# Patient Record
Sex: Female | Born: 1935 | Race: White | Hispanic: No | Smoking: Never smoker
Health system: Southern US, Community
[De-identification: ages and names within clinical notes are randomized; demographics above are authoritative.]

## PROBLEM LIST (undated history)

## (undated) DIAGNOSIS — S62109A Fracture of unspecified carpal bone, unspecified wrist, initial encounter for closed fracture: Secondary | ICD-10-CM

## (undated) DIAGNOSIS — I4891 Unspecified atrial fibrillation: Secondary | ICD-10-CM

## (undated) DIAGNOSIS — I509 Heart failure, unspecified: Secondary | ICD-10-CM

## (undated) DIAGNOSIS — M4854XA Collapsed vertebra, not elsewhere classified, thoracic region, initial encounter for fracture: Secondary | ICD-10-CM

## (undated) DIAGNOSIS — Z66 Do not resuscitate: Secondary | ICD-10-CM

## (undated) DIAGNOSIS — E039 Hypothyroidism, unspecified: Secondary | ICD-10-CM

## (undated) DIAGNOSIS — Z7901 Long term (current) use of anticoagulants: Secondary | ICD-10-CM

## (undated) DIAGNOSIS — H3321 Serous retinal detachment, right eye: Secondary | ICD-10-CM

## (undated) DIAGNOSIS — I2510 Atherosclerotic heart disease of native coronary artery without angina pectoris: Secondary | ICD-10-CM

## (undated) HISTORY — DX: Unspecified atrial fibrillation: I48.91

## (undated) HISTORY — DX: Serous retinal detachment, right eye: H33.21

## (undated) HISTORY — DX: Collapsed vertebra, not elsewhere classified, thoracic region, initial encounter for fracture: M48.54XA

## (undated) HISTORY — DX: Fracture of unspecified carpal bone, unspecified wrist, initial encounter for closed fracture: S62.109A

## (undated) HISTORY — DX: Long term (current) use of anticoagulants: Z79.01

## (undated) HISTORY — PX: RETINAL DETACHMENT SURGERY: SHX105

## (undated) HISTORY — PX: BREAST EXCISIONAL BIOPSY: SUR124

## (undated) HISTORY — PX: BACK SURGERY: SHX140

## (undated) HISTORY — PX: CATARACT EXTRACTION: SUR2

## (undated) HISTORY — PX: SP KYPHOPLASTY: HXRAD454

---

## 1986-06-27 DIAGNOSIS — S62109A Fracture of unspecified carpal bone, unspecified wrist, initial encounter for closed fracture: Secondary | ICD-10-CM

## 1986-06-27 HISTORY — DX: Fracture of unspecified carpal bone, unspecified wrist, initial encounter for closed fracture: S62.109A

## 1986-06-27 HISTORY — PX: ORIF WRIST FRACTURE: SHX2133

## 1998-06-27 HISTORY — PX: COLONOSCOPY: SHX174

## 1999-11-24 ENCOUNTER — Encounter: Admission: RE | Admit: 1999-11-24 | Discharge: 1999-11-24 | Payer: Self-pay | Admitting: Internal Medicine

## 1999-11-24 ENCOUNTER — Encounter: Payer: Self-pay | Admitting: Internal Medicine

## 2000-11-27 ENCOUNTER — Encounter: Payer: Self-pay | Admitting: Internal Medicine

## 2000-11-27 ENCOUNTER — Encounter: Admission: RE | Admit: 2000-11-27 | Discharge: 2000-11-27 | Payer: Self-pay | Admitting: Internal Medicine

## 2001-11-29 ENCOUNTER — Encounter: Payer: Self-pay | Admitting: Internal Medicine

## 2001-11-29 ENCOUNTER — Encounter: Admission: RE | Admit: 2001-11-29 | Discharge: 2001-11-29 | Payer: Self-pay | Admitting: Internal Medicine

## 2002-12-12 ENCOUNTER — Encounter: Payer: Self-pay | Admitting: Internal Medicine

## 2002-12-12 ENCOUNTER — Encounter: Admission: RE | Admit: 2002-12-12 | Discharge: 2002-12-12 | Payer: Self-pay | Admitting: Internal Medicine

## 2003-09-01 ENCOUNTER — Inpatient Hospital Stay (HOSPITAL_COMMUNITY): Admission: RE | Admit: 2003-09-01 | Discharge: 2003-09-04 | Payer: Self-pay | Admitting: Orthopedic Surgery

## 2003-09-04 ENCOUNTER — Inpatient Hospital Stay: Admission: AD | Admit: 2003-09-04 | Discharge: 2003-09-11 | Payer: Self-pay | Admitting: Internal Medicine

## 2003-09-22 ENCOUNTER — Encounter (HOSPITAL_COMMUNITY): Admission: RE | Admit: 2003-09-22 | Discharge: 2003-10-22 | Payer: Self-pay | Admitting: Orthopedic Surgery

## 2004-02-26 ENCOUNTER — Ambulatory Visit (HOSPITAL_COMMUNITY): Admission: RE | Admit: 2004-02-26 | Discharge: 2004-02-26 | Payer: Self-pay | Admitting: Internal Medicine

## 2004-06-27 HISTORY — PX: TOTAL KNEE ARTHROPLASTY: SHX125

## 2005-02-17 ENCOUNTER — Emergency Department (HOSPITAL_COMMUNITY): Admission: EM | Admit: 2005-02-17 | Discharge: 2005-02-17 | Payer: Self-pay | Admitting: Emergency Medicine

## 2005-03-02 ENCOUNTER — Encounter: Admission: RE | Admit: 2005-03-02 | Discharge: 2005-03-02 | Payer: Self-pay | Admitting: Specialist

## 2005-03-03 ENCOUNTER — Encounter: Admission: RE | Admit: 2005-03-03 | Discharge: 2005-03-03 | Payer: Self-pay | Admitting: Specialist

## 2005-03-11 ENCOUNTER — Ambulatory Visit (HOSPITAL_COMMUNITY): Admission: RE | Admit: 2005-03-11 | Discharge: 2005-03-11 | Payer: Self-pay | Admitting: Internal Medicine

## 2005-03-23 ENCOUNTER — Encounter: Admission: RE | Admit: 2005-03-23 | Discharge: 2005-03-23 | Payer: Self-pay | Admitting: Specialist

## 2005-07-19 ENCOUNTER — Encounter (INDEPENDENT_AMBULATORY_CARE_PROVIDER_SITE_OTHER): Payer: Self-pay | Admitting: Internal Medicine

## 2005-12-09 ENCOUNTER — Ambulatory Visit: Payer: Self-pay | Admitting: Internal Medicine

## 2006-01-03 ENCOUNTER — Encounter (INDEPENDENT_AMBULATORY_CARE_PROVIDER_SITE_OTHER): Payer: Self-pay | Admitting: Internal Medicine

## 2006-01-06 ENCOUNTER — Ambulatory Visit: Payer: Self-pay | Admitting: Internal Medicine

## 2006-01-10 ENCOUNTER — Ambulatory Visit: Payer: Self-pay | Admitting: Internal Medicine

## 2006-01-24 ENCOUNTER — Ambulatory Visit: Payer: Self-pay | Admitting: Internal Medicine

## 2006-01-31 ENCOUNTER — Ambulatory Visit: Payer: Self-pay | Admitting: Internal Medicine

## 2006-01-31 LAB — CONVERTED CEMR LAB: aPTT: 18.1 s

## 2006-02-14 ENCOUNTER — Ambulatory Visit: Payer: Self-pay | Admitting: Family Medicine

## 2006-02-14 ENCOUNTER — Encounter (INDEPENDENT_AMBULATORY_CARE_PROVIDER_SITE_OTHER): Payer: Self-pay | Admitting: Internal Medicine

## 2006-02-14 LAB — CONVERTED CEMR LAB: INR: 1.8

## 2006-03-02 ENCOUNTER — Ambulatory Visit: Payer: Self-pay | Admitting: Family Medicine

## 2006-03-02 ENCOUNTER — Encounter (INDEPENDENT_AMBULATORY_CARE_PROVIDER_SITE_OTHER): Payer: Self-pay | Admitting: Internal Medicine

## 2006-03-09 ENCOUNTER — Ambulatory Visit: Payer: Self-pay | Admitting: Family Medicine

## 2006-03-09 ENCOUNTER — Encounter (INDEPENDENT_AMBULATORY_CARE_PROVIDER_SITE_OTHER): Payer: Self-pay | Admitting: Internal Medicine

## 2006-03-15 ENCOUNTER — Encounter (INDEPENDENT_AMBULATORY_CARE_PROVIDER_SITE_OTHER): Payer: Self-pay | Admitting: Internal Medicine

## 2006-03-16 ENCOUNTER — Ambulatory Visit (HOSPITAL_COMMUNITY): Admission: RE | Admit: 2006-03-16 | Discharge: 2006-03-16 | Payer: Self-pay | Admitting: Internal Medicine

## 2006-03-22 ENCOUNTER — Encounter (INDEPENDENT_AMBULATORY_CARE_PROVIDER_SITE_OTHER): Payer: Self-pay | Admitting: Internal Medicine

## 2006-03-31 ENCOUNTER — Ambulatory Visit: Payer: Self-pay | Admitting: Internal Medicine

## 2006-03-31 LAB — CONVERTED CEMR LAB: INR: 2.6

## 2006-04-28 ENCOUNTER — Ambulatory Visit: Payer: Self-pay | Admitting: Internal Medicine

## 2006-05-22 ENCOUNTER — Encounter: Payer: Self-pay | Admitting: Internal Medicine

## 2006-05-22 DIAGNOSIS — M810 Age-related osteoporosis without current pathological fracture: Secondary | ICD-10-CM | POA: Insufficient documentation

## 2006-05-22 DIAGNOSIS — H3320 Serous retinal detachment, unspecified eye: Secondary | ICD-10-CM | POA: Insufficient documentation

## 2006-05-22 DIAGNOSIS — S62109A Fracture of unspecified carpal bone, unspecified wrist, initial encounter for closed fracture: Secondary | ICD-10-CM | POA: Insufficient documentation

## 2006-05-22 DIAGNOSIS — H269 Unspecified cataract: Secondary | ICD-10-CM | POA: Insufficient documentation

## 2006-05-22 DIAGNOSIS — S22000A Wedge compression fracture of unspecified thoracic vertebra, initial encounter for closed fracture: Secondary | ICD-10-CM

## 2006-05-30 ENCOUNTER — Ambulatory Visit: Payer: Self-pay | Admitting: Family Medicine

## 2006-05-30 ENCOUNTER — Encounter (INDEPENDENT_AMBULATORY_CARE_PROVIDER_SITE_OTHER): Payer: Self-pay | Admitting: Internal Medicine

## 2006-06-28 ENCOUNTER — Ambulatory Visit: Payer: Self-pay | Admitting: Internal Medicine

## 2006-08-02 ENCOUNTER — Ambulatory Visit: Payer: Self-pay | Admitting: Internal Medicine

## 2006-08-02 LAB — CONVERTED CEMR LAB: INR: 1.8

## 2006-08-17 ENCOUNTER — Ambulatory Visit: Payer: Self-pay | Admitting: Family Medicine

## 2006-08-17 LAB — CONVERTED CEMR LAB: INR: 2.9

## 2006-08-18 DIAGNOSIS — I4891 Unspecified atrial fibrillation: Secondary | ICD-10-CM

## 2006-09-13 ENCOUNTER — Ambulatory Visit: Payer: Self-pay | Admitting: Internal Medicine

## 2006-09-13 LAB — CONVERTED CEMR LAB
Hemoglobin: 12.3 g/dL
INR: 2.4
Prothrombin Time: 18.8 s

## 2006-10-12 ENCOUNTER — Ambulatory Visit: Payer: Self-pay | Admitting: Internal Medicine

## 2006-10-12 LAB — CONVERTED CEMR LAB: INR: 2.4

## 2006-11-09 ENCOUNTER — Ambulatory Visit: Payer: Self-pay | Admitting: Internal Medicine

## 2006-12-07 ENCOUNTER — Ambulatory Visit: Payer: Self-pay | Admitting: Internal Medicine

## 2006-12-07 LAB — CONVERTED CEMR LAB
INR: 1.9
Prothrombin Time: 17 s

## 2006-12-22 ENCOUNTER — Ambulatory Visit: Payer: Self-pay | Admitting: Family Medicine

## 2007-01-19 ENCOUNTER — Ambulatory Visit: Payer: Self-pay | Admitting: Internal Medicine

## 2007-01-19 LAB — CONVERTED CEMR LAB
INR: 2.4
Prothrombin Time: 18.8 s

## 2007-02-16 ENCOUNTER — Ambulatory Visit: Payer: Self-pay | Admitting: Internal Medicine

## 2007-02-16 LAB — CONVERTED CEMR LAB: Prothrombin Time: 18 s

## 2007-03-16 ENCOUNTER — Telehealth (INDEPENDENT_AMBULATORY_CARE_PROVIDER_SITE_OTHER): Payer: Self-pay | Admitting: *Deleted

## 2007-03-30 ENCOUNTER — Ambulatory Visit: Payer: Self-pay | Admitting: Internal Medicine

## 2007-03-30 DIAGNOSIS — R252 Cramp and spasm: Secondary | ICD-10-CM | POA: Insufficient documentation

## 2007-03-30 LAB — CONVERTED CEMR LAB
INR: 1.7
Prothrombin Time: 15.8 s

## 2007-04-02 ENCOUNTER — Telehealth (INDEPENDENT_AMBULATORY_CARE_PROVIDER_SITE_OTHER): Payer: Self-pay | Admitting: *Deleted

## 2007-04-02 LAB — CONVERTED CEMR LAB
Basophils Absolute: 0 10*3/uL (ref 0.0–0.1)
Calcium: 8.9 mg/dL (ref 8.4–10.5)
Glucose, Bld: 84 mg/dL (ref 70–99)
HCT: 39.1 % (ref 36.0–46.0)
Lymphocytes Relative: 32 % (ref 12–46)
Lymphs Abs: 1.9 10*3/uL (ref 0.7–3.3)
MCHC: 32.2 g/dL (ref 30.0–36.0)
Monocytes Absolute: 0.7 10*3/uL (ref 0.2–0.7)
Neutro Abs: 3.2 10*3/uL (ref 1.7–7.7)
Platelets: 263 10*3/uL (ref 150–400)
RBC: 4.03 M/uL (ref 3.87–5.11)

## 2007-04-05 ENCOUNTER — Ambulatory Visit (HOSPITAL_COMMUNITY): Admission: RE | Admit: 2007-04-05 | Discharge: 2007-04-05 | Payer: Self-pay | Admitting: Internal Medicine

## 2007-04-05 ENCOUNTER — Encounter (INDEPENDENT_AMBULATORY_CARE_PROVIDER_SITE_OTHER): Payer: Self-pay | Admitting: Internal Medicine

## 2007-04-06 ENCOUNTER — Ambulatory Visit: Payer: Self-pay | Admitting: Internal Medicine

## 2007-04-06 LAB — CONVERTED CEMR LAB: INR: 1.2

## 2007-04-20 ENCOUNTER — Ambulatory Visit: Payer: Self-pay | Admitting: Internal Medicine

## 2007-04-20 LAB — CONVERTED CEMR LAB
INR: 1.8
Prothrombin Time: 16.7 s

## 2007-04-29 ENCOUNTER — Encounter (INDEPENDENT_AMBULATORY_CARE_PROVIDER_SITE_OTHER): Payer: Self-pay | Admitting: Internal Medicine

## 2007-05-04 ENCOUNTER — Ambulatory Visit: Payer: Self-pay | Admitting: Internal Medicine

## 2007-05-04 LAB — CONVERTED CEMR LAB
INR: 2.6
Prothrombin Time: 19.7 s

## 2007-05-30 ENCOUNTER — Ambulatory Visit: Payer: Self-pay | Admitting: Internal Medicine

## 2007-05-30 LAB — CONVERTED CEMR LAB: INR: 2.4

## 2007-06-29 ENCOUNTER — Ambulatory Visit: Payer: Self-pay | Admitting: Internal Medicine

## 2007-07-20 ENCOUNTER — Ambulatory Visit: Payer: Self-pay | Admitting: Internal Medicine

## 2007-07-20 ENCOUNTER — Telehealth (INDEPENDENT_AMBULATORY_CARE_PROVIDER_SITE_OTHER): Payer: Self-pay | Admitting: Internal Medicine

## 2007-07-20 LAB — CONVERTED CEMR LAB: Prothrombin Time: 48.5 s — ABNORMAL HIGH (ref 11.6–15.2)

## 2007-08-03 ENCOUNTER — Ambulatory Visit: Payer: Self-pay | Admitting: Internal Medicine

## 2007-08-31 ENCOUNTER — Ambulatory Visit: Payer: Self-pay | Admitting: Internal Medicine

## 2007-08-31 LAB — CONVERTED CEMR LAB: Prothrombin Time: 18.2 s

## 2007-09-12 ENCOUNTER — Ambulatory Visit: Payer: Self-pay | Admitting: Family Medicine

## 2007-09-12 DIAGNOSIS — S0010XA Contusion of unspecified eyelid and periocular area, initial encounter: Secondary | ICD-10-CM | POA: Insufficient documentation

## 2007-09-28 ENCOUNTER — Ambulatory Visit: Payer: Self-pay | Admitting: Internal Medicine

## 2007-09-28 LAB — CONVERTED CEMR LAB
INR: 1.9
Prothrombin Time: 16.8 s

## 2007-10-19 ENCOUNTER — Ambulatory Visit: Payer: Self-pay | Admitting: Internal Medicine

## 2007-10-19 LAB — CONVERTED CEMR LAB: INR: 1.4

## 2007-11-02 ENCOUNTER — Ambulatory Visit: Payer: Self-pay | Admitting: Internal Medicine

## 2007-11-02 LAB — CONVERTED CEMR LAB: INR: 3.4

## 2007-11-16 ENCOUNTER — Telehealth (INDEPENDENT_AMBULATORY_CARE_PROVIDER_SITE_OTHER): Payer: Self-pay | Admitting: *Deleted

## 2007-11-22 ENCOUNTER — Ambulatory Visit: Payer: Self-pay | Admitting: Internal Medicine

## 2007-11-22 LAB — CONVERTED CEMR LAB: INR: 2.3

## 2007-12-17 ENCOUNTER — Ambulatory Visit: Payer: Self-pay | Admitting: Internal Medicine

## 2007-12-17 DIAGNOSIS — J069 Acute upper respiratory infection, unspecified: Secondary | ICD-10-CM | POA: Insufficient documentation

## 2007-12-21 ENCOUNTER — Ambulatory Visit: Payer: Self-pay | Admitting: Internal Medicine

## 2007-12-21 LAB — CONVERTED CEMR LAB
Hemoglobin: 13.2 g/dL
INR: 2.1

## 2008-01-17 ENCOUNTER — Ambulatory Visit: Payer: Self-pay | Admitting: Family Medicine

## 2008-01-31 ENCOUNTER — Ambulatory Visit: Payer: Self-pay | Admitting: Internal Medicine

## 2008-01-31 LAB — CONVERTED CEMR LAB
INR: 3
Prothrombin Time: 21 s

## 2008-02-15 ENCOUNTER — Ambulatory Visit: Payer: Self-pay | Admitting: Internal Medicine

## 2008-02-15 DIAGNOSIS — N390 Urinary tract infection, site not specified: Secondary | ICD-10-CM

## 2008-02-15 LAB — CONVERTED CEMR LAB
Glucose, Urine, Semiquant: NEGATIVE
Ketones, urine, test strip: NEGATIVE
Nitrite: POSITIVE
Protein, U semiquant: NEGATIVE
Specific Gravity, Urine: 1.015
Urobilinogen, UA: 0.2
pH: 5.5

## 2008-02-22 ENCOUNTER — Ambulatory Visit: Payer: Self-pay | Admitting: Internal Medicine

## 2008-02-22 DIAGNOSIS — T887XXA Unspecified adverse effect of drug or medicament, initial encounter: Secondary | ICD-10-CM | POA: Insufficient documentation

## 2008-03-07 ENCOUNTER — Ambulatory Visit: Payer: Self-pay | Admitting: Internal Medicine

## 2008-03-20 ENCOUNTER — Ambulatory Visit: Payer: Self-pay | Admitting: Internal Medicine

## 2008-03-20 LAB — CONVERTED CEMR LAB: INR: 3.2

## 2008-03-21 ENCOUNTER — Encounter (INDEPENDENT_AMBULATORY_CARE_PROVIDER_SITE_OTHER): Payer: Self-pay | Admitting: Internal Medicine

## 2008-04-03 ENCOUNTER — Ambulatory Visit: Payer: Self-pay | Admitting: Internal Medicine

## 2008-04-08 ENCOUNTER — Encounter (INDEPENDENT_AMBULATORY_CARE_PROVIDER_SITE_OTHER): Payer: Self-pay | Admitting: Internal Medicine

## 2008-04-24 ENCOUNTER — Ambulatory Visit: Payer: Self-pay | Admitting: Internal Medicine

## 2008-04-24 LAB — CONVERTED CEMR LAB: INR: 3.6

## 2008-04-25 ENCOUNTER — Encounter (INDEPENDENT_AMBULATORY_CARE_PROVIDER_SITE_OTHER): Payer: Self-pay | Admitting: Internal Medicine

## 2008-05-15 ENCOUNTER — Ambulatory Visit: Payer: Self-pay | Admitting: Internal Medicine

## 2008-05-15 LAB — CONVERTED CEMR LAB: INR: 2.7

## 2008-06-12 ENCOUNTER — Ambulatory Visit: Payer: Self-pay | Admitting: Internal Medicine

## 2008-07-10 ENCOUNTER — Ambulatory Visit: Payer: Self-pay | Admitting: Internal Medicine

## 2008-07-10 LAB — CONVERTED CEMR LAB: INR: 2.9

## 2008-07-17 ENCOUNTER — Ambulatory Visit: Payer: Self-pay | Admitting: Internal Medicine

## 2008-07-17 DIAGNOSIS — M25569 Pain in unspecified knee: Secondary | ICD-10-CM

## 2008-07-18 ENCOUNTER — Telehealth (INDEPENDENT_AMBULATORY_CARE_PROVIDER_SITE_OTHER): Payer: Self-pay | Admitting: *Deleted

## 2008-08-07 ENCOUNTER — Ambulatory Visit: Payer: Self-pay | Admitting: Internal Medicine

## 2008-08-07 LAB — CONVERTED CEMR LAB
INR: 3.8
Nitrite: NEGATIVE
Specific Gravity, Urine: 1.01
WBC Urine, dipstick: NEGATIVE

## 2008-08-08 ENCOUNTER — Encounter (INDEPENDENT_AMBULATORY_CARE_PROVIDER_SITE_OTHER): Payer: Self-pay | Admitting: Internal Medicine

## 2008-08-08 DIAGNOSIS — R300 Dysuria: Secondary | ICD-10-CM | POA: Insufficient documentation

## 2008-08-21 ENCOUNTER — Ambulatory Visit: Payer: Self-pay | Admitting: Internal Medicine

## 2008-09-15 ENCOUNTER — Ambulatory Visit: Payer: Self-pay | Admitting: Internal Medicine

## 2008-09-15 LAB — CONVERTED CEMR LAB: INR: 2.9

## 2008-10-16 ENCOUNTER — Ambulatory Visit: Payer: Self-pay | Admitting: Internal Medicine

## 2008-10-16 LAB — CONVERTED CEMR LAB: INR: 2.6

## 2008-11-13 ENCOUNTER — Ambulatory Visit: Payer: Self-pay | Admitting: Internal Medicine

## 2008-11-17 ENCOUNTER — Encounter (INDEPENDENT_AMBULATORY_CARE_PROVIDER_SITE_OTHER): Payer: Self-pay | Admitting: Internal Medicine

## 2008-12-18 ENCOUNTER — Ambulatory Visit: Payer: Self-pay | Admitting: Internal Medicine

## 2009-01-15 ENCOUNTER — Ambulatory Visit: Payer: Self-pay | Admitting: Internal Medicine

## 2009-01-15 LAB — CONVERTED CEMR LAB: INR: 3.5

## 2009-01-22 ENCOUNTER — Encounter (INDEPENDENT_AMBULATORY_CARE_PROVIDER_SITE_OTHER): Payer: Self-pay | Admitting: Internal Medicine

## 2010-07-18 ENCOUNTER — Encounter: Payer: Self-pay | Admitting: Internal Medicine

## 2010-08-18 ENCOUNTER — Ambulatory Visit (HOSPITAL_COMMUNITY)
Admission: RE | Admit: 2010-08-18 | Discharge: 2010-08-18 | Disposition: A | Discharge status: Home / Self Care | Payer: Medicare Other | Source: Ambulatory Visit | Attending: Internal Medicine | Admitting: Internal Medicine

## 2010-08-18 ENCOUNTER — Other Ambulatory Visit (HOSPITAL_COMMUNITY): Payer: Self-pay | Admitting: Internal Medicine

## 2010-08-18 DIAGNOSIS — W19XXXA Unspecified fall, initial encounter: Secondary | ICD-10-CM

## 2010-08-18 DIAGNOSIS — M949 Disorder of cartilage, unspecified: Secondary | ICD-10-CM | POA: Insufficient documentation

## 2010-08-18 DIAGNOSIS — M79609 Pain in unspecified limb: Secondary | ICD-10-CM | POA: Insufficient documentation

## 2010-08-18 DIAGNOSIS — R918 Other nonspecific abnormal finding of lung field: Secondary | ICD-10-CM | POA: Insufficient documentation

## 2010-08-18 DIAGNOSIS — M899 Disorder of bone, unspecified: Secondary | ICD-10-CM | POA: Insufficient documentation

## 2010-08-18 DIAGNOSIS — M25519 Pain in unspecified shoulder: Secondary | ICD-10-CM | POA: Insufficient documentation

## 2010-10-14 ENCOUNTER — Ambulatory Visit (INDEPENDENT_AMBULATORY_CARE_PROVIDER_SITE_OTHER): Payer: Medicare Other | Admitting: Cardiology

## 2010-10-14 ENCOUNTER — Encounter: Payer: Self-pay | Admitting: Cardiology

## 2010-10-14 VITALS — BP 123/59 | HR 110 | Ht 64.0 in | Wt 131.0 lb

## 2010-10-14 DIAGNOSIS — Z7901 Long term (current) use of anticoagulants: Secondary | ICD-10-CM

## 2010-10-14 DIAGNOSIS — R2681 Unsteadiness on feet: Secondary | ICD-10-CM | POA: Insufficient documentation

## 2010-10-14 DIAGNOSIS — R42 Dizziness and giddiness: Secondary | ICD-10-CM

## 2010-10-14 DIAGNOSIS — R269 Unspecified abnormalities of gait and mobility: Secondary | ICD-10-CM

## 2010-10-14 DIAGNOSIS — I4891 Unspecified atrial fibrillation: Secondary | ICD-10-CM

## 2010-10-14 NOTE — Assessment & Plan Note (Signed)
Patient has done well with full anticoagulation and has a significant risk for thromboembolism in the absence of anticoagulation, but is a suboptimal candidate at present due to her advanced age and multiple falls.  I would like to diagnose the reason for her gait problems and determine how much could be done to prevent falls before declaring that she is not a candidate for safe anticoagulation.  For now, warfarin will be continued.

## 2010-10-14 NOTE — Progress Notes (Signed)
HPI:  Kim [name] is a remarkably sharp and delightful 77 year old woman referred by Dr. Catalina Pizza for evaluation of dizziness.  This nice woman has enjoyed generally excellent health.  She has had atrial fibrillation for a number of years, has been maintained on anticoagulants, but has not apparently previously been evaluated by a cardiologist nor undergone any significant cardiac testing.  She experiences no palpitations, dyspnea, chest discomfort, pedal edema, orthopnea or PND.  Heart rate has been well-controlled with a modest dose of beta blocker.  Anticoagulation with warfarin has been stable and therapeutic without complications.  Unfortunately, she has suffered a number of falls in recent months.  In each case, she recalls tripping or losing her balance, denying any loss of consciousness.  Nonetheless, she describes "dizziness" , which is poorly characterized.  She best describes this as a sense of heaviness in her head that typically occurs when she is standing or walking, but is not always present.  Her gait is unsteady, and she now walks with a cane.  She has arthritis in her lower extremities, but has no known neurologic problems.  She apparently has osteoporosis and has suffered at least one compression vertebral fracture requiring kyphoplasty performed by one of the radiologists.  Current Outpatient Prescriptions on File Prior to Visit  Medication Sig Dispense Refill  . Ascorbic Acid (VITAMIN C) 500 MG tablet Take 500 mg by mouth daily.        . chlorpheniramine-HYDROcodone (TUSSIONEX PENNKINETIC ER) 10-8 MG/5ML LQCR Take 5 mLs by mouth.        . metoprolol (TOPROL-XL) 50 MG 24 hr tablet Take 50 mg by mouth daily. Take 1/2 tab daily       . Multiple Vitamins-Minerals (MULTIVITAMIN WITH MINERALS) tablet Take 1 tablet by mouth daily.        Marland Kitchen warfarin (COUMADIN) 3 MG tablet Take 3 mg by mouth daily.        Marland Kitchen DISCONTD: nitrofurantoin, macrocrystal-monohydrate, (MACROBID) 100 MG capsule Take  100 mg by mouth 2 (two) times daily.        Marland Kitchen DISCONTD: zoster vaccine live, PF, (ZOSTAVAX) 16109 UNT/0.65ML injection Inject 0.65 mLs into the skin once.          Allergies  Allergen Reactions  . Nitrofurantoin     REACTION: rash      Past Medical History  Diagnosis Date  . Atrial fibrillation   . Osteoporosis     Knee; hip  . Wrist fracture 1988    Left  . Detached retina, right   . Chronic anticoagulation   . Compression fracture of thoracic spine, non-traumatic      Past Surgical History  Procedure Date  . Kyphosis surgery     radiographic procedure with bone cement  . Retinal detachment surgery   . Cataract extraction     bilaterial  . Total knee arthroplasty 2006    Left  . Breast excisional biopsy 1954; 1968    Benign disease  . Orif wrist fracture 1988  . Colonoscopy 2000     Family History  Problem Relation Age of Onset  . Heart disease Father     deceased age 50  . Heart attack Mother      History   Social History  . Marital Status: Married    Spouse Name: N/A    Number of Children: N/A  . Years of Education: N/A   Occupational History  . retail sales     retired   Social History Main  Topics  . Smoking status: Never Smoker   . Smokeless tobacco: Never Used  . Alcohol Use: No  . Drug Use: No  . Sexually Active:    Other Topics Concern  . Not on file   Social History Narrative  . No narrative on file     ROS: Patient requires corrective lenses and has undergone bilateral cataract extractions.  She follows no dietary restrictions and has stable weight and appetite.  She notes occasional mild ankle edema.   All other systems reviewed and are negative.  PHYSICAL EXAM: BP 123/59  Pulse 110  Ht 5\' 4"  (1.626 m)  Wt 131 lb (59.421 kg)  BMI 22.49 kg/m2  SpO2 97% ; no significant orthostatic change in blood pressure General-Well-developed; no acute distress; proportionate weight and height HEENT-[place]/AT; PERRL; EOM intact; conjunctiva and  lids nl Neck-No JVD; no carotid bruits Endocrine-No thyromegaly Lungs-No tachypnea, clear without rales, rhonchi or wheezes Cardiovascular- normal PMI; normal S1 and S2; minimal systolic murmur; irregular rhythm Abdomen-BS normal; soft and non-tender without masses or organomegaly Musculoskeletal-No deformities, cyanosis or clubbing Neurologic-Nl cranial nerves; slight weakness of the left lower extremity; non-fluid gait with shuffling of her feet and very small steps; negative Romberg Skin- Warm, no sig. Lesions Extremities-Nl distal pulses; no edema  EKG:   Atrial fibrillation with a controlled ventricular response; heart rate is 95 bpm; right ventricular conduction delay; minor nonspecific ST-T wave abnormality; no previous tracing for comparison.  ASSESSMENT AND PLAN:

## 2010-10-14 NOTE — Assessment & Plan Note (Signed)
Arrhythmias long standing and apparently does not reflect underlying structural heart disease.  An echocardiogram and TSH level will be obtained.

## 2010-10-14 NOTE — Patient Instructions (Signed)
Your physician recommends that you schedule a follow-up appointment in: 1 month *Your physician recommends that you return for lab work in: today Stool cards for home: please follow instructions  In the packet MRI of brain for dizziness Physical therapy-for gait training

## 2010-10-15 LAB — CBC WITH DIFFERENTIAL/PLATELET
Basophils Absolute: 0 10*3/uL (ref 0.0–0.1)
Eosinophils Relative: 2 % (ref 0–5)
HCT: 37.1 % (ref 36.0–46.0)
Lymphocytes Relative: 24 % (ref 12–46)
MCH: 31.9 pg (ref 26.0–34.0)
MCHC: 33.2 g/dL (ref 30.0–36.0)
MCV: 96.1 fL (ref 78.0–100.0)
Monocytes Absolute: 0.7 10*3/uL (ref 0.1–1.0)
RDW: 14.4 % (ref 11.5–15.5)
WBC: 7.3 10*3/uL (ref 4.0–10.5)

## 2010-10-15 LAB — COMPREHENSIVE METABOLIC PANEL
AST: 24 U/L (ref 0–37)
BUN: 18 mg/dL (ref 6–23)
CO2: 26 mEq/L (ref 19–32)
Calcium: 9.6 mg/dL (ref 8.4–10.5)
Chloride: 99 mEq/L (ref 96–112)
Creat: 0.78 mg/dL (ref 0.40–1.20)

## 2010-10-18 ENCOUNTER — Other Ambulatory Visit (HOSPITAL_COMMUNITY): Payer: Medicare Other

## 2010-10-20 ENCOUNTER — Ambulatory Visit (HOSPITAL_COMMUNITY)
Admission: RE | Admit: 2010-10-20 | Discharge: 2010-10-20 | Disposition: A | Discharge status: Home / Self Care | Payer: Medicare Other | Source: Ambulatory Visit | Attending: Cardiology | Admitting: Cardiology

## 2010-10-20 DIAGNOSIS — Z7901 Long term (current) use of anticoagulants: Secondary | ICD-10-CM | POA: Insufficient documentation

## 2010-10-20 DIAGNOSIS — R42 Dizziness and giddiness: Secondary | ICD-10-CM

## 2010-10-20 DIAGNOSIS — Z96659 Presence of unspecified artificial knee joint: Secondary | ICD-10-CM | POA: Insufficient documentation

## 2010-10-22 ENCOUNTER — Encounter: Payer: Self-pay | Admitting: *Deleted

## 2010-10-22 ENCOUNTER — Telehealth: Payer: Self-pay | Admitting: *Deleted

## 2010-10-22 ENCOUNTER — Telehealth: Payer: Self-pay | Admitting: Cardiology

## 2010-10-22 NOTE — Telephone Encounter (Signed)
Patient would like results of "brain scan" done on Tuesday / tg

## 2010-10-22 NOTE — Telephone Encounter (Signed)
Verbalized understanding

## 2010-10-22 NOTE — Telephone Encounter (Signed)
Results called to pt, verbalized understanding

## 2010-10-22 NOTE — Telephone Encounter (Signed)
Results to pt

## 2010-10-22 NOTE — Progress Notes (Signed)
Results called to pt

## 2010-10-22 NOTE — Progress Notes (Signed)
Result letter mailed to pt.

## 2010-10-25 ENCOUNTER — Encounter (INDEPENDENT_AMBULATORY_CARE_PROVIDER_SITE_OTHER): Payer: Medicare Other

## 2010-10-25 DIAGNOSIS — R195 Other fecal abnormalities: Secondary | ICD-10-CM

## 2010-11-12 NOTE — H&P (Signed)
NAMEMarland [name]  Kim, [name]                         ACCOUNT NO.:  [id number]   MEDICAL RECORD NO.:  [id number]                   PATIENT TYPE:  INP   LOCATION:  0477                                 FACILITY:  [facility]   PHYSICIAN:  Ollen Gross, M.D.                 DATE OF BIRTH:  [birth year]   DATE OF ADMISSION:  09/01/2003  DATE OF DISCHARGE:                                HISTORY & PHYSICAL   CHIEF COMPLAINT:  Left knee pain.   HISTORY OF PRESENT ILLNESS:  The patient is an 77 year old female seen by  Dr. Lequita Halt for ongoing progressive history of left knee pain and  dysfunction.  She does not recall any specific injury or accident.  She has  had some occasional discomfort over the past couple of years, however, over  the past six to nine months it has become much more progressive.  She is at  a stage now where she is having pain all the time and even at night.  She  has undergone Cortisone injections which have not provided any relief.  She  was seen in the office where x-rays show bone-on-bone changes of the medial  compartment, as well as patellofemoral compartment.  She is felt to have end-  stage arthritis and has been refractory to non-operative management.  Felt  she would benefit from undergoing surgical intervention.  Risks and benefits  discussed.  The patient is subsequently admitted to the hospital.   ALLERGIES:  No known drug allergies.   CURRENT MEDICATIONS:  1. Toprol XL 50 mg 1/2 tablet daily.  2. Coumadin 3.5 mg Monday, Wednesday, and Friday, 3 mg on Tuesday, Thursday,     Saturday, and Sunday.   PAST MEDICAL HISTORY:  1. Heart arrhythmia.  2. Chronic Coumadin therapy.  3. A left wrist fracture back in 1988.   PAST SURGICAL HISTORY:  1. Breast lumpectomy x2 in 1954 and 1968, both were benign.  2. Left wrist surgery in 1998.   SOCIAL HISTORY:  Married, retired, nonsmoker, no alcohol.  Her husband will  be assisting with her care after surgery.   FAMILY  HISTORY:  Significant for heart disease in both of her parents.   REVIEW OF SYSTEMS:  GENERAL:  No fevers, chills, night sweats.  NEUROLOGIC:  No seizures, syncope, or paralysis.  RESPIRATORY:  No shortness of breath,  hemoptysis, or productive cough.  CARDIOVASCULAR:  No chest pain, angina, or  orthopnea.  GASTROINTESTINAL:  No nausea, vomiting, diarrhea, constipation.  GENITOURINARY:  She does have some occasional nocturia, no dysuria,  hematuria, or discharge.  MUSCULOSKELETAL:  Pertinent to that of the knee  found in the history of present illness.   PHYSICAL EXAMINATION:  VITAL SIGNS:  Pulse 84, respirations 12, blood  pressure 118/62.  GENERAL:  An 77 year old white female, well-developed, well-nourished, in no  acute distress.  She is alert, oriented, and cooperative, very pleasant,  accompanied by  her husband preoperatively.  HEENT:  Normocephalic, atraumatic.  Pupils equal, round, reactive.  Oropharynx clear.  NECK:  Supple, no carotid bruits.  CHEST:  Clear anterior and posterior chest walls to auscultation.  No  rhonchi, rales.  HEART:  Regular rate and rhythm with an occasional skipped beat versus a  short interval or irregular rhythm.  No murmurs.  ABDOMEN:  Soft, nontender, bowel sounds present.  RECTAL:  Not done, not pertinent to present illness.  BREASTS:  Not done, not pertinent to present illness.  GENITOURINARY:  Not done, not pertinent to present illness.  EXTREMITIES:  Significant to that of the left knee and leg.  She is noted to  have a varus deformity on exam.  Range of motion of 5 to 95 degrees.  Marked  crepitus is noted.  No instability.  She is tender medial and lateral joint  lines.   IMPRESSION:  1. Osteoarthritis, left knee.  2. Heart arrhythmia.  3. Chronic Coumadin therapy.   PLAN:  The patient will be admitted to [facility] to undergo a  left total knee replacement arthroplasty.  Surgery will be performed by Dr.  Ollen Gross.  Her  medical doctor is Dr. Lendell Caprice.  Dr. Lendell Caprice will be  notified and consulted if needed for any medical assistance with the patient  throughout the hospital course.     Alexzandrew L. Julien Girt, P.A.              Ollen Gross, M.D.    ALP/MEDQ  D:  09/02/2003  T:  09/03/2003  Job:  [id number]   cc:   Janae Bridgeman. Eloise Harman., M.D.  [address]  [place]  [place] [address]  Fax: [phone]

## 2010-11-12 NOTE — Op Note (Signed)
NAME:  Kim [name], Kim [name]                         ACCOUNT NO.:  [id number]   MEDICAL RECORD NO.:  [id number]                   PATIENT TYPE:  INP   LOCATION:  X010                                 FACILITY:  [facility]   PHYSICIAN:  Ollen Gross, M.D.                 DATE OF BIRTH:  Jul 06, 1917   DATE OF PROCEDURE:  09/01/2003  DATE OF DISCHARGE:                                 OPERATIVE REPORT   PREOPERATIVE DIAGNOSIS:  Osteoarthritis of the left knee.   POSTOPERATIVE DIAGNOSIS:  Osteoarthritis of the left knee.   PROCEDURE:  Left total knee arthroplasty.   SURGEON:  Ollen Gross, M.D.   ASSISTANT:  Alexzandrew L. Julien Girt, P.A.   ANESTHESIA:  General with postoperative Marcaine pain pump.   ESTIMATED BLOOD LOSS:  Minimal.   DRAINS:  Hemovac x1.   TOURNIQUET TIME:  43 minutes at 300 mmHg.   COMPLICATIONS:  None.   CONDITION:  Stable to recovery.   BRIEF CLINICAL NOTE:  The patient is an 77 year old female with severe end  stage osteoarthritis of the left knee with pain refractory to nonoperative  management.  She was then scheduled for a left total knee arthroplasty.   DESCRIPTION OF PROCEDURE:  After the successful administration of general  anesthetic the tourniquet was placed on the left thigh and the left lower  extremity prepped and draped in the usual sterile fashion.  The extremity  was wrapped with an Esmarch, the knee flexed and the tourniquet inflated to  300 mmHg.  A standard midline incision was made with a 10 blade through  subcutaneous tissue to the level of the extensor mechanism.  A fresh blade  was used to make a medial parapatellar arthrotomy and the soft tissue of the  proximal and medial tibia subperiosteally elevated the joint line with the  knife. We extended this with a curved osteotome.  The soft tissue over the  proximal and lateral tibia was elevated with attention being paid to the  window of the patellar tendon on the tibial tubercle.  The patella  was  everted and the knee flexed to 90 degrees and the ACL and PCL were removed.  A drill was used to create a starting hole in the distal femur.  The canal  was irrigated and the valgus alignment guide was placed.  The area  traversing off the posterior condylar rotation was marked and a block  pinned to remove 10 mm of the distal femur.  The distal femoral resection  was made with an oscillating saw.  The sizing block was placed. A size 4 was  most appropriate for the femur. An AP cutting block was placed and the  anterior and posterior cuts were made for a size 4.   The tibia was subluxed forward and the menisci were removed.  The  extramedullary tibial alignment guide was placed referencing proximally at  the medial aspect of the  tibial tubercle and distally along the second  metatarsal axis and tibial breadth.  The block was then pinned to remove 10  mm off the non-deficient lateral side.  The tibial resection was made with  an oscillating saw.  It looked like a size 3 would be the most appropriate  tibial size.  The femoral preparation was completed with the intercondylar  chamfer cuts.  The trial sized 4, a posterior stabilized femur with a size 3  fixed Barring tibial tray and a 10 mm posterior stabilizer fixed Barring  inserter placed.  Full extension was achieved with excellent varus and  valgus balance throughout.  The knee was placed through a range of motion  and the rotation was marked.  The tibial preparation was then completed with  the Kiel punch and modular drill.  The patella was everted and the thickness  measured to be 23 mm, free hand resection was taken to 13 mm and the 38  template is placed, lug holes were drilled, the trial patella was placed and  it tracked normally.  The osteophytes were then removed off the posterior  femur with the trials in place.  All trials were removed and the cut bone  surfaces were prepared with pulsatile lavage.  The cement was mixed  and once  ready for implantation the size 3 fixed Barring tibial tray, size 4  posterior stabilized femur and a 38 patella were cemented in place.  The  patella was held with a clamp.  10 mm fixed Barring trial nuts were placed  and the knee was held in full extension.  All extruded cement was removed.  Once the cement was fully hardened, then a permanent 10 mm posterior  stabilizer fixed Barring size 3 insert was placed into the tibial tray.  The  wound was then copiously irrigated with antibiotic solution and the extensor  mechanism closed over a Hemovac drain with interrupted #1 PDS.  The  tourniquet was then released for a total time of 43 minutes.  Inflation anti-  gravity was 135 degrees.  The patella tracks normally.  The subcutaneous was  then closed with interrupted 2-0 Vicryl and running 4-0 subcuticular.  The  incision was cleaned and dried and Steri-Strips and a bulky sterile dressing  were applied.  The patient was then awakened and transported to recovery in  stable condition.                                               Ollen Gross, M.D.    FA/MEDQ  D:  09/01/2003  T:  09/01/2003  Job:  [id number]

## 2010-11-12 NOTE — Discharge Summary (Signed)
NAMEMarland Kitchen  MYSTIC, LABO                         ACCOUNT NO.:  [id number]   MEDICAL RECORD NO.:  [id number]                   PATIENT TYPE:  INP   LOCATION:  0477                                 [facility]ACILITY:  [facility]   PHYSICIAN:  Ollen Gross, M.D.                 DATE OF BIRTH:  [birth year]   DATE OF ADMISSION:  09/01/2003  DATE OF DISCHARGE:  09/04/2003                                 DISCHARGE SUMMARY   ADMISSION DIAGNOSES:  1. Osteoarthritis, left knee.  2. Chronic trial fibrillation with Coumadin therapy.   DISCHARGE DIAGNOSES:  1. Osteoarthritis, left knee, status post left total knee replacement     arthroplasty.  2. Postoperative hyponatremia.  3. Mild postoperative blood loss anemia, did not require transfusion.  4. Chronic atrial fibrillation with Coumadin therapy.   PROCEDURE:  Date of surgery September 01, 2003, left total knee arthroplasty.  Surgeon Ollen Gross, M.D., assistant Alexzandrew L. Perkins, P.A.-C.  Anesthesia general with postoperative Marcaine pain pump.  Minimal blood  loss.  Hemovac drain x1.  Tourniquet time 43 minutes at 300 mmHg.   CONSULTATIONS:  None.   BRIEF HISTORY:  An 77 year old female with known history of severe end-stage  arthritis of the left knee that has been refractory to nonoperative  management.  It was felt she would benefit from undergoing surgical  intervention.  Risks and benefits discussed.  The patient was subsequently  admitted to the hospital.   LABORATORY DATA:  CBC on admission:  Hemoglobin 13.5, hematocrit 39.2, white  cell count 7.3, red cell count 4.2, differential within normal limits.  Postoperative H&H 10.5 and 30.4.  Last noted H&H of 9.1 and 26.4.  PT/PTT  preop 23.0 and 39, respectively, and INR 2.8.  She was on Coumadin  preoperatively.  It was rechecked the morning of surgery and PT/INR was 14.5  and 1.2.  Placed back on her Coumadin.  Last protime INR 18.7 and 1.9 by  time of transfer.  Chem panel on admission all  within normal limits.  Sodium  did drop down from 138 to 132, last noted 130.  Calcium dropped from 9.9 to  8.1.  Preop UA:  Small blood, rare epithelial cell, otherwise negative.  Blood group type O positive.  Preop chest x-ray dated June 28, 2003:  Scattered pulmonary scarring with no acute findings.  Preop EKG August 07, 2003:  Atrial fibrillation, early transition, nonspecific diffuse ST and T-  wave abnormalities, confirmed by Dr. Lendell Caprice.   HOSPITAL COURSE:  The patient was admitted to [facility] and taken  to the OR, underwent the above-stated procedure without complication.  The  patient tolerated the procedure well, taken to the recovery room and then to  the orthopedic floor for continued postop care.  The patient was placed on  PCA analgesia for pain control after surgery, given 24 hours of postop  antibiotics in the form of Ancef,  placed on Coumadin.  She was also placed  on Lovenox until the in which an INR was 2.0 or greater.  She did receive  concomitant Lovenox therapy with her Coumadin, placed back on her Toprol  home medication.  She was placed to weightbearing as tolerated.  PT and OT  were consulted to assist with gait training, ambulation, and ADLs.  Rehab  consult was ordered but through the course of the hospital the patient  wanted to look into the nursing rehab center at [facility], and therefore  discharge planning was consulted.  The Hemovac drain that had been placed at  the time of surgery was pulled on postop day 1.  Dressing change was  initiated on postop day 2.  The pain pump was removed on postop day 2.  From  a therapy standpoint she was actually progressing very well.  By day 2 she  was already getting up and walking 150 feet with minimal assist.  Discharge  planning contacted [facility].  It was found that the patient had a bed  available on September 04, 2003, which is postop day 3.  She did have a little  bit of difficulty voiding and some  urinary retention on postop day 2, so the  Foley had to be reinserted, had been removed earlier that morning.  On day 3  the Foley was removed with a trial voiding trial, which is also the morning  in which a bed was confirmed by [facility].  At this time the patient is  doing well, undergoing a voiding trial, and we will set up transfer to [facility] rehab center.   DISCHARGE PLAN:  Patient to transfer over to [facility] re[facility]  on September 04, 2003.   DISCHARGE DIAGNOSES:  Please see above.   DISCHARGE MEDICATIONS:  1. Colace 100 mg p.o. b.i.d.  2. Coumadin as per DVT prophylaxis, target INR 2.0-3.0, for a total of     Coumadin four three weeks, then she may resume her previous preoperative     Coumadin dosage for chronic atrial fibrillation.  3. Toprol XL 50 mg daily.  4. Senokot S p.o. p.r.n.  5. Laxative of choice.  6. Enema of choice.  7. Percocet one or two every four to six hours p.r.n. for pain.  8. Tylenol one or two every four to six hours as needed for mild pain.  9. Robaxin 500 mg p.o. q.6-8h. p.r.n. spasm.  10.      Benadryl 25 mg p.o. q.h.s. p.r.n. sleep.   DIET:  As tolerated.   ACTIVITY:  She is weightbearing as tolerated.  She needs to continue with  total knee protocol.  PT and OT for gait training, ambulation, and ADLs.  Need to be up ambulating twice a day.  Encourage range of motion and  strengthening exercises.  Please note:  Please do not allow the patient to  put a pillow behind her knee to keep her knee flexed.  No pillow behind the  knee.  The pillow may be used to elevate the leg by being under the foot and  ankle only.  Ice p.r.n.   FOLLOW-UP:  The patient will follow up in the office two weeks from surgery.  Please contact the office at [phone], to set up an appointment time and  transfer the patient approximately two weeks following the date of surgery.   DISPOSITION:  [facility].  CONDITION ON DISCHARGE:  Improved.     Alexzandrew L. Julien Girt, P.A.              Ollen Gross, M.D.    ALP/MEDQ  D:  09/04/2003  T:  09/04/2003  Job:  [id number]   cc:   Janae Bridgeman. Eloise Harman., M.D.  [address]  [place]  [place] [address]  Fax: [phone]   [facility] (with patient)

## 2010-11-17 ENCOUNTER — Encounter: Payer: Self-pay | Admitting: Cardiology

## 2010-11-17 ENCOUNTER — Ambulatory Visit (INDEPENDENT_AMBULATORY_CARE_PROVIDER_SITE_OTHER): Payer: Medicare Other | Admitting: Cardiology

## 2010-11-17 DIAGNOSIS — R269 Unspecified abnormalities of gait and mobility: Secondary | ICD-10-CM

## 2010-11-17 DIAGNOSIS — R2681 Unsteadiness on feet: Secondary | ICD-10-CM

## 2010-11-17 DIAGNOSIS — I4891 Unspecified atrial fibrillation: Secondary | ICD-10-CM

## 2010-11-17 NOTE — Patient Instructions (Signed)
**Note De-identified Simrit Gohlke Obfuscation** Your physician recommends that you continue on your current medications as directed. Please refer to the Current Medication list given to you today.  Your physician recommends that you schedule a follow-up appointment in: as needed  

## 2010-11-17 NOTE — Assessment & Plan Note (Addendum)
Patient remains stable with respect to her arrhythmia with no symptoms, excellent control of heart rate and appropriate chronic anticoagulation.  Dr. Margo Aye is managing all aspects of this problem with excellent results.

## 2010-11-17 NOTE — Assessment & Plan Note (Addendum)
Her problems with gait have resolved even though she was never evaluated by physical therapy.  The course of her illness is most suggestive of vertigo originating in the inner ear and now resolved.  No further testing or treatment is warranted.  Kim [name] does not appear to require routine cardiology followup, but I would be more than happy to reassess this nice woman at any time Dr. Margo Aye deems appropriate.

## 2010-11-17 NOTE — Progress Notes (Signed)
HPI : Ms. Scogin returns to the office as scheduled for continued assessment and treatment of dizziness and instability of gait.  Subsequent to her previous visit, all of her symptoms resolved.  She now feels perfectly well and back to normal.  An MRI study of her brain was negative except for age-related changes.  Stool Hemoccults were negative.  Recent laboratory included a TSH level and CBC have been normal.    Current Outpatient Prescriptions on File Prior to Visit  Medication Sig Dispense Refill  . Ascorbic Acid (VITAMIN C) 500 MG tablet Take 500 mg by mouth daily.        . Calcium Carbonate (CALTRATE 600 PO) Take 1 capsule by mouth 2 (two) times daily.        . celecoxib (CELEBREX) 100 MG capsule Take 100 mg by mouth daily.        . fish oil-omega-3 fatty acids 1000 MG capsule Take 1 g by mouth 2 (two) times daily.        . fluticasone (VERAMYST) 27.5 MCG/SPRAY nasal spray 2 sprays by Nasal route daily.        . metoprolol (TOPROL-XL) 50 MG 24 hr tablet Take 50 mg by mouth daily. Take 1/2 tab daily       . Multiple Vitamins-Minerals (MULTIVITAMIN WITH MINERALS) tablet Take 1 tablet by mouth daily.        . Potassium Bicarbonate 99 MG CAPS Take 1 capsule by mouth daily.        Marland Kitchen warfarin (COUMADIN) 3 MG tablet Take 3 mg by mouth daily.        Marland Kitchen DISCONTD: chlorpheniramine-HYDROcodone (TUSSIONEX PENNKINETIC ER) 10-8 MG/5ML LQCR Take 5 mLs by mouth.           Allergies  Allergen Reactions  . Nitrofurantoin     REACTION: rash      Past medical history, social history, and family history reviewed and updated.   PHYSICAL EXAM: BP 139/69  Pulse 77  Ht 5\' 4"  (1.626 m)  Wt 134 lb (60.782 kg)  BMI 23.00 kg/m2  SpO2 98%  General-Well developed; no acute distress Body habitus-proportionate weight and height Neck-No JVD, no carotid bruits Lungs: clear lung fields; normal I:E ratio Cardiovascular-normal PMI; normal S1 and normally split S2 Abdomen-normal bowel sounds; soft and non-tender  without masses or organomegaly Skin-Warm, no significant lesions Extremities-Nl distal pulses; 1/2+ left ankle edema, trace on the right  ASSESSMENT AND PLAN:

## 2011-05-12 ENCOUNTER — Ambulatory Visit (HOSPITAL_COMMUNITY)
Admission: RE | Admit: 2011-05-12 | Discharge: 2011-05-12 | Disposition: A | Discharge status: Home / Self Care | Payer: Medicare Other | Source: Ambulatory Visit | Attending: Internal Medicine | Admitting: Internal Medicine

## 2011-05-12 ENCOUNTER — Other Ambulatory Visit (HOSPITAL_COMMUNITY): Payer: Self-pay | Admitting: Internal Medicine

## 2011-05-12 DIAGNOSIS — S99929A Unspecified injury of unspecified foot, initial encounter: Secondary | ICD-10-CM | POA: Insufficient documentation

## 2011-05-12 DIAGNOSIS — M25569 Pain in unspecified knee: Secondary | ICD-10-CM | POA: Insufficient documentation

## 2011-05-12 DIAGNOSIS — W19XXXA Unspecified fall, initial encounter: Secondary | ICD-10-CM

## 2011-05-12 DIAGNOSIS — S8990XA Unspecified injury of unspecified lower leg, initial encounter: Secondary | ICD-10-CM | POA: Insufficient documentation

## 2011-06-02 ENCOUNTER — Other Ambulatory Visit (HOSPITAL_COMMUNITY): Payer: Self-pay | Admitting: Internal Medicine

## 2011-06-02 ENCOUNTER — Ambulatory Visit (HOSPITAL_COMMUNITY)
Admission: RE | Admit: 2011-06-02 | Discharge: 2011-06-02 | Disposition: A | Discharge status: Home / Self Care | Payer: Medicare Other | Source: Ambulatory Visit | Attending: Internal Medicine | Admitting: Internal Medicine

## 2011-06-02 DIAGNOSIS — S0990XA Unspecified injury of head, initial encounter: Secondary | ICD-10-CM | POA: Insufficient documentation

## 2011-06-02 DIAGNOSIS — R51 Headache: Secondary | ICD-10-CM | POA: Insufficient documentation

## 2011-06-02 DIAGNOSIS — Z7901 Long term (current) use of anticoagulants: Secondary | ICD-10-CM | POA: Insufficient documentation

## 2011-06-02 DIAGNOSIS — W1809XA Striking against other object with subsequent fall, initial encounter: Secondary | ICD-10-CM | POA: Insufficient documentation

## 2011-06-02 DIAGNOSIS — W19XXXA Unspecified fall, initial encounter: Secondary | ICD-10-CM

## 2011-06-02 DIAGNOSIS — R42 Dizziness and giddiness: Secondary | ICD-10-CM | POA: Insufficient documentation

## 2011-11-24 ENCOUNTER — Encounter: Payer: Self-pay | Admitting: Orthopedic Surgery

## 2011-11-24 ENCOUNTER — Ambulatory Visit (INDEPENDENT_AMBULATORY_CARE_PROVIDER_SITE_OTHER): Payer: Medicare Other | Admitting: Orthopedic Surgery

## 2011-11-24 ENCOUNTER — Ambulatory Visit (INDEPENDENT_AMBULATORY_CARE_PROVIDER_SITE_OTHER): Payer: Medicare Other

## 2011-11-24 VITALS — BP 110/60 | Ht 64.0 in | Wt 134.0 lb

## 2011-11-24 DIAGNOSIS — M25569 Pain in unspecified knee: Secondary | ICD-10-CM

## 2011-11-24 MED ORDER — TRAMADOL-ACETAMINOPHEN 37.5-325 MG PO TABS: 1.0000 | ORAL_TABLET | ORAL | Status: AC | PRN

## 2011-11-24 NOTE — Patient Instructions (Signed)
You have received a steroid shot. 15% of patients experience increased pain at the injection site with in the next 24 hours. This is best treated with ice and tylenol extra strength 2 tabs every 8 hours. If you are still having pain please call the office.   Wear brace and pick up prescription for pain medication

## 2011-11-24 NOTE — Progress Notes (Signed)
Subjective:     Patient ID: Kim [name], female   DOB: 05-Feb-1918, 78 y.o.   MRN: [id number]  Knee Pain  Incident onset: GRADUAL ONSET OF PAIN FOR SEVERAL YEARS  Incident location: NONE. There was no injury mechanism. The pain is present in the right knee. Quality: THROBBING. The pain is at a severity of 8/10. The pain is severe. The pain has been fluctuating since onset. Associated symptoms include a loss of motion. Pertinent negatives include no muscle weakness, numbness or tingling. Associated symptoms comments: NO MECHANICAL SYMPTOMS.   Past Medical History  Diagnosis Date  . Atrial fibrillation   . Osteoporosis     Knee; hip  . Wrist fracture 1988    Left  . Detached retina, right   . Chronic anticoagulation   . Compression fracture of thoracic spine, non-traumatic     Past Surgical History  Procedure Date  . Sp kyphoplasty     Percutaneous  . Retinal detachment surgery   . Cataract extraction     Bilateral  . Total knee arthroplasty 2006    Left  . Breast excisional biopsy 1954; 1968    Benign disease  . Orif wrist fracture 1988  . Colonoscopy 2000  . Back surgery       Review of Systems  Constitutional: Negative.   HENT: Negative.   Eyes: Negative.   Respiratory: Negative.   Cardiovascular: Negative.   Gastrointestinal: Negative.   Genitourinary: Positive for frequency.  Musculoskeletal: Positive for joint swelling.  Neurological: Negative.  Negative for tingling and numbness.       UNSTEADY GAIT  Hematological: Negative.   Psychiatric/Behavioral: Negative.        Objective:   Physical Exam  Constitutional: She is oriented to person, place, and time. She appears well-developed and well-nourished.  HENT:  Head: Normocephalic and atraumatic.  Neck: Neck supple. No tracheal deviation present.  Cardiovascular: Intact distal pulses.   Musculoskeletal: Normal range of motion.       Right shoulder: Normal.       Left shoulder: Normal.       Right knee:  She exhibits swelling, effusion and deformity. She exhibits normal meniscus and no MCL laxity. tenderness found. Medial joint line tenderness noted. No lateral joint line, no MCL, no LCL and no patellar tendon tenderness noted.       Left knee: Normal.       LEFT total knee arthroplasty with no tenderness or swelling. Range of motion normal, stability, and strength, normal. Skin incision healed normal.  Neurological: She is alert and oriented to person, place, and time. She displays normal reflexes. No cranial nerve deficit. She exhibits normal muscle tone. Coordination normal.  Skin: Skin is warm and dry.  Psychiatric: She has a normal mood and affect. Her behavior is normal. Judgment and thought content normal.  AMBULATE WITH CANE      Assessment:     Osteoarthritis RIGHT knee. Patient and the patient's age, we have decided to try a brace, and injection, and Ultracet for pain with a followup scheduled for reevaluation Knee  Injection Procedure Note  Pre-operative Diagnosis: left knee oa  Post-operative Diagnosis: same  Indications: pain  Anesthesia: ethyl chloride   Procedure Details   Verbal consent was obtained for the procedure. Time out was completed.The joint was prepped with alcohol, followed by  Ethyl chloride spray and A 20 gauge needle was inserted into the knee via lateral approach; 4ml 1% lidocaine and 1 ml of depomedrol  was  then injected into the joint . The needle was removed and the area cleansed and dressed.  Complications:  None; patient tolerated the procedure well.     Plan:     As stated

## 2012-01-25 ENCOUNTER — Ambulatory Visit: Payer: Medicare Other | Admitting: Orthopedic Surgery

## 2012-02-01 ENCOUNTER — Ambulatory Visit (INDEPENDENT_AMBULATORY_CARE_PROVIDER_SITE_OTHER): Payer: Medicare Other | Admitting: Orthopedic Surgery

## 2012-02-01 ENCOUNTER — Encounter: Payer: Self-pay | Admitting: Orthopedic Surgery

## 2012-02-01 VITALS — BP 92/60 | Ht 64.0 in | Wt 134.0 lb

## 2012-02-01 DIAGNOSIS — M25561 Pain in right knee: Secondary | ICD-10-CM

## 2012-02-01 DIAGNOSIS — M25569 Pain in unspecified knee: Secondary | ICD-10-CM

## 2012-02-01 NOTE — Patient Instructions (Signed)
activities as tolerated 

## 2012-02-01 NOTE — Progress Notes (Signed)
Subjective:     Patient ID: Kim [name], female   DOB: 1917/10/05, 78 y.o.   MRN: [id number] Chief Complaint  Patient presents with  . Follow-up    re eval right knee    HPI Status post injection and brief immobilization of the right knee for right knee pain  Known osteoarthritis on x-ray  Reports improvement of symptoms.  Review of Systems Denies locking catching or giving way    Objective:   Physical Exam Physical Exam(6) GENERAL: normal development   CDV: pulses are normal   Skin: normal  Psychiatric: awake, alert and oriented  Neuro: normal sensation  MSK ambulation is normal without assistive device 1 inspection no tenderness or swelling 2 range of motion is normal 3 muscle tone normal 4 joint stability normal       Assessment:     Stable osteoarthritis with symptoms improved    Plan:     As needed

## 2012-09-26 ENCOUNTER — Ambulatory Visit (HOSPITAL_COMMUNITY)
Admission: RE | Admit: 2012-09-26 | Discharge: 2012-09-26 | Disposition: A | Discharge status: Home / Self Care | Payer: Medicare Other | Source: Ambulatory Visit | Attending: Internal Medicine | Admitting: Internal Medicine

## 2012-09-26 ENCOUNTER — Other Ambulatory Visit (HOSPITAL_COMMUNITY): Payer: Self-pay | Admitting: Internal Medicine

## 2012-09-26 DIAGNOSIS — M5380 Other specified dorsopathies, site unspecified: Secondary | ICD-10-CM | POA: Insufficient documentation

## 2012-09-26 DIAGNOSIS — M549 Dorsalgia, unspecified: Secondary | ICD-10-CM

## 2012-09-26 DIAGNOSIS — M51379 Other intervertebral disc degeneration, lumbosacral region without mention of lumbar back pain or lower extremity pain: Secondary | ICD-10-CM | POA: Insufficient documentation

## 2012-09-26 DIAGNOSIS — M5137 Other intervertebral disc degeneration, lumbosacral region: Secondary | ICD-10-CM | POA: Insufficient documentation

## 2012-09-26 DIAGNOSIS — M47817 Spondylosis without myelopathy or radiculopathy, lumbosacral region: Secondary | ICD-10-CM | POA: Insufficient documentation

## 2012-09-26 DIAGNOSIS — M545 Low back pain, unspecified: Secondary | ICD-10-CM | POA: Insufficient documentation

## 2012-09-29 ENCOUNTER — Emergency Department (HOSPITAL_COMMUNITY)
Admission: EM | Admit: 2012-09-29 | Discharge: 2012-09-29 | Disposition: A | Discharge status: Home / Self Care | Payer: Medicare Other | Attending: Emergency Medicine | Admitting: Emergency Medicine

## 2012-09-29 ENCOUNTER — Emergency Department (HOSPITAL_COMMUNITY): Payer: Medicare Other

## 2012-09-29 ENCOUNTER — Encounter (HOSPITAL_COMMUNITY): Payer: Self-pay

## 2012-09-29 DIAGNOSIS — I4891 Unspecified atrial fibrillation: Secondary | ICD-10-CM | POA: Insufficient documentation

## 2012-09-29 DIAGNOSIS — Z79899 Other long term (current) drug therapy: Secondary | ICD-10-CM | POA: Insufficient documentation

## 2012-09-29 DIAGNOSIS — Z8781 Personal history of (healed) traumatic fracture: Secondary | ICD-10-CM | POA: Insufficient documentation

## 2012-09-29 DIAGNOSIS — M546 Pain in thoracic spine: Secondary | ICD-10-CM | POA: Insufficient documentation

## 2012-09-29 DIAGNOSIS — Z7901 Long term (current) use of anticoagulants: Secondary | ICD-10-CM | POA: Insufficient documentation

## 2012-09-29 DIAGNOSIS — M810 Age-related osteoporosis without current pathological fracture: Secondary | ICD-10-CM | POA: Insufficient documentation

## 2012-09-29 LAB — COMPREHENSIVE METABOLIC PANEL
AST: 21 U/L (ref 0–37)
Albumin: 3.3 g/dL — ABNORMAL LOW (ref 3.5–5.2)
Chloride: 92 mEq/L — ABNORMAL LOW (ref 96–112)
Creatinine, Ser: 0.7 mg/dL (ref 0.50–1.10)
Total Bilirubin: 0.5 mg/dL (ref 0.3–1.2)
Total Protein: 7.6 g/dL (ref 6.0–8.3)

## 2012-09-29 LAB — PROTIME-INR
INR: 3.89 — ABNORMAL HIGH (ref 0.00–1.49)
Prothrombin Time: 35.8 seconds — ABNORMAL HIGH (ref 11.6–15.2)

## 2012-09-29 LAB — CBC WITH DIFFERENTIAL/PLATELET
Basophils Absolute: 0 10*3/uL (ref 0.0–0.1)
Basophils Relative: 0 % (ref 0–1)
Eosinophils Absolute: 0.1 10*3/uL (ref 0.0–0.7)
HCT: 34.4 % — ABNORMAL LOW (ref 36.0–46.0)
MCHC: 34 g/dL (ref 30.0–36.0)
Monocytes Absolute: 1.1 10*3/uL — ABNORMAL HIGH (ref 0.1–1.0)
Neutro Abs: 4.8 10*3/uL (ref 1.7–7.7)
Neutrophils Relative %: 67 % (ref 43–77)
RDW: 14.2 % (ref 11.5–15.5)

## 2012-09-29 LAB — URINALYSIS, ROUTINE W REFLEX MICROSCOPIC
Leukocytes, UA: NEGATIVE
Nitrite: NEGATIVE
Protein, ur: NEGATIVE mg/dL
Specific Gravity, Urine: 1.015 (ref 1.005–1.030)
Urobilinogen, UA: 0.2 mg/dL (ref 0.0–1.0)

## 2012-09-29 LAB — URINE MICROSCOPIC-ADD ON

## 2012-09-29 MED ORDER — MORPHINE SULFATE 4 MG/ML IJ SOLN: 4.0000 mg | Freq: Once | INTRAMUSCULAR | Status: AC

## 2012-09-29 MED ORDER — IOHEXOL 300 MG/ML  SOLN
100.0000 mL | Freq: Once | INTRAMUSCULAR | Status: AC | PRN
  Administered 2012-09-29: 100 mL via INTRAVENOUS

## 2012-09-29 MED ORDER — OXYCODONE-ACETAMINOPHEN 5-325 MG PO TABS: 1.0000 | ORAL_TABLET | ORAL | Status: DC | PRN

## 2012-09-29 MED ORDER — ONDANSETRON HCL 4 MG/2ML IJ SOLN
INTRAMUSCULAR | Status: AC
  Administered 2012-09-29: 4 mg via INTRAVENOUS
  Filled 2012-09-29: qty 2

## 2012-09-29 MED ORDER — ONDANSETRON HCL 4 MG/2ML IJ SOLN: 4.0000 mg | Freq: Once | INTRAMUSCULAR | Status: AC

## 2012-09-29 MED ORDER — MORPHINE SULFATE 4 MG/ML IJ SOLN
INTRAMUSCULAR | Status: AC
  Administered 2012-09-29: 4 mg via INTRAVENOUS
  Filled 2012-09-29: qty 1

## 2012-09-29 MED ORDER — IOHEXOL 300 MG/ML  SOLN
50.0000 mL | Freq: Once | INTRAMUSCULAR | Status: AC | PRN
  Administered 2012-09-29: 50 mL via ORAL

## 2012-09-29 MED ORDER — SODIUM CHLORIDE 0.9 % IV SOLN
INTRAVENOUS | Status: DC
  Administered 2012-09-29: 09:00:00 via INTRAVENOUS

## 2012-09-29 NOTE — ED Provider Notes (Addendum)
History    This chart was scribed for Carleene Cooper III, MD by Charolett Bumpers, ED Scribe. The patient was seen in room [facility]. Patient's care was started at 08:24.   CSN: [id number]  Arrival date & time 09/29/12  0820   First MD Initiated Contact with Patient 09/29/12 [phone]      Chief Complaint  Patient presents with  . Abdominal Pain    The history is provided by the patient. No language interpreter was used.   Kim [name] is a 79 y.o. female who presents to the Emergency Department complaining of 8 days of constant, moderate right sided abdominal pain with associated right sided back pain with muscle spasms. She has taken Hydrocodone with some relief of the pain. She states that her bowels have been regular, but has had some trouble since taking the Hydrocodone. She denies any recent injuries or trauma. She denies any modifying of her pain with eating. She denies any fevers, nausea, vomiting, diarrhea or constipation. She was seen by her PCP, Dr. Dwana Melena, on 4/2 and had x-rays of lumbar spine and right chest. She states she was called yesterday and told to come to ED for evaluation for possible issues with her gallbladder. She states that she is otherwise normally healthy and is on Coumadin therapy for atrial fibrillation. She also has a h/o a non-traumatic compression fracture of the thoracic spine.   Past Medical History  Diagnosis Date  . Atrial fibrillation   . Osteoporosis     Knee; hip  . Wrist fracture 1988    Left  . Detached retina, right   . Chronic anticoagulation   . Compression fracture of thoracic spine, non-traumatic     Past Surgical History  Procedure Laterality Date  . Sp kyphoplasty      Percutaneous  . Retinal detachment surgery    . Cataract extraction      Bilateral  . Total knee arthroplasty  2006    Left  . Breast excisional biopsy  1954; 1968    Benign disease  . Orif wrist fracture  1988  . Colonoscopy  2000  . Back surgery       Family History  Problem Relation Age of Onset  . Heart disease Father     deceased age 46  . Heart attack Mother   . Arthritis      History  Substance Use Topics  . Smoking status: Never Smoker   . Smokeless tobacco: Never Used  . Alcohol Use: No    OB History   Grav Para Term Preterm Abortions TAB SAB Ect Mult Living                  Review of Systems  Constitutional: Negative for fever.  Gastrointestinal: Positive for abdominal pain. Negative for nausea, vomiting, diarrhea and constipation.  Musculoskeletal: Positive for back pain.  All other systems reviewed and are negative.    Allergies  Nitrofurantoin  Home Medications   Current Outpatient Rx  Name  Route  Sig  Dispense  Refill  . Ascorbic Acid (VITAMIN C) 500 MG tablet   Oral   Take 500 mg by mouth daily.           . Calcium Carbonate (CALTRATE 600 PO)   Oral   Take 1 capsule by mouth 2 (two) times daily.           . carvedilol (COREG) 3.125 MG tablet   Oral   Take 3.125 mg by  mouth 2 (two) times daily with a meal.           . celecoxib (CELEBREX) 100 MG capsule   Oral   Take 100 mg by mouth daily.           Marland Kitchen desloratadine (CLARINEX) 5 MG tablet   Oral   Take 5 mg by mouth daily.           . fish oil-omega-3 fatty acids 1000 MG capsule   Oral   Take 1 g by mouth 2 (two) times daily.           . fluticasone (VERAMYST) 27.5 MCG/SPRAY nasal spray   Nasal   2 sprays by Nasal route daily.           . metoprolol (TOPROL-XL) 50 MG 24 hr tablet   Oral   Take 50 mg by mouth daily. Take 1/2 tab daily          . Multiple Vitamins-Minerals (MULTIVITAMIN WITH MINERALS) tablet   Oral   Take 1 tablet by mouth daily.           . Potassium Bicarbonate 99 MG CAPS   Oral   Take 1 capsule by mouth daily.           Marland Kitchen warfarin (COUMADIN) 3 MG tablet   Oral   Take 3 mg by mouth daily.             Triage Vitals: BP 121/72  Pulse 90  Temp(Src) 98.3 F (36.8 C) (Oral)   Resp 18  Ht 5\' 3"  (1.6 m)  Wt 129 lb (58.514 kg)  BMI 22.86 kg/m2  SpO2 100%  Physical Exam  Nursing note and vitals reviewed. Constitutional: She is oriented to person, place, and time. She appears well-developed and well-nourished. No distress.  HENT:  Head: Normocephalic and atraumatic.  Right Ear: Tympanic membrane, external ear and ear canal normal.  Left Ear: Tympanic membrane, external ear and ear canal normal.  Nose: Nose normal.  Mouth/Throat: Oropharynx is clear and moist. No oropharyngeal exudate.  Eyes: Conjunctivae and EOM are normal. Pupils are equal, round, and reactive to light.  Neck: Normal range of motion. Neck supple. No tracheal deviation present.  Cardiovascular: Normal rate, regular rhythm and normal heart sounds.   Pulmonary/Chest: Effort normal and breath sounds normal. No respiratory distress.  Abdominal: Soft. Bowel sounds are normal. She exhibits no distension and no mass. There is no tenderness. There is no rebound and no guarding.  Abdomen is non-tender.   Musculoskeletal: Normal range of motion. She exhibits tenderness. She exhibits no edema.  Tenderness in the right lumbar region.   Neurological: She is alert and oriented to person, place, and time.  Skin: Skin is warm and dry.  Psychiatric: She has a normal mood and affect. Her behavior is normal.    ED Course  Procedures (including critical care time)  DIAGNOSTIC STUDIES: Oxygen Saturation is 100% on room air, normal by my interpretation.    COORDINATION OF CARE:  8:53 AM-Discussed planned course of treatment with the patient including a CT of her abdomen/pelvis, blood work and UA, who is agreeable at this time. She states that she took her Hydrocodone this morning around 7 am and states that she does not need pain medication at this time.   9:13-Per nurse, pt is now requesting pain medication. Pt states that she has increased pain with movement and has been SOB all week as well with getting out  of the bed.  Will order pain and nausea medication.    Date: 09/29/2012  Rate: 88  Rhythm: atrial fibrillation  QRS Axis: normal  Intervals: normal  ST/T Wave abnormalities: nonspecific ST/T changes  Conduction Disutrbances:none  Narrative Interpretation: Abnormal EKG  Old EKG Reviewed: none available  Results for orders placed during the hospital encounter of 09/29/12  CBC WITH DIFFERENTIAL      Result Value Range   WBC 7.1  4.0 - 10.5 K/uL   RBC 3.66 (*) 3.87 - 5.11 MIL/uL   Hemoglobin 11.7 (*) 12.0 - 15.0 g/dL   HCT 95.6 (*) 21.3 - 08.6 %   MCV 94.0  78.0 - 100.0 fL   MCH 32.0  26.0 - 34.0 pg   MCHC 34.0  30.0 - 36.0 g/dL   RDW 57.8  46.9 - 62.9 %   Platelets 284  150 - 400 K/uL   Neutrophils Relative 67  43 - 77 %   Neutro Abs 4.8  1.7 - 7.7 K/uL   Lymphocytes Relative 16  12 - 46 %   Lymphs Abs 1.2  0.7 - 4.0 K/uL   Monocytes Relative 15 (*) 3 - 12 %   Monocytes Absolute 1.1 (*) 0.1 - 1.0 K/uL   Eosinophils Relative 2  0 - 5 %   Eosinophils Absolute 0.1  0.0 - 0.7 K/uL   Basophils Relative 0  0 - 1 %   Basophils Absolute 0.0  0.0 - 0.1 K/uL  COMPREHENSIVE METABOLIC PANEL      Result Value Range   Sodium 129 (*) 135 - 145 mEq/L   Potassium 3.8  3.5 - 5.1 mEq/L   Chloride 92 (*) 96 - 112 mEq/L   CO2 27  19 - 32 mEq/L   Glucose, Bld 114 (*) 70 - 99 mg/dL   BUN 19  6 - 23 mg/dL   Creatinine, Ser 5.28  0.50 - 1.10 mg/dL   Calcium 9.4  8.4 - 41.3 mg/dL   Total Protein 7.6  6.0 - 8.3 g/dL   Albumin 3.3 (*) 3.5 - 5.2 g/dL   AST 21  0 - 37 U/L   ALT 12  0 - 35 U/L   Alkaline Phosphatase 75  39 - 117 U/L   Total Bilirubin 0.5  0.3 - 1.2 mg/dL   GFR calc non Af Amer 72 (*) >90 mL/min   GFR calc Af Amer 84 (*) >90 mL/min  APTT      Result Value Range   aPTT 51 (*) 24 - 37 seconds  PROTIME-INR      Result Value Range   Prothrombin Time 35.8 (*) 11.6 - 15.2 seconds   INR 3.89 (*) 0.00 - 1.49  URINALYSIS, ROUTINE W REFLEX MICROSCOPIC      Result Value Range   Color,  Urine YELLOW  YELLOW   APPearance CLEAR  CLEAR   Specific Gravity, Urine 1.015  1.005 - 1.030   pH 6.0  5.0 - 8.0   Glucose, UA NEGATIVE  NEGATIVE mg/dL   Hgb urine dipstick MODERATE (*) NEGATIVE   Bilirubin Urine NEGATIVE  NEGATIVE   Ketones, ur NEGATIVE  NEGATIVE mg/dL   Protein, ur NEGATIVE  NEGATIVE mg/dL   Urobilinogen, UA 0.2  0.0 - 1.0 mg/dL   Nitrite NEGATIVE  NEGATIVE   Leukocytes, UA NEGATIVE  NEGATIVE  URINE MICROSCOPIC-ADD ON      Result Value Range   Squamous Epithelial / LPF FEW (*) RARE   WBC, UA 0-2  <3 WBC/hpf  RBC / HPF 3-6  <3 RBC/hpf    Ct Abdomen Pelvis W Contrast  09/29/2012  *RADIOLOGY REPORT*  Clinical Data: Abdominal pain.  History of chronic anticoagulation for atrial fibrillation and prior spinal compression fractures.  CT ABDOMEN AND PELVIS WITH CONTRAST  Technique:  Multidetector CT imaging of the abdomen and pelvis was performed following the standard protocol during bolus administration of intravenous contrast.  Contrast: 50mL OMNIPAQUE IOHEXOL 300 MG/ML SOLN, OMNIPAQUE IOHEXOL 300 MG/ML  SOLN  Comparison: Lumbar spine radiographs on 09/26/2012.  Findings: The liver, gallbladder, pancreas, spleen and adrenal glands are unremarkable.  Both kidneys show mild prominence of the collecting systems without delayed excretion of contrast or visible obstructive process.  The ureters and bladder are unremarkable.  No renal calculi are identified.  The stomach is moderately distended with ingested oral contrast. Some oral contrast is present in the small bowel.  There is no evidence of bowel obstruction or inflammation.  No free fluid or abscess is identified.  No evidence of mass or enlarged lymph nodes.  Tortuosity and atherosclerosis of the abdominal aorta present without aneurysm.  There are bilateral prominent pelvic veins surrounding the uterus and consistent with pelvic varicosities.  Associated large left gonadal vein identified.  Bony structures show  methylmethacrylate in the T12 vertebral body related to prior vertebral augmentation.  Chronic mild endplate fractures of L2 and L3 are identified. Significant spondylosis present at L4-5.  IMPRESSION:  1. Mild prominence of bilateral renal collecting systems without obstructive process identified.  This is likely a normal variant given normal renal function. 2.  Nonspecific distention of the stomach without obvious inflammation, mass or gastric outlet abnormality by CT. 3.  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   Original Report Authenticated By: Irish Lack, M.D.     12:29 PM-Recheck: Informed pt of imaging and lab results. She states that her pain has improved in ED, but still has some pain. Will d/c home with prescription for Percocet and have f/u with PCP in the office. Advised no heavy lifting.    1. Discogenic thoracic pain     I personally performed the services described in this documentation, which was scribed in my presence. The recorded information has been reviewed and is accurate.  Osvaldo Human, M.D.   Carleene Cooper III, MD 09/29/12 1241     Carleene Cooper III, MD 09/29/12 [phone]

## 2012-09-29 NOTE — ED Notes (Signed)
Pt ambulatory with stand by assist and with use of cane to restroom with very slow and steady gait, unable to provide urine sample.

## 2012-09-29 NOTE — ED Notes (Signed)
The patient states that she has noticed that she has been getting short of breath more than usual since she started having this new pain in her back, MD made aware of this new complaint.

## 2012-09-29 NOTE — ED Notes (Signed)
Pt reports right side ab pain/right side back pain for 1 week. Was seen x 3 by her pmd for the same, and had xray yesterday and told by pmd to come to er yesterday to come for eval. Pt denies any recent injury or other symptoms besides pain.

## 2012-09-29 NOTE — ED Notes (Signed)
Patient states that she started having right lower back pain about 1 week ago.  States that the pain slowly progressed upward toward her right lower rib cage border posteriorly.  States that she is feeling more weak than usual since this pain started about 1 week ago.

## 2013-08-13 ENCOUNTER — Emergency Department (HOSPITAL_COMMUNITY): Payer: Medicare Other

## 2013-08-13 ENCOUNTER — Encounter (HOSPITAL_COMMUNITY): Payer: Self-pay | Admitting: Emergency Medicine

## 2013-08-13 ENCOUNTER — Emergency Department (HOSPITAL_COMMUNITY)
Admission: EM | Admit: 2013-08-13 | Discharge: 2013-08-13 | Disposition: A | Discharge status: Home / Self Care | Payer: Medicare Other | Attending: Emergency Medicine | Admitting: Emergency Medicine

## 2013-08-13 DIAGNOSIS — S1093XA Contusion of unspecified part of neck, initial encounter: Principal | ICD-10-CM

## 2013-08-13 DIAGNOSIS — Z7901 Long term (current) use of anticoagulants: Secondary | ICD-10-CM | POA: Insufficient documentation

## 2013-08-13 DIAGNOSIS — Y9389 Activity, other specified: Secondary | ICD-10-CM | POA: Insufficient documentation

## 2013-08-13 DIAGNOSIS — W1809XA Striking against other object with subsequent fall, initial encounter: Secondary | ICD-10-CM | POA: Insufficient documentation

## 2013-08-13 DIAGNOSIS — Y929 Unspecified place or not applicable: Secondary | ICD-10-CM | POA: Insufficient documentation

## 2013-08-13 DIAGNOSIS — Z8781 Personal history of (healed) traumatic fracture: Secondary | ICD-10-CM | POA: Insufficient documentation

## 2013-08-13 DIAGNOSIS — T148XXA Other injury of unspecified body region, initial encounter: Secondary | ICD-10-CM

## 2013-08-13 DIAGNOSIS — I4891 Unspecified atrial fibrillation: Secondary | ICD-10-CM | POA: Insufficient documentation

## 2013-08-13 DIAGNOSIS — Z8739 Personal history of other diseases of the musculoskeletal system and connective tissue: Secondary | ICD-10-CM | POA: Insufficient documentation

## 2013-08-13 DIAGNOSIS — Z79899 Other long term (current) drug therapy: Secondary | ICD-10-CM | POA: Insufficient documentation

## 2013-08-13 DIAGNOSIS — S0003XA Contusion of scalp, initial encounter: Secondary | ICD-10-CM | POA: Insufficient documentation

## 2013-08-13 DIAGNOSIS — IMO0002 Reserved for concepts with insufficient information to code with codable children: Secondary | ICD-10-CM | POA: Insufficient documentation

## 2013-08-13 DIAGNOSIS — W19XXXA Unspecified fall, initial encounter: Secondary | ICD-10-CM

## 2013-08-13 DIAGNOSIS — S0083XA Contusion of other part of head, initial encounter: Principal | ICD-10-CM

## 2013-08-13 DIAGNOSIS — Z8669 Personal history of other diseases of the nervous system and sense organs: Secondary | ICD-10-CM | POA: Insufficient documentation

## 2013-08-13 LAB — PROTIME-INR
INR: 3.28 — AB (ref 0.00–1.49)
PROTHROMBIN TIME: 32.2 s — AB (ref 11.6–15.2)

## 2013-08-13 NOTE — ED Notes (Signed)
Pt states that she lost her balance while getting dressed this am causing her to fall and hit her head against the table, pt has bruising and hematoma noted to back of head area, denies any LOC, cms intact all extremities, pt alert able to answer all questions,.

## 2013-08-13 NOTE — Discharge Instructions (Signed)
Fall Prevention and Home Safety Falls cause injuries and can affect all age groups. It is possible to use preventive measures to significantly decrease the likelihood of falls. There are many simple measures which can make your home safer and prevent falls. OUTDOORS  Repair cracks and edges of walkways and driveways.  Remove high doorway thresholds.  Trim shrubbery on the main path into your home.  Have good outside lighting.  Clear walkways of tools, rocks, debris, and clutter.  Check that handrails are not broken and are securely fastened. Both sides of steps should have handrails.  Have leaves, snow, and ice cleared regularly.  Use sand or salt on walkways during winter months.  In the garage, clean up grease or oil spills. BATHROOM  Install night lights.  Install grab bars by the toilet and in the tub and shower.  Use non-skid mats or decals in the tub or shower.  Place a plastic non-slip stool in the shower to sit on, if needed.  Keep floors dry and clean up all water on the floor immediately.  Remove soap buildup in the tub or shower on a regular basis.  Secure bath mats with non-slip, double-sided rug tape.  Remove throw rugs and tripping hazards from the floors. BEDROOMS  Install night lights.  Make sure a bedside light is easy to reach.  Do not use oversized bedding.  Keep a telephone by your bedside.  Have a firm chair with side arms to use for getting dressed.  Remove throw rugs and tripping hazards from the floor. KITCHEN  Keep handles on pots and pans turned toward the center of the stove. Use back burners when possible.  Clean up spills quickly and allow time for drying.  Avoid walking on wet floors.  Avoid hot utensils and knives.  Position shelves so they are not too high or low.  Place commonly used objects within easy reach.  If necessary, use a sturdy step stool with a grab bar when reaching.  Keep electrical cables out of the  way.  Do not use floor polish or wax that makes floors slippery. If you must use wax, use non-skid floor wax.  Remove throw rugs and tripping hazards from the floor. STAIRWAYS  Never leave objects on stairs.  Place handrails on both sides of stairways and use them. Fix any loose handrails. Make sure handrails on both sides of the stairways are as long as the stairs.  Check carpeting to make sure it is firmly attached along stairs. Make repairs to worn or loose carpet promptly.  Avoid placing throw rugs at the top or bottom of stairways, or properly secure the rug with carpet tape to prevent slippage. Get rid of throw rugs, if possible.  Have an electrician put in a light switch at the top and bottom of the stairs. OTHER FALL PREVENTION TIPS  Wear low-heel or rubber-soled shoes that are supportive and fit well. Wear closed toe shoes.  When using a stepladder, make sure it is fully opened and both spreaders are firmly locked. Do not climb a closed stepladder.  Add color or contrast paint or tape to grab bars and handrails in your home. Place contrasting color strips on first and last steps.  Learn and use mobility aids as needed. Install an electrical emergency response system.  Turn on lights to avoid dark areas. Replace light bulbs that burn out immediately. Get light switches that glow.  Arrange furniture to create clear pathways. Keep furniture in the same place.  Firmly attach carpet with non-skid or double-sided tape.  Eliminate uneven floor surfaces.  Select a carpet pattern that does not visually hide the edge of steps.  Be aware of all pets. OTHER HOME SAFETY TIPS  Set the water temperature for 120 F (48.8 C).  Keep emergency numbers on or near the telephone.  Keep smoke detectors on every level of the home and near sleeping areas. Document Released: 06/03/2002 Document Revised: 12/13/2011 Document Reviewed: 09/02/2011 [name] Patient Information 2014  [place], [place]. Hematoma A hematoma is a collection of blood under the skin, in an organ, in a body space, in a joint space, or in other tissue. The blood can clot to form a lump that you can see and feel. The lump is often firm and may sometimes become sore and tender. Most hematomas get better in a few days to weeks. However, some hematomas may be serious and require medical care. Hematomas can range in size from very small to very large. CAUSES  A hematoma can be caused by a blunt or penetrating injury. It can also be caused by spontaneous leakage from a blood vessel under the skin. Spontaneous leakage from a blood vessel is more likely to occur in older people, especially those taking blood thinners. Sometimes, a hematoma can develop after certain medical procedures. SIGNS AND SYMPTOMS   A firm lump on the body.  Possible pain and tenderness in the area.  Bruising.Blue, dark blue, purple-red, or yellowish skin may appear at the site of the hematoma if the hematoma is close to the surface of the skin. For hematomas in deeper tissues or body spaces, the signs and symptoms may be subtle. For example, an intra-abdominal hematoma may cause abdominal pain, weakness, fainting, and shortness of breath. An intracranial hematoma may cause a headache or symptoms such as weakness, trouble speaking, or a change in consciousness. DIAGNOSIS  A hematoma can usually be diagnosed based on your medical history and a physical exam. Imaging tests may be needed if your health care provider suspects a hematoma in deeper tissues or body spaces, such as the abdomen, head, or chest. These tests may include ultrasonography or a CT scan.  TREATMENT  Hematomas usually go away on their own over time. Rarely does the blood need to be drained out of the body. Large hematomas or those that may affect vital organs will sometimes need surgical drainage or monitoring. HOME CARE INSTRUCTIONS   Apply ice to the injured area:    Put ice in a plastic bag.   Place a towel between your skin and the bag.   Leave the ice on for 20 minutes, 2 3 times a day for the first 1 to 2 days.   After the first 2 days, switch to using warm compresses on the hematoma.   Elevate the injured area to help decrease pain and swelling. Wrapping the area with an elastic bandage may also be helpful. Compression helps to reduce swelling and promotes shrinking of the hematoma. Make sure the bandage is not wrapped too tight.   If your hematoma is on a lower extremity and is painful, crutches may be helpful for a couple days.   Only take over-the-counter or prescription medicines as directed by your health care provider. SEEK IMMEDIATE MEDICAL CARE IF:   You have increasing pain, or your pain is not controlled with medicine.   You have a fever.   You have worsening swelling or discoloration.   Your skin over the hematoma breaks or  starts bleeding.   Your hematoma is in your chest or abdomen and you have weakness, shortness of breath, or a change in consciousness.  Your hematoma is on your scalp (caused by a fall or injury) and you have a worsening headache or a change in alertness or consciousness. MAKE SURE YOU:   Understand these instructions.  Will watch your condition.  Will get help right away if you are not doing well or get worse. Document Released: 01/26/2004 Document Revised: 02/13/2013 Document Reviewed: 11/21/2012 [facility] Patient Information 2014 [place], [place].

## 2013-08-13 NOTE — ED Provider Notes (Signed)
CSN: [id number]     Arrival date & time 08/13/13  1240 History   First MD Initiated Contact with Patient 08/13/13 1253     Chief Complaint  Patient presents with  . Fall     (Consider location/radiation/quality/duration/timing/severity/associated sxs/prior Treatment) HPI  This is a 80 year old female with a history of atrial fibrillation on Coumadin who presents following a fall. Patient reports that she lost her balance this morning getting dressed and fell backwards striking her head. She denies loss of consciousness. She noted a small bone over the back of her head and "wanted to get checked out." She denies any weakness, vision changes. She does report mild headache. She has been ambulatory with her walker at baseline. She denies any neck pain, chest pain, shortness of breath, abdominal pain, urinary symptoms.  Past Medical History  Diagnosis Date  . Atrial fibrillation   . Osteoporosis     Knee; hip  . Wrist fracture 1988    Left  . Detached retina, right   . Chronic anticoagulation   . Compression fracture of thoracic spine, non-traumatic    Past Surgical History  Procedure Laterality Date  . Sp kyphoplasty      Percutaneous  . Retinal detachment surgery    . Cataract extraction      Bilateral  . Total knee arthroplasty  2006    Left  . Breast excisional biopsy  1954; 1968    Benign disease  . Orif wrist fracture  1988  . Colonoscopy  2000  . Back surgery     Family History  Problem Relation Age of Onset  . Heart disease Father     deceased age 70  . Heart attack Mother   . Arthritis     History  Substance Use Topics  . Smoking status: Never Smoker   . Smokeless tobacco: Never Used  . Alcohol Use: No   OB History   Grav Para Term Preterm Abortions TAB SAB Ect Mult Living                 Review of Systems  Constitutional: Negative for fever.  Respiratory: Negative for cough, chest tightness and shortness of breath.   Cardiovascular: Negative for chest  pain and palpitations.  Gastrointestinal: Negative for nausea, vomiting and abdominal pain.  Genitourinary: Negative for dysuria.  Musculoskeletal: Negative for back pain, neck pain and neck stiffness.  Skin: Negative for wound.  Neurological: Positive for headaches. Negative for dizziness, syncope, weakness and numbness.  Psychiatric/Behavioral: Negative for confusion.  All other systems reviewed and are negative.      Allergies  Nitrofurantoin  Home Medications   Current Outpatient Rx  Name  Route  Sig  Dispense  Refill  . acetaminophen (TYLENOL) 500 MG tablet   Oral   Take 500 mg by mouth at bedtime.         . Ascorbic Acid (VITAMIN C) 500 MG tablet   Oral   Take 500 mg by mouth daily.           . Calcium Carbonate (CALTRATE 600 PO)   Oral   Take 1 capsule by mouth 2 (two) times daily.           . carvedilol (COREG) 3.125 MG tablet   Oral   Take 3.125 mg by mouth 2 (two) times daily with a meal.           . fish oil-omega-3 fatty acids 1000 MG capsule   Oral   Take 1  g by mouth daily.          . fluticasone (VERAMYST) 27.5 MCG/SPRAY nasal spray   Nasal   2 sprays by Nasal route daily.           . Multiple Vitamins-Minerals (MULTIVITAMIN WITH MINERALS) tablet   Oral   Take 1 tablet by mouth daily.           . Potassium Bicarbonate 99 MG CAPS   Oral   Take 1 capsule by mouth daily.           Marland Kitchen warfarin (COUMADIN) 3 MG tablet   Oral   Take 3-4.5 mg by mouth daily. 4.5 mg (1.5 tablet) everyday except Sunday, and on Sundays, 3 mg ( 1 tablet)          BP 116/78  Pulse 89  Temp(Src) 97.8 F (36.6 C) (Oral)  Resp 18  Ht 5\' 4"  (1.626 m)  Wt 129 lb (58.514 kg)  BMI 22.13 kg/m2  SpO2 100% Physical Exam  Nursing note and vitals reviewed. Constitutional: She is oriented to person, place, and time. No distress.  Elderly  HENT:  Head: Normocephalic.  Quarter-sized hematoma to the occiput, no laceration or abrasion noted  Eyes: Pupils are  equal, round, and reactive to light.  Neck: Normal range of motion. Neck supple.  No C-spine tenderness  Cardiovascular: Normal rate and normal heart sounds.   Irregular rhythm  Pulmonary/Chest: Effort normal and breath sounds normal. No respiratory distress. She has no wheezes. She exhibits no tenderness.  Abdominal: Soft. There is no tenderness.  Musculoskeletal: Normal range of motion. She exhibits no edema.  Range of motion of bilateral hips, gait at baseline - walks with walker  Neurological: She is alert and oriented to person, place, and time.  Skin: Skin is warm and dry.  Psychiatric: She has a normal mood and affect.    ED Course  Procedures (including critical care time) Labs Review Labs Reviewed  PROTIME-INR - Abnormal; Notable for the following:    Prothrombin Time 32.2 (*)    INR 3.28 (*)    All other components within normal limits   Imaging Review Ct Head Wo Contrast  08/13/2013   CLINICAL DATA:  Pain post trauma  EXAM: CT HEAD WITHOUT CONTRAST  CT CERVICAL SPINE WITHOUT CONTRAST  TECHNIQUE: Multidetector CT imaging of the head and cervical spine was performed following the standard protocol without intravenous contrast. Multiplanar CT image reconstructions of the cervical spine were also generated.  COMPARISON:  CT head June 02, 2011  FINDINGS: CT HEAD FINDINGS  There is mild diffuse atrophy. There is no mass, hemorrhage, extra-axial fluid collection, or midline shift. There is patchy small vessel disease throughout the centra semiovale bilaterally. Elsewhere gray-white compartments appear normal. There is no demonstrable acute infarct. Bony calvarium appears intact. The mastoid air cells are clear. Cataracts are noted in both eyes. There are parietal scalp hematomas bilaterally.  CT CERVICAL SPINE FINDINGS  There is no fracture. Minimal retrolisthesis of C5 on C6 is felt to be due to underlying spondylosis. There is no other spondylolisthesis. Prevertebral soft tissues  and predental space regions are normal.  There is marked disc space narrowing at C5-6 and C6-7. There is milder disc space narrowing at all other levels. There is facet hypertrophy at all levels bilaterally. There is no frank disc extrusion or stenosis. There is scarring in the lung apices bilaterally. There are scattered foci of carotid artery calcification bilaterally.  IMPRESSION: CT head: Atrophy with  periventricular small vessel disease. No intracranial hemorrhage or mass. No extra-axial fluid. There are parietal scalp hematomas bilaterally.  CT cervical spine: Extensive spondylosis and osteoarthritic change. Minimal spondylolisthesis at C5-6 is felt to be due to underlying spondylosis. No fracture. There is calcification at foci in each carotid artery.   Electronically Signed   By: Bretta BangWilliam  Woodruff M.D.   On: 08/13/2013 13:39    EKG Interpretation   None       MDM   Final diagnoses:  Fall  Hematoma    Patient presents following a mechanical fall. She has a hematoma to the occiput of the scalp. No other obvious injury. She is ambulatory. Vital signs are within normal limits and her neurologic exam is reassuring. CT scan of the head is negative for intracranial injury. Patient was observed in the emergency department for approximately 3 hours without any further neurologic concerns. INR is therapeutic at 3.28.  EKG patient is in rate controlled atrial fibrillation. Patient given strict return precautions. Patient followup with primary care physician.  After history, exam, and medical workup I feel the patient has been appropriately medically screened and is safe for discharge home. Pertinent diagnoses were discussed with the patient. Patient was given return precautions.     Shon Batonourtney F Horton, MD 08/13/13 [phone]

## 2013-08-13 NOTE — ED Notes (Signed)
Pt reports got "off balance" today while getting dressed and fell backwards hitting head on a table.  Denies any LOC but reports has hematoma to back of head.  Reports is on coumadin.

## 2013-10-22 ENCOUNTER — Encounter (HOSPITAL_COMMUNITY): Payer: Self-pay | Admitting: Emergency Medicine

## 2013-10-22 ENCOUNTER — Emergency Department (HOSPITAL_COMMUNITY): Payer: Medicare Other

## 2013-10-22 ENCOUNTER — Inpatient Hospital Stay (HOSPITAL_COMMUNITY)
Admission: EM | Admit: 2013-10-22 | Discharge: 2013-10-29 | DRG: 481 | Disposition: A | Discharge status: Skilled Nursing Facility | Payer: Medicare Other | Attending: Internal Medicine | Admitting: Internal Medicine

## 2013-10-22 DIAGNOSIS — S7222XA Displaced subtrochanteric fracture of left femur, initial encounter for closed fracture: Secondary | ICD-10-CM

## 2013-10-22 DIAGNOSIS — D649 Anemia, unspecified: Secondary | ICD-10-CM

## 2013-10-22 DIAGNOSIS — Z7901 Long term (current) use of anticoagulants: Secondary | ICD-10-CM

## 2013-10-22 DIAGNOSIS — S22000A Wedge compression fracture of unspecified thoracic vertebra, initial encounter for closed fracture: Secondary | ICD-10-CM

## 2013-10-22 DIAGNOSIS — M25551 Pain in right hip: Secondary | ICD-10-CM

## 2013-10-22 DIAGNOSIS — R791 Abnormal coagulation profile: Secondary | ICD-10-CM | POA: Diagnosis present

## 2013-10-22 DIAGNOSIS — S7292XA Unspecified fracture of left femur, initial encounter for closed fracture: Secondary | ICD-10-CM

## 2013-10-22 DIAGNOSIS — H3320 Serous retinal detachment, unspecified eye: Secondary | ICD-10-CM

## 2013-10-22 DIAGNOSIS — S7223XA Displaced subtrochanteric fracture of unspecified femur, initial encounter for closed fracture: Principal | ICD-10-CM | POA: Diagnosis present

## 2013-10-22 DIAGNOSIS — Z96659 Presence of unspecified artificial knee joint: Secondary | ICD-10-CM

## 2013-10-22 DIAGNOSIS — S7290XA Unspecified fracture of unspecified femur, initial encounter for closed fracture: Secondary | ICD-10-CM

## 2013-10-22 DIAGNOSIS — D62 Acute posthemorrhagic anemia: Secondary | ICD-10-CM

## 2013-10-22 DIAGNOSIS — Z5181 Encounter for therapeutic drug level monitoring: Secondary | ICD-10-CM

## 2013-10-22 DIAGNOSIS — S72002A Fracture of unspecified part of neck of left femur, initial encounter for closed fracture: Secondary | ICD-10-CM

## 2013-10-22 DIAGNOSIS — Z8249 Family history of ischemic heart disease and other diseases of the circulatory system: Secondary | ICD-10-CM

## 2013-10-22 DIAGNOSIS — Z87311 Personal history of (healed) other pathological fracture: Secondary | ICD-10-CM

## 2013-10-22 DIAGNOSIS — M810 Age-related osteoporosis without current pathological fracture: Secondary | ICD-10-CM

## 2013-10-22 DIAGNOSIS — W19XXXA Unspecified fall, initial encounter: Secondary | ICD-10-CM | POA: Diagnosis present

## 2013-10-22 DIAGNOSIS — M25561 Pain in right knee: Secondary | ICD-10-CM

## 2013-10-22 DIAGNOSIS — R2681 Unsteadiness on feet: Secondary | ICD-10-CM

## 2013-10-22 DIAGNOSIS — I4891 Unspecified atrial fibrillation: Secondary | ICD-10-CM

## 2013-10-22 DIAGNOSIS — D696 Thrombocytopenia, unspecified: Secondary | ICD-10-CM

## 2013-10-22 HISTORY — DX: Atherosclerotic heart disease of native coronary artery without angina pectoris: I25.10

## 2013-10-22 LAB — PREPARE RBC (CROSSMATCH)

## 2013-10-22 LAB — URINALYSIS, ROUTINE W REFLEX MICROSCOPIC
BILIRUBIN URINE: NEGATIVE
Glucose, UA: NEGATIVE mg/dL
Hgb urine dipstick: NEGATIVE
Ketones, ur: NEGATIVE mg/dL
Leukocytes, UA: NEGATIVE
Nitrite: NEGATIVE
PH: 6.5 (ref 5.0–8.0)
Protein, ur: NEGATIVE mg/dL
Specific Gravity, Urine: 1.015 (ref 1.005–1.030)
Urobilinogen, UA: 0.2 mg/dL (ref 0.0–1.0)

## 2013-10-22 LAB — COMPREHENSIVE METABOLIC PANEL
ALBUMIN: 3.6 g/dL (ref 3.5–5.2)
ALK PHOS: 63 U/L (ref 39–117)
ALT: 13 U/L (ref 0–35)
AST: 26 U/L (ref 0–37)
BILIRUBIN TOTAL: 0.8 mg/dL (ref 0.3–1.2)
BUN: 19 mg/dL (ref 6–23)
CO2: 28 mEq/L (ref 19–32)
Calcium: 9.7 mg/dL (ref 8.4–10.5)
Chloride: 95 mEq/L — ABNORMAL LOW (ref 96–112)
Creatinine, Ser: 0.78 mg/dL (ref 0.50–1.10)
GFR calc Af Amer: 80 mL/min — ABNORMAL LOW (ref >90)
GFR calc non Af Amer: 69 mL/min — ABNORMAL LOW (ref >90)
Glucose, Bld: 135 mg/dL — ABNORMAL HIGH (ref 70–99)
POTASSIUM: 4.6 meq/L (ref 3.7–5.3)
Sodium: 133 mEq/L — ABNORMAL LOW (ref 137–147)
Total Protein: 6.8 g/dL (ref 6.0–8.3)

## 2013-10-22 LAB — CBC WITH DIFFERENTIAL/PLATELET
BASOS ABS: 0 10*3/uL (ref 0.0–0.1)
BASOS PCT: 0 % (ref 0–1)
Eosinophils Absolute: 0.1 10*3/uL (ref 0.0–0.7)
Eosinophils Relative: 1 % (ref 0–5)
HCT: 32.8 % — ABNORMAL LOW (ref 36.0–46.0)
Hemoglobin: 11.1 g/dL — ABNORMAL LOW (ref 12.0–15.0)
Lymphocytes Relative: 14 % (ref 12–46)
Lymphs Abs: 1.2 10*3/uL (ref 0.7–4.0)
MCH: 32.8 pg (ref 26.0–34.0)
MCHC: 33.8 g/dL (ref 30.0–36.0)
MCV: 97 fL (ref 78.0–100.0)
Monocytes Absolute: 0.9 10*3/uL (ref 0.1–1.0)
Monocytes Relative: 11 % (ref 3–12)
NEUTROS ABS: 6.2 10*3/uL (ref 1.7–7.7)
NEUTROS PCT: 74 % (ref 43–77)
Platelets: 249 10*3/uL (ref 150–400)
RBC: 3.38 MIL/uL — ABNORMAL LOW (ref 3.87–5.11)
RDW: 13.8 % (ref 11.5–15.5)
WBC: 8.4 10*3/uL (ref 4.0–10.5)

## 2013-10-22 LAB — PROTIME-INR
INR: 1.6 — AB (ref 0.00–1.49)
Prothrombin Time: 18.6 seconds — ABNORMAL HIGH (ref 11.6–15.2)

## 2013-10-22 LAB — ABO/RH: ABO/RH(D): O POS

## 2013-10-22 LAB — APTT: APTT: 33 s (ref 24–37)

## 2013-10-22 MED ORDER — ACETAMINOPHEN 500 MG PO TABS
500.0000 mg | ORAL_TABLET | Freq: Every day | ORAL | Status: DC
  Administered 2013-10-22 – 2013-10-28 (×6): 500 mg via ORAL
  Filled 2013-10-22 (×6): qty 1

## 2013-10-22 MED ORDER — VITAMIN K1 10 MG/ML IJ SOLN
INTRAMUSCULAR | Status: AC
  Filled 2013-10-22: qty 1

## 2013-10-22 MED ORDER — SODIUM CHLORIDE 0.9 % IV SOLN
INTRAVENOUS | Status: DC
  Administered 2013-10-22 – 2013-10-23 (×2): via INTRAVENOUS

## 2013-10-22 MED ORDER — FLUTICASONE FUROATE 27.5 MCG/SPRAY NA SUSP: 2.0000 | Freq: Every day | NASAL | Status: DC

## 2013-10-22 MED ORDER — ONDANSETRON HCL 4 MG PO TABS: 4.0000 mg | ORAL_TABLET | Freq: Four times a day (QID) | ORAL | Status: DC | PRN

## 2013-10-22 MED ORDER — ONDANSETRON HCL 4 MG/2ML IJ SOLN
4.0000 mg | Freq: Four times a day (QID) | INTRAMUSCULAR | Status: DC | PRN
  Administered 2013-10-23: 4 mg via INTRAVENOUS
  Filled 2013-10-22: qty 2

## 2013-10-22 MED ORDER — CARVEDILOL 3.125 MG PO TABS
3.1250 mg | ORAL_TABLET | Freq: Two times a day (BID) | ORAL | Status: DC
  Administered 2013-10-23 – 2013-10-29 (×13): 3.125 mg via ORAL
  Filled 2013-10-22 (×16): qty 1

## 2013-10-22 MED ORDER — HYDROMORPHONE HCL PF 1 MG/ML IJ SOLN: 1.0000 mg | INTRAMUSCULAR | Status: DC | PRN

## 2013-10-22 MED ORDER — SODIUM CHLORIDE 0.9 % IV SOLN: INTRAVENOUS | Status: DC

## 2013-10-22 MED ORDER — FLUTICASONE PROPIONATE 50 MCG/ACT NA SUSP
2.0000 | Freq: Every day | NASAL | Status: DC
  Administered 2013-10-23 – 2013-10-28 (×5): 2 via NASAL
  Filled 2013-10-22: qty 16

## 2013-10-22 MED ORDER — HYDROMORPHONE HCL PF 1 MG/ML IJ SOLN
0.5000 mg | INTRAMUSCULAR | Status: AC | PRN
  Administered 2013-10-23: 0.5 mg via INTRAVENOUS
  Filled 2013-10-22: qty 1

## 2013-10-22 MED ORDER — VITAMIN K1 10 MG/ML IJ SOLN
10.0000 mg | Freq: Once | INTRAVENOUS | Status: AC
  Administered 2013-10-22: 10 mg via INTRAVENOUS
  Filled 2013-10-22: qty 1

## 2013-10-22 MED ORDER — ONDANSETRON HCL 4 MG/2ML IJ SOLN: 4.0000 mg | Freq: Three times a day (TID) | INTRAMUSCULAR | Status: AC | PRN

## 2013-10-22 MED ORDER — CHLORHEXIDINE GLUCONATE 4 % EX LIQD
60.0000 mL | Freq: Once | CUTANEOUS | Status: AC
  Administered 2013-10-22: 4 via TOPICAL
  Filled 2013-10-22: qty 15

## 2013-10-22 MED ORDER — FENTANYL CITRATE 0.05 MG/ML IJ SOLN
50.0000 ug | Freq: Once | INTRAMUSCULAR | Status: AC
  Administered 2013-10-22: 50 ug via INTRAVENOUS
  Filled 2013-10-22: qty 2

## 2013-10-22 MED ORDER — CEFAZOLIN SODIUM-DEXTROSE 2-3 GM-% IV SOLR
2.0000 g | INTRAVENOUS | Status: AC
  Filled 2013-10-22: qty 50

## 2013-10-22 MED ORDER — MORPHINE SULFATE 4 MG/ML IJ SOLN
4.0000 mg | Freq: Once | INTRAMUSCULAR | Status: AC
  Administered 2013-10-22: 4 mg via INTRAVENOUS
  Filled 2013-10-22: qty 1

## 2013-10-22 NOTE — ED Notes (Signed)
Pt states her left knee gave out and she fell. Complain of pain in her left knee and thigh. deformity noted. Morphine sulfate 1 mg given by EMS prior to arrival

## 2013-10-22 NOTE — ED Provider Notes (Signed)
CSN: [id number]     Arrival date & time 10/22/13  1233 History  This chart was scribed for Geoffery Lyonsouglas Akaiya Touchette, MD by Leone PayorSonum Patel, ED Scribe. This patient was seen in room APA06/AP[facility]06 and the patient's care was started 1:36 PM.    Chief Complaint  Patient presents with  . Fall      The history is provided by the patient. No language interpreter was used.    HPI Comments: Kim [name] is a 80 y.o. female with past medical history of left knee surgery who presents to the Emergency Department complaining of a fall that occurred PTA. She states her left knee buckled, causing her to fall down. She struck the left side of her head on a cabinet but she denies LOC. She complains of sudden onset, constant, unchanged left knee and lower thigh pain. She takes coumadin regularly. She denies neck pain, abdominal pain, hip pain.    Past Medical History  Diagnosis Date  . Atrial fibrillation   . Osteoporosis     Knee; hip  . Wrist fracture 1988    Left  . Detached retina, right   . Chronic anticoagulation   . Compression fracture of thoracic spine, non-traumatic    Past Surgical History  Procedure Laterality Date  . Sp kyphoplasty      Percutaneous  . Retinal detachment surgery    . Cataract extraction      Bilateral  . Total knee arthroplasty  2006    Left  . Breast excisional biopsy  1954; 1968    Benign disease  . Orif wrist fracture  1988  . Colonoscopy  2000  . Back surgery     Family History  Problem Relation Age of Onset  . Heart disease Father     deceased age 78  . Heart attack Mother   . Arthritis     History  Substance Use Topics  . Smoking status: Never Smoker   . Smokeless tobacco: Never Used  . Alcohol Use: No   OB History   Grav Para Term Preterm Abortions TAB SAB Ect Mult Living                 Review of Systems  A complete 10 system review of systems was obtained and all systems are negative except as noted in the HPI and PMH.    Allergies   Nitrofurantoin  Home Medications   Prior to Admission medications   Medication Sig Start Date End Date Taking? Authorizing Provider  acetaminophen (TYLENOL) 500 MG tablet Take 500 mg by mouth at bedtime.    Historical Provider, MD  Ascorbic Acid (VITAMIN C) 500 MG tablet Take 500 mg by mouth daily.      Historical Provider, MD  Calcium Carbonate (CALTRATE 600 PO) Take 1 capsule by mouth 2 (two) times daily.     Historical Provider, MD  carvedilol (COREG) 3.125 MG tablet Take 3.125 mg by mouth 2 (two) times daily with a meal.      Historical Provider, MD  fish oil-omega-3 fatty acids 1000 MG capsule Take 1 g by mouth daily.     Historical Provider, MD  fluticasone (VERAMYST) 27.5 MCG/SPRAY nasal spray 2 sprays by Nasal route daily.      Historical Provider, MD  Multiple Vitamins-Minerals (MULTIVITAMIN WITH MINERALS) tablet Take 1 tablet by mouth daily.      Historical Provider, MD  Potassium Bicarbonate 99 MG CAPS Take 1 capsule by mouth daily.      Historical  Provider, MD  warfarin (COUMADIN) 3 MG tablet Take 3-4.5 mg by mouth daily. 4.5 mg (1.5 tablet) everyday except Sunday, and on Sundays, 3 mg ( 1 tablet)    Historical Provider, MD   BP 144/82  Pulse 98  Temp(Src) 99.4 F (37.4 C)  Resp 18  Ht 5\' 4"  (1.626 m)  Wt 129 lb (58.514 kg)  BMI 22.13 kg/m2  SpO2 100% Physical Exam  Nursing note and vitals reviewed. Constitutional: She is oriented to person, place, and time. She appears well-developed and well-nourished. No distress.  HENT:  Head: Normocephalic.  Eyes: EOM are normal.  Neck: Neck supple. No tracheal deviation present.  Cardiovascular: Normal rate.   Pulmonary/Chest: Effort normal. No respiratory distress.  Musculoskeletal: Normal range of motion.  Left leg is noted to have deformity above the left knee. Distal pulses, motor, and sensory are intact.   Neurological: She is alert and oriented to person, place, and time.  Skin: Skin is warm and dry.  Psychiatric: She  has a normal mood and affect. Her behavior is normal.    ED Course  Procedures (including critical care time)  DIAGNOSTIC STUDIES: Oxygen Saturation is 100% on RA, normal by my interpretation.    COORDINATION OF CARE: 1:38 PM Discussed treatment plan with pt at bedside and pt agreed to plan.   Labs Review Labs Reviewed - No data to display  Imaging Review No results found.   Date: 10/22/2013  Rate: 91  Rhythm: atrial fibrillation  QRS Axis: normal  Intervals: normal  ST/T Wave abnormalities: normal  Conduction Disutrbances:none  Narrative Interpretation:   Old EKG Reviewed: unchanged    MDM   Final diagnoses:  None    Patient is a 80 year old female with history of atrial fibrillation who is on Coumadin. She presents today by EMS after a fall. She states that her knee gave out on her causing her to fall on her left hip. She is complaining of severe pain in this area. X-rays reveal an obliquely oriented displaced fracture of the proximal left femur. I've discussed this with Dr. Romeo AppleHarrison from orthopedics who recommends admission to medicine for correction of her coagulopathy and medical clearance. I've spoken with Dr. Karilyn CotaGosrani who agrees to admit.  I personally performed the services described in this documentation, which was scribed in my presence. The recorded information has been reviewed and is accurate.      Geoffery Lyonsouglas [place], MD 10/22/13 1556

## 2013-10-22 NOTE — H&P (Signed)
[name] History and Physical  Kim [name] [id number] DOB: May 22, 1918 DOA: 10/22/2013  Referring physician: ER. PCP: [name], [name], MD   Chief Complaint: Left hip pain.  HPI: Kim EldersMary M Akhtar is a 80 y.o. female  This is a very healthy 80 year old lady who had a fall today and is complaining of pain in the left hip. She says that her left leg gave way. She does have a history of osteoporosis. She denies any syncopal episode. She has atrial fibrillation chronically and is on warfarin. She has no other symptoms.   Review of Systems:  Constitutional:  No weight loss, night sweats, Fevers, chills, fatigue.  HEENT:  No headaches, Difficulty swallowing,Tooth/dental problems,Sore throat,  No sneezing, itching, ear ache, nasal congestion, post nasal drip,  Cardio-vascular:  No chest pain, Orthopnea, PND, swelling in lower extremities, anasarca, dizziness, palpitations  GI:  No heartburn, indigestion, abdominal pain, nausea, vomiting, diarrhea, change in bowel habits, loss of appetite  Resp:  No shortness of breath with exertion or at rest. No excess mucus, no productive cough, No non-productive cough, No coughing up of blood.No change in color of mucus.No wheezing.No chest wall deformity  Skin:  no rash or lesions.  GU:  no dysuria, change in color of urine, no urgency or frequency. No flank pain.  Marland Kitchen.  Psych:  No change in mood or affect. No depression or anxiety. No memory loss.   Past Medical History  Diagnosis Date  . Atrial fibrillation   . Osteoporosis     Knee; hip  . Wrist fracture 1988    Left  . Detached retina, right   . Chronic anticoagulation   . Compression fracture of thoracic spine, non-traumatic    Past Surgical History  Procedure Laterality Date  . Sp kyphoplasty      Percutaneous  . Retinal detachment surgery    . Cataract extraction      Bilateral  . Total knee arthroplasty  2006    Left  . Breast excisional biopsy  1954; 1968    Benign  disease  . Orif wrist fracture  1988  . Colonoscopy  2000  . Back surgery     Social History:  reports that she has never smoked. She has never used smokeless tobacco. She reports that she does not drink alcohol or use illicit drugs.  Allergies  Allergen Reactions  . Nitrofurantoin Rash    Family History  Problem Relation Age of Onset  . Heart disease Father     deceased age 78  . Heart attack Mother   . Arthritis       Prior to Admission medications   Medication Sig Start Date End Date Taking? Authorizing Provider  acetaminophen (TYLENOL) 500 MG tablet Take 500 mg by mouth at bedtime. *takes one hour before bedtime for rest   Yes Historical Provider, MD  Ascorbic Acid (VITAMIN C) 500 MG tablet Take 500 mg by mouth daily.     Yes Historical Provider, MD  Calcium Carbonate (CALTRATE 600 PO) Take 1 capsule by mouth every morning.    Yes Historical Provider, MD  carvedilol (COREG) 3.125 MG tablet Take 3.125 mg by mouth 2 (two) times daily with a meal.     Yes Historical Provider, MD  fish oil-omega-3 fatty acids 1000 MG capsule Take 1 g by mouth every morning.    Yes Historical Provider, MD  fluticasone (VERAMYST) 27.5 MCG/SPRAY nasal spray Place 2 sprays into the nose at bedtime.   Yes Historical Provider, MD  Multiple Vitamins-Minerals (MULTIVITAMIN WITH MINERALS) tablet Take 1 tablet by mouth every morning.    Yes Historical Provider, MD  Potassium Bicarbonate 99 MG CAPS Take 1 capsule by mouth every morning.    Yes Historical Provider, MD  warfarin (COUMADIN) 3 MG tablet Take 3-4.5 mg by mouth daily. Takes one and one-half tablet every day (4.5mg  total), except takes one tablet on Mondays and Fridays   Yes Historical Provider, MD   Physical Exam: Filed Vitals:   10/22/13 1805  BP: 104/64  Pulse: 107  Temp:   Resp: 20    BP 104/64  Pulse 107  Temp(Src) 99.4 F (37.4 C)  Resp 20  Ht 5\' 4"  (1.626 m)  Wt 58.514 kg (129 lb)  BMI 22.13 kg/m2  SpO2 99%  General:  Appears  calm and comfortable. She looks younger than her stated age. Eyes: PERRL, normal lids, irises & conjunctiva ENT: grossly normal hearing, lips & tongue Neck: no LAD, masses or thyromegaly Cardiovascular: Heart sounds are in atrial fibrillation with ventricular rate which is controlled. No signs of heart failure. Telemetry: SR, no arrhythmias  Respiratory: CTA bilaterally, no w/r/r. Normal respiratory effort. Abdomen: soft, ntnd Skin: no rash or induration seen on limited exam Musculoskeletal: Left leg is shortened and externally rotated, consistent with left hip fracture. Psychiatric: grossly normal mood and affect, speech fluent and appropriate Neurologic: grossly non-focal.          Labs on Admission:  Basic Metabolic Panel:  Recent Labs Lab 10/22/13 1422  NA 133*  K 4.6  CL 95*  CO2 28  GLUCOSE 135*  BUN 19  CREATININE 0.78  CALCIUM 9.7   Liver Function Tests:  Recent Labs Lab 10/22/13 1422  AST 26  ALT 13  ALKPHOS 63  BILITOT 0.8  PROT 6.8  ALBUMIN 3.6   No results found for this basename: LIPASE, AMYLASE,  in the last 168 hours No results found for this basename: AMMONIA,  in the last 168 hours CBC:  Recent Labs Lab 10/22/13 1422  WBC 8.4  NEUTROABS 6.2  HGB 11.1*  HCT 32.8*  MCV 97.0  PLT 249   Cardiac Enzymes: No results found for this basename: CKTOTAL, CKMB, CKMBINDEX, TROPONINI,  in the last 168 hours  BNP (last 3 results) No results found for this basename: PROBNP,  in the last 8760 hours CBG: No results found for this basename: GLUCAP,  in the last 168 hours  Radiological Exams on Admission: Dg Chest 1 View  10/22/2013   CLINICAL DATA:  Pre operative respiratory exam. Proximal left femur fracture.  EXAM: CHEST - 1 VIEW  COMPARISON:  Chest x-ray dated 08/26/2003 and thoracic spine radiographs dated 09/26/2012  FINDINGS: Heart size and pulmonary vascularity are normal. Chronic accentuation of the interstitial markings with COPD. Compression  fracture at T8 has markedly progressed since the radiographs of 09/26/2012. There is an old compression fracture of T12 treated with vertebroplasty.  There are no infiltrates or effusions.  IMPRESSION: Marked increase in the severity of the compression fracture of T8 since 09/26/2012. COPD.   Electronically Signed   By: Geanie CooleyJim  Maxwell M.D.   On: 10/22/2013 15:38   Dg Femur Left  10/22/2013   CLINICAL DATA:  Fall and left leg pain.  EXAM: LEFT FEMUR - 2 VIEW  COMPARISON:  None.  FINDINGS: Two views of the femur demonstrate an obliquely oriented, displaced fracture involving the proximal left femur. The fracture involves the subtrochanteric region but does extend through the lesser trochanter.  The lesser trochanter is displaced. Fracture does not clearly involve the greater trochanter. The left hip is located with mild degenerative disease. Patient has a left knee replacement which is located. No evidence for a periprosthetic fracture.  IMPRESSION: Obliquely oriented and displaced fracture of the proximal left femur. The fracture involves the lesser trochanter and subtrochanteric femur.   Electronically Signed   By: Richarda Overlie M.D.   On: 10/22/2013 15:35   Ct Head Wo Contrast  10/22/2013   CLINICAL DATA:  Fall, headache  EXAM: CT HEAD WITHOUT CONTRAST  TECHNIQUE: Contiguous axial images were obtained from the base of the skull through the vertex without intravenous contrast.  COMPARISON:  Most recent CT scan of the head and cervical spine 08/13/2013  FINDINGS: Negative for acute intracranial hemorrhage, acute infarction, mass, mass effect, hydrocephalus or midline shift. Gray-white differentiation is preserved throughout. Stable age related atrophy. Advanced chronic microvascular ischemic white matter change. Globes and orbits are intact bilaterally. No focal scalp hematoma or calvarial fracture. Partial calcification of the right globe. Normal aeration of the bilateral mastoid air cells and visualized paranasal  sinuses. Atherosclerotic calcification in the bilateral cavernous supra clinoid carotid arteries.  IMPRESSION: 1. No acute intracranial abnormality. 2. Similar appearance of the brain with global atrophy, chronic microvascular ischemic white matter disease and intracranial atherosclerosis.   Electronically Signed   By: Malachy Moan M.D.   On: 10/22/2013 14:45   Dg Knee Complete 4 Views Left  10/22/2013   CLINICAL DATA:  Severe left knee pain secondary to a fall this morning. Previous total knee prosthesis insertion.  EXAM: LEFT KNEE - COMPLETE 4+ VIEW  COMPARISON:  05/12/2011  FINDINGS: There is no fracture or dislocation or appreciable joint effusion. No loosening of the components of the knee prosthesis.  IMPRESSION: No acute abnormalities.   Electronically Signed   By: Geanie Cooley M.D.   On: 10/22/2013 14:09    EKG: Independently reviewed. Atrial fibrillation without any ST-T wave changes. Ventricular rate is controlled.  Assessment/Plan   1. Left hip fracture. 2. Chronic atrial fibrillation, ventricular rate control. 3. Osteoporosis. 4. Chronic anticoagulation secondary to atrial fibrillation.  Plan: 1. Admit to telemetry floor. 2. Reverse the effects of Coumadin/warfarin. 3. Patient is medically stable to undergo surgery for the left hip fracture by orthopedics. 4. Orthopedics consultation appreciated.  Other recommendations will depend on patient's hospital progress.  Code Status: Full code.   Family Communication: I discussed the plan with patient at the bedside.   Disposition Plan: Will likely need skilled nursing facility for regurgitation postoperatively.   Time spent: 30 minutes.  Wilson Singer [name] Pager [phone].

## 2013-10-22 NOTE — Consult Note (Signed)
Reason for Consult: left subtrochanteric femur fracture  Referring Physician: Dr. Felipa Eth Kim [name] is an 80 y.o. female.  HPI: 80 year-old female previous left total knee done in [place] fell when her leg gave way as a comminuted left subtrochanteric femur fracture presents with pain deformity swelling and inability to walk. She has atrial fibrillation on Coumadin current INR is above normal levels.  Review of systems she says she's been feeling well until that time just came from the store today actually.  Past Medical History  Diagnosis Date  . Atrial fibrillation   . Osteoporosis     Knee; hip  . Wrist fracture 1988    Left  . Detached retina, right   . Chronic anticoagulation   . Compression fracture of thoracic spine, non-traumatic     Past Surgical History  Procedure Laterality Date  . Sp kyphoplasty      Percutaneous  . Retinal detachment surgery    . Cataract extraction      Bilateral  . Total knee arthroplasty  2006    Left  . Breast excisional biopsy  1954; 1968    Benign disease  . Orif wrist fracture  1988  . Colonoscopy  2000  . Back surgery      Family History  Problem Relation Age of Onset  . Heart disease Father     deceased age 32  . Heart attack Mother   . Arthritis      Social History:  reports that she has never smoked. She has never used smokeless tobacco. She reports that she does not drink alcohol or use illicit drugs.  Allergies:  Allergies  Allergen Reactions  . Nitrofurantoin Rash    Medications: I have reviewed the patient's current medications.  Results for orders placed during the hospital encounter of 10/22/13 (from the past 48 hour(s))  CBC WITH DIFFERENTIAL     Status: Abnormal   Collection Time    10/22/13  2:22 PM      Result Value Ref Range   WBC 8.4  4.0 - 10.5 K/uL   RBC 3.38 (*) 3.87 - 5.11 MIL/uL   Hemoglobin 11.1 (*) 12.0 - 15.0 g/dL   HCT 32.8 (*) 36.0 - 46.0 %   MCV 97.0  78.0 - 100.0 fL   MCH 32.8   26.0 - 34.0 pg   MCHC 33.8  30.0 - 36.0 g/dL   RDW 13.8  11.5 - 15.5 %   Platelets 249  150 - 400 K/uL   Neutrophils Relative % 74  43 - 77 %   Neutro Abs 6.2  1.7 - 7.7 K/uL   Lymphocytes Relative 14  12 - 46 %   Lymphs Abs 1.2  0.7 - 4.0 K/uL   Monocytes Relative 11  3 - 12 %   Monocytes Absolute 0.9  0.1 - 1.0 K/uL   Eosinophils Relative 1  0 - 5 %   Eosinophils Absolute 0.1  0.0 - 0.7 K/uL   Basophils Relative 0  0 - 1 %   Basophils Absolute 0.0  0.0 - 0.1 K/uL  COMPREHENSIVE METABOLIC PANEL     Status: Abnormal   Collection Time    10/22/13  2:22 PM      Result Value Ref Range   Sodium 133 (*) 137 - 147 mEq/L   Potassium 4.6  3.7 - 5.3 mEq/L   Chloride 95 (*) 96 - 112 mEq/L   CO2 28  19 - 32 mEq/L   Glucose,  Bld 135 (*) 70 - 99 mg/dL   BUN 19  6 - 23 mg/dL   Creatinine, Ser 0.78  0.50 - 1.10 mg/dL   Calcium 9.7  8.4 - 10.5 mg/dL   Total Protein 6.8  6.0 - 8.3 g/dL   Albumin 3.6  3.5 - 5.2 g/dL   AST 26  0 - 37 U/L   ALT 13  0 - 35 U/L   Alkaline Phosphatase 63  39 - 117 U/L   Total Bilirubin 0.8  0.3 - 1.2 mg/dL   GFR calc non Af Amer 69 (*) >90 mL/min   GFR calc Af Amer 80 (*) >90 mL/min   Comment: (NOTE)     The eGFR has been calculated using the CKD EPI equation.     This calculation has not been validated in all clinical situations.     eGFR's persistently <90 mL/min signify possible Chronic Kidney     Disease.  PROTIME-INR     Status: Abnormal   Collection Time    10/22/13  2:22 PM      Result Value Ref Range   Prothrombin Time 18.6 (*) 11.6 - 15.2 seconds   INR 1.60 (*) 0.00 - 1.49    Dg Chest 1 View  10/22/2013   CLINICAL DATA:  Pre operative respiratory exam. Proximal left femur fracture.  EXAM: CHEST - 1 VIEW  COMPARISON:  Chest x-ray dated 08/26/2003 and thoracic spine radiographs dated 09/26/2012  FINDINGS: Heart size and pulmonary vascularity are normal. Chronic accentuation of the interstitial markings with COPD. Compression fracture at T8 has  markedly progressed since the radiographs of 09/26/2012. There is an old compression fracture of T12 treated with vertebroplasty.  There are no infiltrates or effusions.  IMPRESSION: Marked increase in the severity of the compression fracture of T8 since 09/26/2012. COPD.   Electronically Signed   By: Rozetta Nunnery M.D.   On: 10/22/2013 15:38   Dg Femur Left  10/22/2013   CLINICAL DATA:  Fall and left leg pain.  EXAM: LEFT FEMUR - 2 VIEW  COMPARISON:  None.  FINDINGS: Two views of the femur demonstrate an obliquely oriented, displaced fracture involving the proximal left femur. The fracture involves the subtrochanteric region but does extend through the lesser trochanter. The lesser trochanter is displaced. Fracture does not clearly involve the greater trochanter. The left hip is located with mild degenerative disease. Patient has a left knee replacement which is located. No evidence for a periprosthetic fracture.  IMPRESSION: Obliquely oriented and displaced fracture of the proximal left femur. The fracture involves the lesser trochanter and subtrochanteric femur.   Electronically Signed   By: Markus Daft M.D.   On: 10/22/2013 15:35   Ct Head Wo Contrast  10/22/2013   CLINICAL DATA:  Fall, headache  EXAM: CT HEAD WITHOUT CONTRAST  TECHNIQUE: Contiguous axial images were obtained from the base of the skull through the vertex without intravenous contrast.  COMPARISON:  Most recent CT scan of the head and cervical spine 08/13/2013  FINDINGS: Negative for acute intracranial hemorrhage, acute infarction, mass, mass effect, hydrocephalus or midline shift. Gray-white differentiation is preserved throughout. Stable age related atrophy. Advanced chronic microvascular ischemic white matter change. Globes and orbits are intact bilaterally. No focal scalp hematoma or calvarial fracture. Partial calcification of the right globe. Normal aeration of the bilateral mastoid air cells and visualized paranasal sinuses.  Atherosclerotic calcification in the bilateral cavernous supra clinoid carotid arteries.  IMPRESSION: 1. No acute intracranial abnormality. 2. Similar appearance  of the brain with global atrophy, chronic microvascular ischemic white matter disease and intracranial atherosclerosis.   Electronically Signed   By: Jacqulynn Cadet M.D.   On: 10/22/2013 14:45   Dg Knee Complete 4 Views Left  10/22/2013   CLINICAL DATA:  Severe left knee pain secondary to a fall this morning. Previous total knee prosthesis insertion.  EXAM: LEFT KNEE - COMPLETE 4+ VIEW  COMPARISON:  05/12/2011  FINDINGS: There is no fracture or dislocation or appreciable joint effusion. No loosening of the components of the knee prosthesis.  IMPRESSION: No acute abnormalities.   Electronically Signed   By: Rozetta Nunnery M.D.   On: 10/22/2013 14:09    Review of Systems  All other systems reviewed and are negative.  Blood pressure 104/64, pulse 107, temperature 99.4 F (37.4 C), resp. rate 20, height $RemoveBe'5\' 4"'cVemQStIa$  (1.626 m), weight 129 lb (58.514 kg), SpO2 99.00%. Physical Exam The patient is well-developed and nourished no distress or diaphoresis. Normocephalic atraumatic. Pupils equal reactive to light. Neck range of motion supple and normal. No tracheal deviation no stridor. Heart rate is normal chest effort is normal breath sounds are clear and equal bowel sounds are normal and soft without distention noted abdominal exam. Skin is warm dry and intact without any significant rashes or ulcerations. She is awake alert and oriented x3 she looks younger than her stated age. She is oriented as stated. She has no reflex deficits in the upper extremities she has good grip strength bilaterally no deformities in her upper extremities she has some mild arthritic nodes in the hand otherwise her range of motion is functional shoulder elbow and wrist bilaterally  Her left leg is externally rotated there is significant thigh swelling and tenderness. Muscle tone  remains normal throughout the lower extremity. Her feet are a little to touch but her capillary refill is normal she has good dorsalis pedis pulses  Her right leg shows no contracture subluxation atrophy or tremor  Her left lower extremity shows a significant subtrochanteric femur fracture   Assessment/Plan: She is healthy 89 he is old has atrial fibrillation some anticoagulation as well but is definitely a surgical candidate. Recommend femoral nailing with cerclage wire. This is slightly complicated due to the total knee replacements but should be manageable with cerclage wire and nailing. I am recommending reversal of anticoagulation and open treatment internal fixation with cerclage wiring and nailing of the left femur    Carole Civil 10/22/2013, 6:19 PM

## 2013-10-23 DIAGNOSIS — Z5181 Encounter for therapeutic drug level monitoring: Secondary | ICD-10-CM

## 2013-10-23 DIAGNOSIS — Z7901 Long term (current) use of anticoagulants: Secondary | ICD-10-CM

## 2013-10-23 DIAGNOSIS — I4891 Unspecified atrial fibrillation: Secondary | ICD-10-CM

## 2013-10-23 DIAGNOSIS — D649 Anemia, unspecified: Secondary | ICD-10-CM

## 2013-10-23 DIAGNOSIS — S7290XA Unspecified fracture of unspecified femur, initial encounter for closed fracture: Secondary | ICD-10-CM

## 2013-10-23 LAB — COMPREHENSIVE METABOLIC PANEL
ALK PHOS: 47 U/L (ref 39–117)
ALT: 12 U/L (ref 0–35)
AST: 23 U/L (ref 0–37)
Albumin: 3.1 g/dL — ABNORMAL LOW (ref 3.5–5.2)
BUN: 30 mg/dL — ABNORMAL HIGH (ref 6–23)
CO2: 26 meq/L (ref 19–32)
Calcium: 8.9 mg/dL (ref 8.4–10.5)
Chloride: 99 mEq/L (ref 96–112)
Creatinine, Ser: 1.15 mg/dL — ABNORMAL HIGH (ref 0.50–1.10)
GFR calc non Af Amer: 39 mL/min — ABNORMAL LOW (ref >90)
GFR, EST AFRICAN AMERICAN: 46 mL/min — AB (ref >90)
GLUCOSE: 122 mg/dL — AB (ref 70–99)
POTASSIUM: 5.3 meq/L (ref 3.7–5.3)
SODIUM: 136 meq/L — AB (ref 137–147)
TOTAL PROTEIN: 6 g/dL (ref 6.0–8.3)
Total Bilirubin: 1.3 mg/dL — ABNORMAL HIGH (ref 0.3–1.2)

## 2013-10-23 LAB — CBC
HCT: 27.5 % — ABNORMAL LOW (ref 36.0–46.0)
Hemoglobin: 9.1 g/dL — ABNORMAL LOW (ref 12.0–15.0)
MCH: 32.5 pg (ref 26.0–34.0)
MCHC: 33.1 g/dL (ref 30.0–36.0)
MCV: 98.2 fL (ref 78.0–100.0)
Platelets: 208 10*3/uL (ref 150–400)
RBC: 2.8 MIL/uL — ABNORMAL LOW (ref 3.87–5.11)
RDW: 14.2 % (ref 11.5–15.5)
WBC: 9.6 10*3/uL (ref 4.0–10.5)

## 2013-10-23 LAB — PROTIME-INR
INR: 1.64 — AB (ref 0.00–1.49)
Prothrombin Time: 19 seconds — ABNORMAL HIGH (ref 11.6–15.2)

## 2013-10-23 LAB — SURGICAL PCR SCREEN
MRSA, PCR: NEGATIVE
STAPHYLOCOCCUS AUREUS: POSITIVE — AB

## 2013-10-23 MED ORDER — SODIUM CHLORIDE 0.9 % IV SOLN
INTRAVENOUS | Status: DC
  Administered 2013-10-23: 11:00:00 via INTRAVENOUS

## 2013-10-23 MED ORDER — HYDROCODONE-ACETAMINOPHEN 5-325 MG PO TABS
1.0000 | ORAL_TABLET | Freq: Four times a day (QID) | ORAL | Status: DC | PRN
  Administered 2013-10-23 – 2013-10-24 (×3): 2 via ORAL
  Administered 2013-10-24: 1 via ORAL
  Administered 2013-10-25 – 2013-10-26 (×3): 2 via ORAL
  Administered 2013-10-27: 1 via ORAL
  Administered 2013-10-27 – 2013-10-28 (×3): 2 via ORAL
  Administered 2013-10-28: 1 via ORAL
  Administered 2013-10-29: 2 via ORAL
  Filled 2013-10-23: qty 2
  Filled 2013-10-23 (×2): qty 1
  Filled 2013-10-23 (×3): qty 2
  Filled 2013-10-23: qty 1
  Filled 2013-10-23 (×4): qty 2
  Filled 2013-10-23: qty 1
  Filled 2013-10-23 (×2): qty 2
  Filled 2013-10-23: qty 1

## 2013-10-23 MED ORDER — VITAMIN K1 10 MG/ML IJ SOLN
10.0000 mg | Freq: Once | INTRAVENOUS | Status: DC
  Filled 2013-10-23: qty 1

## 2013-10-23 MED ORDER — MUPIROCIN 2 % EX OINT
TOPICAL_OINTMENT | Freq: Two times a day (BID) | CUTANEOUS | Status: DC
  Administered 2013-10-23 – 2013-10-24 (×4): via NASAL
  Filled 2013-10-23 (×2): qty 22

## 2013-10-23 MED ORDER — SODIUM CHLORIDE 0.9 % IV BOLUS (SEPSIS): 500.0000 mL | Freq: Once | INTRAVENOUS | Status: DC

## 2013-10-23 MED ORDER — ACETAMINOPHEN 325 MG PO TABS
650.0000 mg | ORAL_TABLET | Freq: Four times a day (QID) | ORAL | Status: DC | PRN
  Administered 2013-10-23: 650 mg via ORAL
  Filled 2013-10-23 (×2): qty 2

## 2013-10-23 MED ORDER — PHYTONADIONE 1 MG/0.5 ML ORAL SOLUTION
1.0000 mg | Freq: Once | ORAL | Status: AC
  Administered 2013-10-23: 1 mg via ORAL
  Filled 2013-10-23: qty 0.5

## 2013-10-23 MED ORDER — HYDROCODONE-ACETAMINOPHEN 5-325 MG PO TABS
1.0000 | ORAL_TABLET | Freq: Four times a day (QID) | ORAL | Status: DC | PRN
  Administered 2013-10-23: 1 via ORAL
  Filled 2013-10-23: qty 1

## 2013-10-23 NOTE — Progress Notes (Signed)
Late entry: 1016 Dr. Romeo AppleHarrison gave order to transfuse 2 units of PRBCs.  Surgery will not be completed today due to INR.

## 2013-10-23 NOTE — Progress Notes (Signed)
Patient's BP low with VS check (see flowsheet).  Rate 97-105 AFib on Tele.  Patient resting in bed.  States leg feels much better.  Dr. Irene LimboGoodrich notified via text page.

## 2013-10-23 NOTE — Progress Notes (Signed)
PROGRESS NOTE  Kim EldersMary M [name] [id number] DOB: Oct 30, 1917 DOA: 10/22/2013 PCP: Kim [name], Kim [name]  Summary: 80 year old woman with a mechanical fall resulting in left proximal femur fracture. Admitted for further evaluation. History of atrial fibrillation on warfarin.  Assessment/Plan: 1. Left proximal femur fracture status post mechanical fall. Operative management plan when INR at goal. Pain control. No history of MI, heart failure. No history of chest pain or shortness of breath. Fairly active for her age. Recommend proceeding with surgery without further evaluation. 2. Atrial fibrillation, maintained on warfarin. Borderline rate control. Given IV vitamin K 4/28 in the evening. INR without significant change this morning. No obvious bleeding. 3. Normocytic anemia. Suspect acute on chronic with dilutional component, possible bleeding at fracture site. Asymptomatic.   Surgery canceled today by orthopedics secondary to INR 1.64. Operative management plan when INR at goal.  Repeat vitamin K, orally today. No history of stroke, VTE. Given drop in hemoglobin will continue SCDs but avoid heparin products today.  Monitor hemoglobin. Orthopedics plans transfusion.  Heart rate mildly elevated, likely related to missed a dose of Coreg last evening. Monitor clinically.  Discussed with son at bedside.  Code Status: full code DVT prophylaxis: SCDs, resume warfarin when able. Lovenox when Hgb stable and cleared by orthopedics Family Communication:  Disposition Plan: likely SNF for short-term rehab  Kim Sacksaniel Halina Asano, [name]  [name]  Pager [phone] If 7PM-7AM, please contact night-coverage at [email or web address], password [email or web address] 10/23/2013, 10:28 AM  LOS: 1 day   Consultants:  Orthopedics   Procedures:    Antibiotics:    HPI/Subjective: Pain controlled. No chest pain or shortness of breath. Hungry.  Objective: Filed Vitals:   10/22/13 1805 10/22/13 2021 10/23/13 0423 10/23/13 0842    BP: 104/64 97/54 111/48 115/55  Pulse: 107 108 99 112  Temp:  98.5 F (36.9 C) 98.2 F (36.8 C)   TempSrc:  Oral Oral   Resp: 20 20 20    Height:  5\' 4"  (1.626 m)    Weight:   58.242 kg (128 lb 6.4 oz)   SpO2: 99% 100% 98%     Intake/Output Summary (Last 24 hours) at 10/23/13 1028 Last data filed at 10/23/13 0618  Gross per 24 hour  Intake      0 ml  Output    350 ml  Net   -350 ml     Filed Weights   10/22/13 1235 10/23/13 0423  Weight: 58.514 kg (129 lb) 58.242 kg (128 lb 6.4 oz)    Exam:   Afebrile, vital signs stable, mild tachycardia. No hypoxia.  Gen. Appears calm and comfortable. Nontoxic.  Cardiovascular. Irregular. No murmur, rub or gallop. Tachycardic. No lower extremity edema. Telemetry atrial fibrillation.  Respiratory clear to auscultation bilaterally. No wheezes, rales or rhonchi. Normal respiratory effort.  Psychiatric. Grossly normal mood and affect. Speech fluent and appropriate.  Data Reviewed:  BUN elevated, creatinine elevated. Total bilirubin marginally elevated.   Hemoglobin 11.1 >> 9.1  INR 1.60 >> 1.64   CT head no acute abnormalities.  EKG on admission atrial fibrillation, rate controlled, nonspecific T wave changes. No acute changes seen.   Scheduled Meds: . acetaminophen  500 mg Oral QHS  . carvedilol  3.125 mg Oral BID WC  .  ceFAZolin (ANCEF) IV  2 g Intravenous On Call to OR  . fluticasone  2 spray Each Nare QHS  . mupirocin ointment   Nasal BID   Continuous Infusions: . sodium chloride 75 mL/hr at 10/22/13 [id number]  Principal Problem:   Femur fracture, left Active Problems:   FIBRILLATION, ATRIAL   OSTEOPOROSIS   Chronic anticoagulation   Fracture of hip, left, closed   Normocytic anemia   Time spent 20 minutes

## 2013-10-23 NOTE — Care Management Note (Addendum)
    Page 1 of 1   10/29/2013     3:34:01 PM CARE MANAGEMENT NOTE 10/29/2013  Patient:  Kim EldersCHILDREY,Kim [name]   Account Number:  [id number]  Date Initiated:  10/23/2013  Documentation initiated by:  Rosemary HolmsOBSON,Corian Handley  Subjective/Objective Assessment:   Pt admitted from home where she lives alone. Fx femur. Holding for INR to level for OR.     Action/Plan:   Anticipated DC Date:     Anticipated DC Plan:  SKILLED NURSING FACILITY  In-house referral  Clinical Social Worker      DC Planning Services  CM consult      Choice offered to / List presented to:             Status of service:  Completed, signed off Medicare Important Message given?  YES (If response is "NO", the following Medicare IM given date fields will be blank) Date Medicare IM given:  10/29/2013 Date Additional Medicare IM given:    Discharge Disposition:  SKILLED NURSING FACILITY  Per UR Regulation:    If discussed at Long Length of Stay Meetings, dates discussed:   10/29/2013    Comments:  10/29/13 Rosemary HolmsAmy Favian Kittleson RN BSN [facility]  10/23/13 Atwood Adcock RN BSN [facility] but CM anticipates skilled rehab.

## 2013-10-23 NOTE — Progress Notes (Signed)
Dr. Irene LimboGoodrich on unit.  Notified of patient's cancellation of surgery today.  Informed that 2 units of PRBCs had been ordered and that IV Vit K had been ordered.  Dr. Irene LimboGoodrich stated to discontinue the IV Vit K and that he will order oral Vit K for today.

## 2013-10-23 NOTE — Progress Notes (Signed)
Late entry 1725:  Patient's BP 104/49.  Heart rate >100 Afib. Dr.  Irene LimboGoodrich stated to administer the 1700 dose of Coreg.

## 2013-10-24 DIAGNOSIS — D649 Anemia, unspecified: Secondary | ICD-10-CM

## 2013-10-24 LAB — PROTIME-INR
INR: 1.36 (ref 0.00–1.49)
PROTHROMBIN TIME: 16.4 s — AB (ref 11.6–15.2)

## 2013-10-24 LAB — BASIC METABOLIC PANEL
BUN: 39 mg/dL — ABNORMAL HIGH (ref 6–23)
CO2: 27 mEq/L (ref 19–32)
Calcium: 8.2 mg/dL — ABNORMAL LOW (ref 8.4–10.5)
Chloride: 102 mEq/L (ref 96–112)
Creatinine, Ser: 1.1 mg/dL (ref 0.50–1.10)
GFR calc non Af Amer: 42 mL/min — ABNORMAL LOW (ref >90)
GFR, EST AFRICAN AMERICAN: 48 mL/min — AB (ref >90)
Glucose, Bld: 122 mg/dL — ABNORMAL HIGH (ref 70–99)
Potassium: 4.5 mEq/L (ref 3.7–5.3)
Sodium: 136 mEq/L — ABNORMAL LOW (ref 137–147)

## 2013-10-24 LAB — CBC
HCT: 27.7 % — ABNORMAL LOW (ref 36.0–46.0)
Hemoglobin: 9.4 g/dL — ABNORMAL LOW (ref 12.0–15.0)
MCH: 31.4 pg (ref 26.0–34.0)
MCHC: 33.9 g/dL (ref 30.0–36.0)
MCV: 92.6 fL (ref 78.0–100.0)
Platelets: 128 10*3/uL — ABNORMAL LOW (ref 150–400)
RBC: 2.99 MIL/uL — ABNORMAL LOW (ref 3.87–5.11)
RDW: 15.7 % — AB (ref 11.5–15.5)
WBC: 10 10*3/uL (ref 4.0–10.5)

## 2013-10-24 LAB — PREPARE RBC (CROSSMATCH)

## 2013-10-24 NOTE — Progress Notes (Signed)
Subjective: Right subtrochanteric femur fracture Anticoagulation Anemia     Recent Labs  10/22/13 1422 10/23/13 0546 10/24/13 0615  HGB 11.1* 9.1* 9.4*    Recent Labs  10/23/13 0546 10/24/13 0615  WBC 9.6 10.0  RBC 2.80* 2.99*  HCT 27.5* 27.7*  PLT 208 128*    Recent Labs  10/23/13 0546 10/24/13 0615  NA 136* 136*  K 5.3 4.5  CL 99 102  CO2 26 27  BUN 30* 39*  CREATININE 1.15* 1.10  GLUCOSE 122* 122*  CALCIUM 8.9 8.2*    Recent Labs  10/22/13 1422 10/23/13 0546  INR 1.60* 1.64*     Assessment/Plan: Transfuse 2 more untits of blood Check INR SURGERY NOW FOR Friday (HG TOO LOW )    Kim [name] 10/24/2013, 7:33 AM

## 2013-10-24 NOTE — Progress Notes (Signed)
PROGRESS NOTE  Kim EldersMary M [name] [id number] DOB: [birth year] DOA: 10/22/2013 PCP: [name], [name], MD  Summary: 80 year old woman with a mechanical fall resulting in left proximal femur fracture. Admitted for further evaluation. History of atrial fibrillation on warfarin.  Assessment/Plan: 1. Left proximal femur fracture status post mechanical fall. Operative management planned 5.1. No history of MI, heart failure. No history of chest pain or shortness of breath. Fairly active for her age. Recommend proceeding with surgery without further evaluation. 2. Atrial fibrillation, maintained on warfarin. Borderline rate control. Likely to improve with transfusion. Continue Coreg. INR has normalized. 3. Normocytic anemia. No change noted with transfusion 2 units packed red blood cells. Additional 2 units ordered by orthopedics. No evidence of bleeding. Left eye edematous, suspect bleeding around fracture site. Patient relatively asymptomatic. Management per orthopedics. Check hemoglobin later today. 4. Thrombocytopenia. Likely related to blood loss. Follow clinically. Not on heparin products.   Transfusion 2 units packed red blood cells per orthopedics.  Strict I/O  Check hemoglobin later today.  Code Status: full code DVT prophylaxis: SCDs, resume warfarin when able. Lovenox when Hgb stable and cleared by orthopedics Family Communication:  Disposition Plan: likely SNF for short-term rehab  [name], MD  [name]  Pager [phone] If 7PM-7AM, please contact night-coverage at [email or web address], password [email or web address] 10/24/2013, 12:09 PM  LOS: 2 days   Consultants:  Orthopedics   Procedures:  4/29 Transfusion 2 units packed red blood cells  Antibiotics:    HPI/Subjective: Overall feels okay except for left hip pain. No other complaints. Breathing fine. No nausea or vomiting.  Objective: Filed Vitals:   10/23/13 2325 10/24/13 0025 10/24/13 0639 10/24/13 0906  BP: 90/41 93/41 94/58   98/48  Pulse: 102 112 103 108  Temp: 98.3 F (36.8 C) 97.8 F (36.6 C) 98.2 F (36.8 C)   TempSrc: Oral Oral Oral   Resp: 16 16 18    Height:      Weight:      SpO2: 96% 99% 97%     Intake/Output Summary (Last 24 hours) at 10/24/13 1209 Last data filed at 10/24/13 0640  Gross per 24 hour  Intake 1210.5 ml  Output    450 ml  Net  760.5 ml     Filed Weights   10/22/13 1235 10/23/13 0423  Weight: 58.514 kg (129 lb) 58.242 kg (128 lb 6.4 oz)    Exam:   Afebrile, vital signs stable, borderline low blood pressure. No hypoxia. Normal respiratory rate.  Gen. Appears calm, relatively comfortable. Nontoxic.  Respiratory clear to auscultation bilaterally. No wheezes, rales or rhonchi. Normal respiratory effort.  Cardiovascular. Regular rate and rhythm. No murmur, rub or gallop. No lower extremity edema.  Abdomen soft.  Left proximal thigh with significant edema. Left foot warm and dry.  Data Reviewed:  BUN 30  >> 39  Creatinine 1.15 >> 1.10  Hemoglobin 9.1 >> 9.4 status post 2 units packed red blood cells  Platelet count 208 >> 128  INR 1.64  >> 1.36  Scheduled Meds: . acetaminophen  500 mg Oral QHS  . carvedilol  3.125 mg Oral BID WC  . fluticasone  2 spray Each Nare QHS  . mupirocin ointment   Nasal BID   Continuous Infusions:    Principal Problem:   Femur fracture, left Active Problems:   FIBRILLATION, ATRIAL   OSTEOPOROSIS   Chronic anticoagulation   Fracture of hip, left, closed   Normocytic anemia   Time spent 20 minutes

## 2013-10-24 NOTE — Progress Notes (Signed)
Patient's BP 98/48 manual.  Heart rate 99-114 AFib on Tele.  Patient resting in bed.  States her left leg/hip is hurting off and on.  2 units of PRBCs ordered to administer today.  Dr. Irene LimboGoodrich notified.  Stated to administer the morning dose of Coreg and continue to monitor.

## 2013-10-25 ENCOUNTER — Encounter (HOSPITAL_COMMUNITY): Payer: Medicare Other | Admitting: Anesthesiology

## 2013-10-25 ENCOUNTER — Inpatient Hospital Stay (HOSPITAL_COMMUNITY): Payer: Medicare Other

## 2013-10-25 ENCOUNTER — Inpatient Hospital Stay (HOSPITAL_COMMUNITY): Payer: Medicare Other | Admitting: Anesthesiology

## 2013-10-25 ENCOUNTER — Encounter (HOSPITAL_COMMUNITY)
Admission: EM | Disposition: A | Discharge status: Skilled Nursing Facility | Payer: Self-pay | Source: Home / Self Care | Attending: Family Medicine

## 2013-10-25 ENCOUNTER — Encounter (HOSPITAL_COMMUNITY): Payer: Self-pay | Admitting: Anesthesiology

## 2013-10-25 DIAGNOSIS — S7222XA Displaced subtrochanteric fracture of left femur, initial encounter for closed fracture: Secondary | ICD-10-CM | POA: Diagnosis present

## 2013-10-25 HISTORY — PX: FEMUR IM NAIL: SHX1597

## 2013-10-25 LAB — CBC WITH DIFFERENTIAL/PLATELET
BASOS ABS: 0 10*3/uL (ref 0.0–0.1)
Basophils Relative: 0 % (ref 0–1)
EOS ABS: 0 10*3/uL (ref 0.0–0.7)
Eosinophils Relative: 0 % (ref 0–5)
HCT: 34.8 % — ABNORMAL LOW (ref 36.0–46.0)
Hemoglobin: 12 g/dL (ref 12.0–15.0)
Lymphocytes Relative: 9 % — ABNORMAL LOW (ref 12–46)
Lymphs Abs: 0.9 10*3/uL (ref 0.7–4.0)
MCH: 31.9 pg (ref 26.0–34.0)
MCHC: 34.5 g/dL (ref 30.0–36.0)
MCV: 92.6 fL (ref 78.0–100.0)
MONO ABS: 1.6 10*3/uL — AB (ref 0.1–1.0)
Monocytes Relative: 15 % — ABNORMAL HIGH (ref 3–12)
Neutro Abs: 8.4 10*3/uL — ABNORMAL HIGH (ref 1.7–7.7)
Neutrophils Relative %: 76 % (ref 43–77)
PLATELETS: 128 10*3/uL — AB (ref 150–400)
RBC: 3.76 MIL/uL — ABNORMAL LOW (ref 3.87–5.11)
RDW: 15 % (ref 11.5–15.5)
WBC: 11 10*3/uL — ABNORMAL HIGH (ref 4.0–10.5)

## 2013-10-25 LAB — PROTIME-INR
INR: 1.31 (ref 0.00–1.49)
Prothrombin Time: 16 seconds — ABNORMAL HIGH (ref 11.6–15.2)

## 2013-10-25 LAB — BASIC METABOLIC PANEL
BUN: 32 mg/dL — ABNORMAL HIGH (ref 6–23)
CALCIUM: 8.5 mg/dL (ref 8.4–10.5)
CO2: 24 mEq/L (ref 19–32)
Chloride: 101 mEq/L (ref 96–112)
Creatinine, Ser: 0.7 mg/dL (ref 0.50–1.10)
GFR calc non Af Amer: 72 mL/min — ABNORMAL LOW (ref >90)
GFR, EST AFRICAN AMERICAN: 83 mL/min — AB (ref >90)
Glucose, Bld: 118 mg/dL — ABNORMAL HIGH (ref 70–99)
Potassium: 4.1 mEq/L (ref 3.7–5.3)
SODIUM: 135 meq/L — AB (ref 137–147)

## 2013-10-25 LAB — CBC
HCT: 33.3 % — ABNORMAL LOW (ref 36.0–46.0)
Hemoglobin: 11.6 g/dL — ABNORMAL LOW (ref 12.0–15.0)
MCH: 32 pg (ref 26.0–34.0)
MCHC: 34.8 g/dL (ref 30.0–36.0)
MCV: 92 fL (ref 78.0–100.0)
PLATELETS: 122 10*3/uL — AB (ref 150–400)
RBC: 3.62 MIL/uL — AB (ref 3.87–5.11)
RDW: 15 % (ref 11.5–15.5)
WBC: 10.3 10*3/uL (ref 4.0–10.5)

## 2013-10-25 SURGERY — INSERTION, INTRAMEDULLARY ROD, FEMUR
Anesthesia: Monitor Anesthesia Care | Site: Hip | Laterality: Left

## 2013-10-25 MED ORDER — SENNA 8.6 MG PO TABS
1.0000 | ORAL_TABLET | Freq: Two times a day (BID) | ORAL | Status: DC
  Administered 2013-10-25 – 2013-10-29 (×8): 8.6 mg via ORAL
  Filled 2013-10-25 (×8): qty 1

## 2013-10-25 MED ORDER — FENTANYL CITRATE 0.05 MG/ML IJ SOLN
INTRAMUSCULAR | Status: AC
  Filled 2013-10-25: qty 2

## 2013-10-25 MED ORDER — HYDROCODONE-ACETAMINOPHEN 5-325 MG PO TABS: 1.0000 | ORAL_TABLET | Freq: Four times a day (QID) | ORAL | Status: DC | PRN

## 2013-10-25 MED ORDER — PROPOFOL INFUSION 10 MG/ML OPTIME
INTRAVENOUS | Status: DC | PRN
  Administered 2013-10-25: 50 ug/kg/min via INTRAVENOUS
  Administered 2013-10-25: 25 ug/kg/min via INTRAVENOUS

## 2013-10-25 MED ORDER — ACETAMINOPHEN 325 MG PO TABS: 650.0000 mg | ORAL_TABLET | Freq: Four times a day (QID) | ORAL | Status: DC | PRN

## 2013-10-25 MED ORDER — LACTATED RINGERS IV SOLN
INTRAVENOUS | Status: DC | PRN
  Administered 2013-10-25 (×2): via INTRAVENOUS

## 2013-10-25 MED ORDER — SODIUM CHLORIDE BACTERIOSTATIC 0.9 % IJ SOLN
INTRAMUSCULAR | Status: AC
  Filled 2013-10-25: qty 10

## 2013-10-25 MED ORDER — FENTANYL CITRATE 0.05 MG/ML IJ SOLN
INTRAMUSCULAR | Status: DC | PRN
  Administered 2013-10-25: 20 ug via INTRATHECAL

## 2013-10-25 MED ORDER — CEFAZOLIN SODIUM-DEXTROSE 2-3 GM-% IV SOLR
2.0000 g | Freq: Four times a day (QID) | INTRAVENOUS | Status: AC
  Administered 2013-10-25 (×2): 2 g via INTRAVENOUS
  Filled 2013-10-25 (×2): qty 50

## 2013-10-25 MED ORDER — CARVEDILOL 3.125 MG PO TABS: 3.1250 mg | ORAL_TABLET | Freq: Once | ORAL | Status: DC

## 2013-10-25 MED ORDER — FLEET ENEMA 7-19 GM/118ML RE ENEM: 1.0000 | ENEMA | Freq: Once | RECTAL | Status: AC | PRN

## 2013-10-25 MED ORDER — ONDANSETRON HCL 4 MG/2ML IJ SOLN: 4.0000 mg | Freq: Four times a day (QID) | INTRAMUSCULAR | Status: DC | PRN

## 2013-10-25 MED ORDER — PHENYLEPHRINE HCL 10 MG/ML IJ SOLN
INTRAMUSCULAR | Status: DC | PRN
  Administered 2013-10-25: 100 ug via INTRAVENOUS
  Administered 2013-10-25: 50 ug via INTRAVENOUS
  Administered 2013-10-25 (×3): 100 ug via INTRAVENOUS
  Administered 2013-10-25: 50 ug via INTRAVENOUS
  Administered 2013-10-25: 100 ug via INTRAVENOUS

## 2013-10-25 MED ORDER — BISACODYL 10 MG RE SUPP
10.0000 mg | Freq: Every day | RECTAL | Status: DC | PRN
  Administered 2013-10-29: 10 mg via RECTAL
  Filled 2013-10-25: qty 1

## 2013-10-25 MED ORDER — POLYETHYLENE GLYCOL 3350 17 G PO PACK: 17.0000 g | PACK | Freq: Every day | ORAL | Status: DC | PRN

## 2013-10-25 MED ORDER — METOCLOPRAMIDE HCL 10 MG PO TABS: 5.0000 mg | ORAL_TABLET | Freq: Three times a day (TID) | ORAL | Status: DC | PRN

## 2013-10-25 MED ORDER — ONDANSETRON HCL 4 MG/2ML IJ SOLN: 4.0000 mg | Freq: Once | INTRAMUSCULAR | Status: DC | PRN

## 2013-10-25 MED ORDER — LIDOCAINE HCL (CARDIAC) 10 MG/ML IV SOLN
INTRAVENOUS | Status: DC | PRN
  Administered 2013-10-25: 50 mg via INTRAVENOUS

## 2013-10-25 MED ORDER — ALUM & MAG HYDROXIDE-SIMETH 200-200-20 MG/5ML PO SUSP: 30.0000 mL | ORAL | Status: DC | PRN

## 2013-10-25 MED ORDER — BUPIVACAINE IN DEXTROSE 0.75-8.25 % IT SOLN
INTRATHECAL | Status: AC
  Filled 2013-10-25: qty 2

## 2013-10-25 MED ORDER — MIDAZOLAM HCL 2 MG/2ML IJ SOLN
INTRAMUSCULAR | Status: AC
  Filled 2013-10-25: qty 2

## 2013-10-25 MED ORDER — FENTANYL CITRATE 0.05 MG/ML IJ SOLN: 25.0000 ug | INTRAMUSCULAR | Status: DC | PRN

## 2013-10-25 MED ORDER — LACTATED RINGERS IV SOLN
INTRAVENOUS | Status: DC
  Administered 2013-10-25: 1000 mL via INTRAVENOUS

## 2013-10-25 MED ORDER — SODIUM CHLORIDE 0.9 % IR SOLN
Status: DC | PRN
  Administered 2013-10-25: 1000 mL
  Administered 2013-10-25: 1

## 2013-10-25 MED ORDER — CEFAZOLIN SODIUM-DEXTROSE 2-3 GM-% IV SOLR
INTRAVENOUS | Status: AC
  Filled 2013-10-25: qty 50

## 2013-10-25 MED ORDER — MIDAZOLAM HCL 2 MG/2ML IJ SOLN
1.0000 mg | INTRAMUSCULAR | Status: DC | PRN
  Administered 2013-10-25 (×2): 0.5 mg via INTRAVENOUS

## 2013-10-25 MED ORDER — METHOCARBAMOL 500 MG PO TABS: 500.0000 mg | ORAL_TABLET | Freq: Four times a day (QID) | ORAL | Status: DC | PRN

## 2013-10-25 MED ORDER — BUPIVACAINE IN DEXTROSE 0.75-8.25 % IT SOLN
INTRATHECAL | Status: DC | PRN
  Administered 2013-10-25: 13 mg via INTRATHECAL

## 2013-10-25 MED ORDER — ARTIFICIAL TEARS OP OINT
TOPICAL_OINTMENT | OPHTHALMIC | Status: AC
  Filled 2013-10-25: qty 3.5

## 2013-10-25 MED ORDER — FENTANYL CITRATE 0.05 MG/ML IJ SOLN
25.0000 ug | INTRAMUSCULAR | Status: DC
  Administered 2013-10-25: 25 ug via INTRAVENOUS

## 2013-10-25 MED ORDER — POTASSIUM CHLORIDE IN NACL 20-0.9 MEQ/L-% IV SOLN
INTRAVENOUS | Status: DC
  Administered 2013-10-25 – 2013-10-26 (×4): via INTRAVENOUS

## 2013-10-25 MED ORDER — DOCUSATE SODIUM 100 MG PO CAPS
100.0000 mg | ORAL_CAPSULE | Freq: Two times a day (BID) | ORAL | Status: DC
  Administered 2013-10-25 – 2013-10-29 (×8): 100 mg via ORAL
  Filled 2013-10-25 (×8): qty 1

## 2013-10-25 MED ORDER — EPHEDRINE SULFATE 50 MG/ML IJ SOLN
INTRAMUSCULAR | Status: AC
  Filled 2013-10-25: qty 1

## 2013-10-25 MED ORDER — MORPHINE SULFATE 2 MG/ML IJ SOLN
0.5000 mg | INTRAMUSCULAR | Status: DC | PRN
  Administered 2013-10-25 – 2013-10-26 (×2): 0.5 mg via INTRAVENOUS
  Filled 2013-10-25 (×3): qty 1

## 2013-10-25 MED ORDER — ACETAMINOPHEN 650 MG RE SUPP: 650.0000 mg | Freq: Four times a day (QID) | RECTAL | Status: DC | PRN

## 2013-10-25 MED ORDER — MORPHINE SULFATE 2 MG/ML IJ SOLN
0.5000 mg | INTRAMUSCULAR | Status: DC | PRN
  Administered 2013-10-25: 0.5 mg via INTRAVENOUS
  Filled 2013-10-25: qty 1

## 2013-10-25 MED ORDER — METOCLOPRAMIDE HCL 5 MG/ML IJ SOLN: 5.0000 mg | Freq: Three times a day (TID) | INTRAMUSCULAR | Status: DC | PRN

## 2013-10-25 MED ORDER — ONDANSETRON HCL 4 MG PO TABS: 4.0000 mg | ORAL_TABLET | Freq: Four times a day (QID) | ORAL | Status: DC | PRN

## 2013-10-25 MED ORDER — CEFAZOLIN SODIUM-DEXTROSE 2-3 GM-% IV SOLR
INTRAVENOUS | Status: DC | PRN
  Administered 2013-10-25: 2 g via INTRAVENOUS

## 2013-10-25 MED ORDER — PROPOFOL 10 MG/ML IV BOLUS
INTRAVENOUS | Status: AC
  Filled 2013-10-25: qty 20

## 2013-10-25 MED ORDER — PHENOL 1.4 % MT LIQD: 1.0000 | OROMUCOSAL | Status: DC | PRN

## 2013-10-25 MED ORDER — MENTHOL 3 MG MT LOZG: 1.0000 | LOZENGE | OROMUCOSAL | Status: DC | PRN

## 2013-10-25 MED ORDER — METHOCARBAMOL 1000 MG/10ML IJ SOLN
500.0000 mg | Freq: Four times a day (QID) | INTRAVENOUS | Status: DC | PRN
  Filled 2013-10-25: qty 5

## 2013-10-25 SURGICAL SUPPLY — 65 items
BAG HAMPER (MISCELLANEOUS) ×2 IMPLANT
BANDAGE GAUZE ELAST BULKY 4 IN (GAUZE/BANDAGES/DRESSINGS) ×2 IMPLANT
BIT DRILL 3.8X6 NS (BIT) ×2 IMPLANT
BIT DRILL 5.3 NS (BIT) ×2 IMPLANT
BIT DRILL 6.5X4.8 (BIT) ×4 IMPLANT
BLADE 10 SAFETY STRL DISP (BLADE) ×4 IMPLANT
CHLORAPREP W/TINT 26ML (MISCELLANEOUS) ×4 IMPLANT
CLOTH BEACON ORANGE TIMEOUT ST (SAFETY) ×2 IMPLANT
COVER LIGHT HANDLE STERIS (MISCELLANEOUS) ×4 IMPLANT
DRAPE C-ARM FOLDED MOBILE STRL (DRAPES) ×2 IMPLANT
DRAPE STERI IOBAN 125X83 (DRAPES) IMPLANT
DRAPE STERI URO 9X17 APER PCH (DRAPES) ×2 IMPLANT
DRAPE UTILITY W/TAPE 26X15 (DRAPES) ×4 IMPLANT
DRESSING ALLEVYN BORDER HEEL (GAUZE/BANDAGES/DRESSINGS) ×2 IMPLANT
DRSG MEPILEX BORDER 4X12 (GAUZE/BANDAGES/DRESSINGS) ×2 IMPLANT
DRSG MEPILEX BORDER 4X8 (GAUZE/BANDAGES/DRESSINGS) ×4 IMPLANT
GAUZE SPONGE 4X4 16PLY XRAY LF (GAUZE/BANDAGES/DRESSINGS) IMPLANT
GAUZE XEROFORM 5X9 LF (GAUZE/BANDAGES/DRESSINGS) IMPLANT
GLOVE BIO SURGEON STRL SZ7 (GLOVE) ×2 IMPLANT
GLOVE BIO SURGEON STRL SZ8.5 (GLOVE) ×2 IMPLANT
GLOVE BIOGEL PI IND STRL 7.0 (GLOVE) ×1 IMPLANT
GLOVE BIOGEL PI INDICATOR 7.0 (GLOVE) ×1
GLOVE ECLIPSE 6.5 STRL STRAW (GLOVE) ×8 IMPLANT
GLOVE EXAM NITRILE MD LF STRL (GLOVE) ×2 IMPLANT
GLOVE SKINSENSE NS SZ8.0 LF (GLOVE) ×4
GLOVE SKINSENSE STRL SZ8.0 LF (GLOVE) ×4 IMPLANT
GLOVE SS N UNI LF 8.5 STRL (GLOVE) ×2 IMPLANT
GOWN STRL REUS W/TWL LRG LVL3 (GOWN DISPOSABLE) ×6 IMPLANT
GOWN STRL REUS W/TWL XL LVL3 (GOWN DISPOSABLE) ×4 IMPLANT
GUIDE PIN 3.2 ×2 IMPLANT
GUIDEWIRE BALL NOSE 100CM (WIRE) ×4 IMPLANT
IMMOBILIZER SHOULDER MED (ORTHOPEDIC SUPPLIES) ×2 IMPLANT
INST SET MAJOR BONE (KITS) ×2 IMPLANT
KIT ROOM TURNOVER APOR (KITS) ×2 IMPLANT
MANIFOLD NEPTUNE II (INSTRUMENTS) ×2 IMPLANT
MARKER SKIN DUAL TIP RULER LAB (MISCELLANEOUS) ×2 IMPLANT
NEEDLE SPNL 18GX3.5 QUINCKE PK (NEEDLE) ×2 IMPLANT
NS IRRIG 1000ML POUR BTL (IV SOLUTION) ×2 IMPLANT
PACK BASIC III (CUSTOM PROCEDURE TRAY) ×1
PACK SRG BSC III STRL LF ECLPS (CUSTOM PROCEDURE TRAY) ×1 IMPLANT
PAD ABD 5X9 TENDERSORB (GAUZE/BANDAGES/DRESSINGS) ×4 IMPLANT
PAD ARMBOARD 7.5X6 YLW CONV (MISCELLANEOUS) ×2 IMPLANT
PENCIL HANDSWITCHING (ELECTRODE) ×2 IMPLANT
SCREW ACE CAP BN (Screw) ×1 IMPLANT
SCREW ACE CORTICAL 6.5X70MML (Screw) ×2 IMPLANT
SCREW ACECAP 44MM (Screw) ×2 IMPLANT
SCREW BN OBLQ FT 54X4.5XST 2 (Screw) ×1 IMPLANT
SCREW CANCELLOUS 6.5X100 (Screw) ×2 IMPLANT
SCREW CANCELLOUS 6.5X90 (Screw) ×2 IMPLANT
SET BASIN LINEN APH (SET/KITS/TRAYS/PACK) ×2 IMPLANT
SLEEVE CABLE 2MM VT (Orthopedic Implant) ×4 IMPLANT
SPONGE GAUZE 4X4 12PLY (GAUZE/BANDAGES/DRESSINGS) IMPLANT
SPONGE LAP 18X18 X RAY DECT (DISPOSABLE) ×10 IMPLANT
STAPLER VISISTAT 35W (STAPLE) ×2 IMPLANT
SUT MNCRL 0 VIOLET CTX 36 (SUTURE) ×3 IMPLANT
SUT MON AB 2-0 CT1 36 (SUTURE) ×2 IMPLANT
SUT MON AB 2-0 SH 27 (SUTURE)
SUT MON AB 2-0 SH27 (SUTURE) IMPLANT
SUT MONOCRYL 0 CTX 36 (SUTURE) ×3
SUT VIC AB 1 CT1 27 (SUTURE)
SUT VIC AB 1 CT1 27XBRD ANTBC (SUTURE) IMPLANT
SYR BULB IRRIGATION 50ML (SYRINGE) ×4 IMPLANT
Versanail (Hips) ×2 IMPLANT
YANKAUER SUCT 12FT TUBE ARGYLE (SUCTIONS) ×2 IMPLANT
YANKAUER SUCT BULB TIP 10FT TU (MISCELLANEOUS) ×2 IMPLANT

## 2013-10-25 NOTE — Op Note (Signed)
Patient patient education was completed in the preop area the left hip was marked as a surgical site the chart was updated and reviewed patient was taken to the operative spinal anesthesia. After spinal anesthesia was placed on fracture table with the left leg in traction and the right leg and padding.  The leg was manipulated in traction rotation abduction and adduction until an acceptable reduction was obtained although decision was made at that point to obtain anatomical reduction to  flexion of the proximal fragment.  The leg was then prepped and draped. Timeout was completed.  A linear incision was made over the proximal femur. Subcutaneous tissue was divided down to fascia. Fascia was split in line with skin incision. The vastus lateralis muscle was split in line with its fibers. Subperiosteal dissection expose the fracture and further manipulation of the leg was performed along with application of 2 bone clamps and 2 cerclage wires. Anatomical reduction was obtained.  A proximal incision was made over the greater trochanter. Subcutaneous tissue was divided down to fascia fascia was split in line with skin incision. A guidepin was placed in the trochanter and confirmed in position on AP and lateral x-ray. This was overreamed with a proximal reamer followed by introduction of a guidewire down to the level of the knee prosthesis that was in place. This was confirmed by x-ray. We overreamed the guidewire from 9 up to 13. The nail was measured to be 34 cm. We passed the nail. We then placed 2 screws proximally and one antegrade and 1 into the femoral head.  We then turned our attention to the distal locking screws we placed 2 screws through one single incision. X-rays confirmed anatomic reduction hardware in good position screw lengths appropriate  All wounds were copiously irrigated and closed in layered fashion with 0 Monocryl suture.  Rotational alignment was confirmed.  The patient was taken to  the recovery in stable condition

## 2013-10-25 NOTE — Progress Notes (Signed)
Chg bath done in prep for surgery this am. Vss.

## 2013-10-25 NOTE — Anesthesia Postprocedure Evaluation (Signed)
  Anesthesia Post-op Note  Patient: Kim [name]  Procedure(s) Performed: Procedure(s) with comments: INTRAMEDULLARY (IM) NAIL FEMORAL WITH DALL MILES CABLE TO LEFT FEMUR (Left) - BIOMET   Patient Location: PACU  Anesthesia Type:Spinal  Level of Consciousness: awake, alert , oriented and patient cooperative  Airway and Oxygen Therapy: Patient Spontanous Breathing and Patient connected to nasal cannula oxygen  Post-op Pain: 4 /10, mild  Post-op Assessment: Post-op Vital signs reviewed, Patient's Cardiovascular Status Stable, Respiratory Function Stable, Patent Airway, No signs of Nausea or vomiting and Pain level controlled  Post-op Vital Signs: Reviewed and stable  Last Vitals:  Filed Vitals:   10/25/13 0845  BP: 103/47  Pulse:   Temp:   Resp: 66    Complications: No apparent anesthesia complications

## 2013-10-25 NOTE — Transfer of Care (Signed)
Immediate Anesthesia Transfer of Care Note  Patient: Kim [name]  Procedure(s) Performed: Procedure(s) with comments: INTRAMEDULLARY (IM) NAIL FEMORAL WITH DALL MILES CABLE TO LEFT FEMUR (Left) - BIOMET   Patient Location: PACU  Anesthesia Type:Spinal  Level of Consciousness: awake and patient cooperative  Airway & Oxygen Therapy: Patient Spontanous Breathing and Patient connected to face mask oxygen  Post-op Assessment: Report given to PACU RN and Post -op Vital signs reviewed and stable  Post vital signs: Reviewed and stable  Complications: No apparent anesthesia complications

## 2013-10-25 NOTE — Progress Notes (Signed)
Patient returned to floor. Drowsy but arousable. Denies pain. Dressing to left hip clean dry and intact

## 2013-10-25 NOTE — Anesthesia Preprocedure Evaluation (Signed)
Anesthesia Evaluation  Patient identified by MRN, date of birth, ID band Patient awake    Reviewed: Allergy & Precautions, H&P , NPO status , Patient's Chart, lab work & pertinent test results, reviewed documented beta blocker date and time   Airway Mallampati: II TM Distance: >3 FB Neck ROM: Limited    Dental  (+) Teeth Intact   Pulmonary neg pulmonary ROS,  breath sounds clear to auscultation        Cardiovascular + CAD + dysrhythmias Atrial Fibrillation Rate:Normal     Neuro/Psych    GI/Hepatic negative GI ROS,   Endo/Other    Renal/GU      Musculoskeletal   Abdominal   Peds  Hematology  (+) anemia ,   Anesthesia Other Findings   Reproductive/Obstetrics                           Anesthesia Physical Anesthesia Plan  ASA: IV  Anesthesia Plan: Spinal and MAC   Post-op Pain Management:    Induction:   Airway Management Planned: Simple Face Mask  Additional Equipment:   Intra-op Plan:   Post-operative Plan:   Informed Consent: I have reviewed the patients History and Physical, chart, labs and discussed the procedure including the risks, benefits and alternatives for the proposed anesthesia with the patient or authorized representative who has indicated his/her understanding and acceptance.     Plan Discussed with:   Anesthesia Plan Comments:         Anesthesia Quick Evaluation

## 2013-10-25 NOTE — Brief Op Note (Signed)
10/22/2013 - 10/25/2013  12:53 PM  PATIENT:  Kim [name]  80 y.o. female  PRE-OPERATIVE DIAGNOSIS:  Subtrochanteric femur fracture left leg  POST-OPERATIVE DIAGNOSIS:  Subtrochanteric femur fracture left leg  PROCEDURE:  Procedure(s) with comments: INTRAMEDULLARY (IM) NAIL FEMORAL WITH DALL MILES CABLE TO LEFT FEMUR (Left) - BIOMET   SURGEON:  Surgeon(s) and Role:    * Vickki HearingStanley E Harrison, MD - Primary  PHYSICIAN ASSISTANT:   ASSISTANTS: betty ashley    ANESTHESIA:   spinal  EBL:  Total I/O In: 1700 [I.V.:1700] Out: 400 [Urine:100; Blood:300]  BLOOD ADMINISTERED:none  DRAINS: none   LOCAL MEDICATIONS USED:  NONE  SPECIMEN:  No Specimen  DISPOSITION OF SPECIMEN:  N/A  COUNTS:  YES  TOURNIQUET:  * No tourniquets in log *  DICTATION: .Dragon Dictation  PLAN OF CARE: Admit to inpatient   PATIENT DISPOSITION:  PACU - hemodynamically stable.   Delay start of Pharmacological VTE agent (>24hrs) due to surgical blood loss or risk of bleeding: yes  biomet versa nail with proximal and distal locks  360 mm nail  90 and 75 proximal screws

## 2013-10-25 NOTE — Progress Notes (Signed)
  PROGRESS NOTE  Kim [name] [id number] DOB: 03-30-1918 DOA: 10/22/2013 PCP: [name], [name], MD  Summary: 80 year old woman with a mechanical fall resulting in left proximal femur fracture. Admitted for further evaluation. History of atrial fibrillation on warfarin.  Assessment/Plan: 1. Left proximal femur fracture status post mechanical fall. Status post surgery 5/1.  2. Atrial fibrillation, maintained on warfarin as an outpatient. Rate controlled. Continue Coreg. INR has normalized. 3. Normocytic anemia. Now stable status post 4 units packed red blood cells No evidence of bleeding. Suspect bleeding around fracture site.  4. Thrombocytopenia. Likely related to blood loss. Stable. Not on heparin products.   Postoperative management per orthopedics.  CBC and basic metabolic panel in the morning.  Code Status: full code DVT prophylaxis: SCDs, resume warfarin when able. Lovenox when Hgb stable and cleared by orthopedics Family Communication: Discussed with sons at bedside Disposition Plan: likely SNF for short-term rehab  [name], MD  [name]  Pager [phone] If 7PM-7AM, please contact night-coverage at [email or web address], password [email or web address] 10/25/2013, 5:47 PM  LOS: 3 days   Consultants:  Orthopedics   Procedures:  4/29 Transfusion 2 units packed red blood cells  4/30 transfusion 2 units packed red blood cells  5/1 INTRAMEDULLARY (IM) NAIL FEMORAL WITH DALL MILES CABLE TO LEFT FEMUR (Left) - BIOMET  HPI/Subjective: Seen postoperatively. Complains of left hip pain.  Objective: Filed Vitals:   10/25/13 1330 10/25/13 1400 10/25/13 1430 10/25/13 1606  BP: 119/53 105/66 123/86 103/66  Pulse: 77 90  100  Temp:  98 F (36.7 C) 97.2 F (36.2 C) 98.2 F (36.8 C)  TempSrc:      Resp: 19 18 16 16   Height:      Weight:      SpO2: 99% 97% 97% 99%    Intake/Output Summary (Last 24 hours) at 10/25/13 1747 Last data filed at 10/25/13 1525  Gross per 24 hour    Intake 2362.5 ml  Output   1800 ml  Net  562.5 ml     Filed Weights   10/22/13 1235 10/23/13 0423  Weight: 58.514 kg (129 lb) 58.242 kg (128 lb 6.4 oz)    Exam:   Afebrile, vital signs stable.  Gen. Appears calm, mildly uncomfortable. Nontoxic.  Cardiovascular regular rate and rhythm. No murmur, rub or gallop.  Respiratory clear to auscultation bilaterally. No wheezes, rales or rhonchi. Normal respiratory effort.  Psychiatric. Grossly normal mood and affect. Speech fluent and appropriate.  Data Reviewed:  Creatinine 1.15 >> 1.10 >> 0.7  Hemoglobin improved, 12.0  Platelet count stable, 128  INR 1.31  Scheduled Meds: . acetaminophen  500 mg Oral QHS  . carvedilol  3.125 mg Oral BID WC  . carvedilol  3.125 mg Oral Once  .  ceFAZolin (ANCEF) IV  2 g Intravenous Q6H  . docusate sodium  100 mg Oral BID  . fluticasone  2 spray Each Nare QHS  . senna  1 tablet Oral BID   Continuous Infusions: . 0.9 % NaCl with KCl 20 mEq / L 100 mL/hr at 10/25/13 1524    Principal Problem:   Femur fracture, left Active Problems:   FIBRILLATION, ATRIAL   OSTEOPOROSIS   Chronic anticoagulation   Fracture of hip, left, closed   Normocytic anemia   Subtrochanteric fracture of left femur   Time spent 15 minutes

## 2013-10-25 NOTE — Anesthesia Procedure Notes (Addendum)
Date/Time: 10/25/2013 9:55 AM Performed by: Pernell DupreADAMS, AMY A Pre-anesthesia Checklist: Patient identified, Timeout performed, Emergency Drugs available, Suction available and Patient being monitored Oxygen Delivery Method: Simple face mask    Spinal  Patient location during procedure: OR Start time: 10/25/2013 9:12 AM Staffing CRNA/Resident: ADAMS, AMY A Preanesthetic Checklist Completed: patient identified, site marked, surgical consent, pre-op evaluation, timeout performed, IV checked, risks and benefits discussed and monitors and equipment checked Spinal Block Patient position: left lateral decubitus Prep: Betadine Patient monitoring: heart rate, cardiac monitor, continuous pulse ox and blood pressure Approach: left paramedian Location: L3-4 Injection technique: single-shot Needle Needle type: Spinocan  Needle gauge: 22 G Needle length: 9 cm Assessment Sensory level: T8 Additional Notes  ATTEMPTS:1 TRAY [id number] TRAY EXPIRATION DATE:06/2014 Bupivacaine 13mg , fentanyl 2620mcg.epi. .1 injected intrathecally at 0912; patient tolerated well.

## 2013-10-26 ENCOUNTER — Inpatient Hospital Stay (HOSPITAL_COMMUNITY): Payer: Medicare Other

## 2013-10-26 DIAGNOSIS — S72009A Fracture of unspecified part of neck of unspecified femur, initial encounter for closed fracture: Secondary | ICD-10-CM

## 2013-10-26 DIAGNOSIS — D62 Acute posthemorrhagic anemia: Secondary | ICD-10-CM

## 2013-10-26 DIAGNOSIS — M25551 Pain in right hip: Secondary | ICD-10-CM

## 2013-10-26 DIAGNOSIS — D696 Thrombocytopenia, unspecified: Secondary | ICD-10-CM

## 2013-10-26 DIAGNOSIS — M25559 Pain in unspecified hip: Secondary | ICD-10-CM

## 2013-10-26 LAB — TYPE AND SCREEN
ABO/RH(D): O POS
Antibody Screen: NEGATIVE
UNIT DIVISION: 0
Unit division: 0
Unit division: 0
Unit division: 0
Unit division: 0
Unit division: 0

## 2013-10-26 LAB — CBC
HCT: 23.7 % — ABNORMAL LOW (ref 36.0–46.0)
HEMATOCRIT: 22.9 % — AB (ref 36.0–46.0)
HEMOGLOBIN: 8.1 g/dL — AB (ref 12.0–15.0)
HEMOGLOBIN: 8 g/dL — AB (ref 12.0–15.0)
MCH: 32.1 pg (ref 26.0–34.0)
MCH: 33.1 pg (ref 26.0–34.0)
MCHC: 34.2 g/dL (ref 30.0–36.0)
MCHC: 34.9 g/dL (ref 30.0–36.0)
MCV: 94 fL (ref 78.0–100.0)
MCV: 94.6 fL (ref 78.0–100.0)
PLATELETS: 123 10*3/uL — AB (ref 150–400)
Platelets: 118 10*3/uL — ABNORMAL LOW (ref 150–400)
RBC: 2.52 MIL/uL — ABNORMAL LOW (ref 3.87–5.11)
RBC: 2.42 MIL/uL — ABNORMAL LOW (ref 3.87–5.11)
RDW: 14.4 % (ref 11.5–15.5)
RDW: 14.5 % (ref 11.5–15.5)
WBC: 7.8 10*3/uL (ref 4.0–10.5)
WBC: 8.3 10*3/uL (ref 4.0–10.5)

## 2013-10-26 LAB — PREPARE RBC (CROSSMATCH)

## 2013-10-26 LAB — PROTIME-INR
INR: 1.51 — ABNORMAL HIGH (ref 0.00–1.49)
Prothrombin Time: 17.8 seconds — ABNORMAL HIGH (ref 11.6–15.2)

## 2013-10-26 LAB — BASIC METABOLIC PANEL
BUN: 26 mg/dL — ABNORMAL HIGH (ref 6–23)
CALCIUM: 7.8 mg/dL — AB (ref 8.4–10.5)
CO2: 26 meq/L (ref 19–32)
Chloride: 103 mEq/L (ref 96–112)
Creatinine, Ser: 0.75 mg/dL (ref 0.50–1.10)
GFR calc Af Amer: 81 mL/min — ABNORMAL LOW (ref >90)
GFR calc non Af Amer: 70 mL/min — ABNORMAL LOW (ref >90)
GLUCOSE: 115 mg/dL — AB (ref 70–99)
Potassium: 4.6 mEq/L (ref 3.7–5.3)
Sodium: 135 mEq/L — ABNORMAL LOW (ref 137–147)

## 2013-10-26 MED ORDER — MUPIROCIN 2 % EX OINT
1.0000 "application " | TOPICAL_OINTMENT | Freq: Two times a day (BID) | CUTANEOUS | Status: DC
  Administered 2013-10-26 – 2013-10-29 (×7): 1 via NASAL
  Filled 2013-10-26: qty 22

## 2013-10-26 MED ORDER — HYDROCODONE-ACETAMINOPHEN 7.5-325 MG PO TABS
1.0000 | ORAL_TABLET | ORAL | Status: DC
  Administered 2013-10-26: 1 via ORAL
  Filled 2013-10-26: qty 1

## 2013-10-26 MED ORDER — WARFARIN SODIUM 2 MG PO TABS: 3.0000 mg | ORAL_TABLET | ORAL | Status: DC

## 2013-10-26 MED ORDER — CHLORHEXIDINE GLUCONATE CLOTH 2 % EX PADS
6.0000 | MEDICATED_PAD | Freq: Every day | CUTANEOUS | Status: DC
  Administered 2013-10-26 – 2013-10-29 (×4): 6 via TOPICAL

## 2013-10-26 MED ORDER — WARFARIN SODIUM 5 MG PO TABS
5.0000 mg | ORAL_TABLET | Freq: Once | ORAL | Status: AC
  Administered 2013-10-26: 5 mg via ORAL
  Filled 2013-10-26: qty 1

## 2013-10-26 MED ORDER — METHOCARBAMOL 500 MG PO TABS
500.0000 mg | ORAL_TABLET | Freq: Four times a day (QID) | ORAL | Status: DC
  Administered 2013-10-26 – 2013-10-29 (×12): 500 mg via ORAL
  Filled 2013-10-26 (×12): qty 1

## 2013-10-26 MED ORDER — MORPHINE SULFATE 2 MG/ML IJ SOLN
2.0000 mg | INTRAMUSCULAR | Status: DC | PRN
  Administered 2013-10-27 – 2013-10-28 (×3): 2 mg via INTRAVENOUS
  Filled 2013-10-26 (×3): qty 1

## 2013-10-26 MED ORDER — BIOTENE DRY MOUTH MT LIQD
15.0000 mL | Freq: Two times a day (BID) | OROMUCOSAL | Status: DC
  Administered 2013-10-26 – 2013-10-29 (×6): 15 mL via OROMUCOSAL

## 2013-10-26 MED ORDER — MORPHINE SULFATE 4 MG/ML IJ SOLN
4.0000 mg | INTRAMUSCULAR | Status: DC | PRN
  Administered 2013-10-26: 4 mg via INTRAVENOUS
  Filled 2013-10-26: qty 1

## 2013-10-26 MED ORDER — WARFARIN - PHARMACIST DOSING INPATIENT: Freq: Every day | Status: DC

## 2013-10-26 MED ORDER — WARFARIN SODIUM 2.5 MG PO TABS: 4.5000 mg | ORAL_TABLET | ORAL | Status: DC

## 2013-10-26 NOTE — Progress Notes (Signed)
PROGRESS NOTE  Kim [name] [id number] DOB: 1917-09-08 DOA: 10/22/2013 PCP: Kim [name], [name], MD  Summary: 80 year old woman with a mechanical fall resulting in left proximal femur fracture. Admitted for further evaluation. History of atrial fibrillation on warfarin.  Assessment/Plan: 1. Left proximal femur fracture status post mechanical fall. Status post surgery 5/1. No chest pain or shortness of breath. 2. Acute blood loss anemia. Initially, suspected to be related to fracture. Status post 4 units packed or blood cells with stabilization. Significant drop postoperatively. No evidence of bleeding.  3. Right hip, thigh pain. Etiology, chronicity and significance unclear. Patient reporting less complaints of her son today. 4. Atrial fibrillation, maintained on warfarin as an outpatient. Rate controlled. Continue Coreg. INR has normalized. 5. Thrombocytopenia. Stable. Likely related to blood loss. Not on heparin products.   Postoperative management per orthopedics.  Type and cross 2 units. Check CBC at noon. May need further blood.  Continue to monitor thrombocytopenia.  Workup right hip pain of unclear significance. Check AP pelvis and right femur film. Further evaluation and recommendations per orthopedics.  Code Status: full code DVT prophylaxis: SCDs, resume warfarin when able. Lovenox when Hgb stable and cleared by orthopedics Family Communication: Discussed with sons at bedside Disposition Plan: likely SNF for short-term rehab  Brendia Sacksaniel Enzley Kitchens, MD  [name]  Pager [phone] If 7PM-7AM, please contact night-coverage at [email or web address], password [email or web address] 10/26/2013, 8:48 AM  LOS: 4 days   Consultants:  Orthopedics   Procedures:  4/29 Transfusion 2 units packed red blood cells  4/30 transfusion 2 units packed red blood cells  5/1 INTRAMEDULLARY (IM) NAIL FEMORAL WITH DALL MILES CABLE TO LEFT FEMUR (Left) - BIOMET  HPI/Subjective: Reports left hip pain improved. She  reports right hip pain today. History vague, cannot report exactly when this started. No chest pain or shortness of breath. Hungry. No abdominal pain.  Objective: Filed Vitals:   10/26/13 0342 10/26/13 0533 10/26/13 0708 10/26/13 0736  BP:  85/53 111/66   Pulse:  124    Temp:  99.1 F (37.3 C)    TempSrc:  Oral    Resp: 18 18  12   Height:      Weight:      SpO2: 97% 100%  100%    Intake/Output Summary (Last 24 hours) at 10/26/13 0848 Last data filed at 10/26/13 0700  Gross per 24 hour  Intake   4240 ml  Output   1200 ml  Net   3040 ml     Filed Weights   10/22/13 1235 10/23/13 0423  Weight: 58.514 kg (129 lb) 58.242 kg (128 lb 6.4 oz)    Exam:   Afebrile, vital signs stable. Minimal tachycardia. Normotensive.  Gen. Appears calm, mildly uncomfortable. Nontoxic.  Respiratory clear to auscultation bilaterally. No wheezes, rales or rhonchi. Normal respiratory effort.  Cardiovascular. Irregular, tachycardic. No murmur, rub or gallop. No pedal edema. Left thigh edema without change. There may be some right thigh edema. Telemetry atrial fibrillation, heart rate 90-110s.  Abdomen soft and nontender.  Left thigh edematous without change. Incision dressed.  Right thigh may have some edema. There is no bruising evident. Nontender to palpation. Right foot warm, dry with grossly normal perfusion. Limited range of movement of the right leg secondary to pain but appears intact.  Data Reviewed:  Basic metabolic panel unremarkable.  Hemoglobin 12.0 >> 8.1 postoperatively  Platelet count stable, 128 >> 123  INR 1.51  Scheduled Meds: . acetaminophen  500 mg Oral QHS  . antiseptic oral rinse  15 mL Mouth Rinse BID  . carvedilol  3.125 mg Oral BID WC  . docusate sodium  100 mg Oral BID  . fluticasone  2 spray Each Nare QHS  . senna  1 tablet Oral BID  . [START ON 10/28/2013] warfarin  3 mg Oral Once per day on Mon Fri  . warfarin  4.5 mg Oral Once per day on Sun Tue Wed Thu  Sat  . Warfarin - Pharmacist Dosing Inpatient   Does not apply q1800   Continuous Infusions: . 0.9 % NaCl with KCl 20 mEq / L 100 mL/hr at 10/26/13 0202    Principal Problem:   Femur fracture, left Active Problems:   FIBRILLATION, ATRIAL   OSTEOPOROSIS   Chronic anticoagulation   Fracture of hip, left, closed   Normocytic anemia   Subtrochanteric fracture of left femur   Time spent 20 minutes

## 2013-10-26 NOTE — Progress Notes (Signed)
Subjective: 1 Day Post-Op Procedure(s) (LRB): INTRAMEDULLARY (IM) NAIL FEMORAL WITH DALL MILES CABLE TO LEFT FEMUR (Left) Patient reports pain as moderate.    Objective: Vital signs in last 24 hours: Temp:  [97.2 F (36.2 C)-99.8 F (37.7 C)] 99.1 F (37.3 C) (05/02 0533) Pulse Rate:  [77-124] 124 (05/02 0533) Resp:  [12-19] 12 (05/02 0736) BP: (85-131)/(53-86) 111/66 mmHg (05/02 0708) SpO2:  [95 %-100 %] 100 % (05/02 0736)  Intake/Output from previous day: 05/01 0701 - 05/02 0700 In: 4240 [P.O.:480; I.V.:3660; IV Piggyback:100] Out: 1200 [Urine:900; Blood:300] Intake/Output this shift: Total I/O In: 180 [P.O.:180] Out: -    Recent Labs  10/24/13 0615 10/24/13 2330 10/25/13 0613 10/26/13 0549  HGB 9.4* 11.6* 12.0 8.1*    Recent Labs  10/25/13 0613 10/26/13 0549  WBC 11.0* 7.8  RBC 3.76* 2.52*  HCT 34.8* 23.7*  PLT 128* 123*    Recent Labs  10/25/13 0613 10/26/13 0549  NA 135* 135*  K 4.1 4.6  CL 101 103  CO2 24 26  BUN 32* 26*  CREATININE 0.70 0.75  GLUCOSE 118* 115*  CALCIUM 8.5 7.8*    Recent Labs  10/25/13 0613 10/26/13 0549  INR 1.31 1.51*    right hip and back pain xrays neg exam back tender, most likely from positioning during surgery   Assessment/Plan: 1 Day Post-Op Procedure(s) (LRB): INTRAMEDULLARY (IM) NAIL FEMORAL WITH DALL MILES CABLE TO LEFT FEMUR (Left) Transfuse for anemia Robaxin for back pain  Increase norco and morphine   Vickki HearingStanley E Marceil Welp 10/26/2013, 10:30 AM

## 2013-10-26 NOTE — Addendum Note (Signed)
Addendum created 10/26/13 1330 by Earleen NewportAmy A Adams, CRNA   Modules edited: Notes Section   Notes Section:  File: [id number]

## 2013-10-26 NOTE — Evaluation (Signed)
Physical Therapy Evaluation Patient Details Name: Kim EldersMary M Runnels MRN: [id number] DOB: 1918-06-08 Today's Date: 10/26/2013   History of Present Illness  -year-old woman with a mechanical fall resulting in left proximal femur fracture. Admitted for further evaluation. History of atrial fibrillation on warfarin, s/p intramedullary nailing done. recently c/o right hip pain too, X- rays to be negative.  Clinical Impression  80 years old female seen in supine lying position , awake and alert , however lethargic sec to pain and surgery procedure, performed bed mobility with max A and HOB elevated , able to sit at EOB with BL UE supported and leaning on right side sec to pain hence trying to non weight bear on LLE for upto 15 mins  , patient has c/o R hip too , nursing aware and as per nursing Xrays done which came out negative, patient limited with fatigue and pain so positioned back in bed with LE well supported, dc plan to SNF which is appropriate at this time.    Follow Up Recommendations Home health PT    Equipment Recommendations       Recommendations for Other Services       Precautions / Restrictions        Mobility  Bed Mobility Overal bed mobility: Needs Assistance Bed Mobility: Supine to Sit;Sit to Supine     Supine to sit: Max assist Sit to supine: Max assist   General bed mobility comments: patient not able to bear weight on operated L LE in sitting sec to pain.  Transfers                    Ambulation/Gait                Stairs            Wheelchair Mobility    Modified Rankin (Stroke Patients Only)       Balance Overall balance assessment: Needs assistance Sitting-balance support: Bilateral upper extremity supported Sitting balance-Leahy Scale: Poor                                       Pertinent Vitals/Pain      Home Living   Living Arrangements: Alone                    Prior Function Level of  Independence: Independent         Comments: with RW     Hand Dominance        Extremity/Trunk Assessment                         Communication      Cognition Arousal/Alertness: Awake/alert Behavior During Therapy: WFL for tasks assessed/performed Overall Cognitive Status: Within Functional Limits for tasks assessed                      General Comments      Exercises Total Joint Exercises Ankle Circles/Pumps: Both;AROM;15 reps;Supine Gluteal Sets: AROM;Both;10 reps;Supine Heel Slides: AAROM;Left;10 reps;Supine Straight Leg Raises: AAROM;Left;10 reps;Supine      Assessment/Plan    PT Assessment Patient needs continued PT services  PT Diagnosis Abnormality of gait;Acute pain   PT Problem List Decreased strength;Decreased knowledge of use of DME;Decreased activity tolerance;Decreased balance;Decreased mobility  PT Treatment Interventions DME instruction;Gait training;Therapeutic activities;Therapeutic exercise;Balance training   PT Goals (Current goals  can be found in the Care Plan section) Acute Rehab PT Goals Patient Stated Goal: to be able to go home PT Goal Formulation: With patient Time For Goal Achievement: 11/09/13 Potential to Achieve Goals: Good    Frequency 7X/week   Barriers to discharge Decreased caregiver support      Co-evaluation               End of Session Equipment Utilized During Treatment: Gait belt Activity Tolerance: Patient limited by pain Patient left: in bed;with bed alarm set Nurse Communication: Mobility status         Time: [id number] PT Time Calculation (min): 33 min   Charges:   PT Evaluation $Initial PT Evaluation Tier I: 1 Procedure PT Treatments $Therapeutic Exercise: 8-22 mins $Therapeutic Activity: 23-37 mins   PT G Codes:          Ok EdwardsUrvi V Anders Hohmann 10/26/2013, 2:44 PM

## 2013-10-26 NOTE — Progress Notes (Signed)
ANTICOAGULATION CONSULT NOTE - Initial Consult  Pharmacy Consult for Coumadin Indication: atrial fibrillation, post-op VTE prophylaxis  Allergies  Allergen Reactions  . Nitrofurantoin Rash    Patient Measurements: Height: 5\' 4"  (162.6 cm) Weight: 128 lb 6.4 oz (58.242 kg) IBW/kg (Calculated) : 54.7  Vital Signs: Temp: 99.1 F (37.3 C) (05/02 0533) Temp src: Oral (05/02 0533) BP: 111/66 mmHg (05/02 0708) Pulse Rate: 124 (05/02 0533)  Labs:  Recent Labs  10/24/13 0615 10/24/13 0830 10/24/13 2330 10/25/13 0613 10/26/13 0549  HGB 9.4*  --  11.6* 12.0 8.1*  HCT 27.7*  --  33.3* 34.8* 23.7*  PLT 128*  --  122* 128* 123*  LABPROT  --  16.4*  --  16.0* 17.8*  INR  --  1.36  --  1.31 1.51*  CREATININE 1.10  --   --  0.70 0.75    Estimated Creatinine Clearance: 37.1 ml/min (by C-G formula based on Cr of 0.75).   Medical History: Past Medical History  Diagnosis Date  . Atrial fibrillation   . Osteoporosis     Knee; hip  . Wrist fracture 1988    Left  . Detached retina, right   . Chronic anticoagulation   . Compression fracture of thoracic spine, non-traumatic   . Coronary artery disease     Medications:  Prescriptions prior to admission  Medication Sig Dispense Refill  . acetaminophen (TYLENOL) 500 MG tablet Take 500 mg by mouth at bedtime. *takes one hour before bedtime for rest      . Ascorbic Acid (VITAMIN C) 500 MG tablet Take 500 mg by mouth daily.        . Calcium Carbonate (CALTRATE 600 PO) Take 1 capsule by mouth every morning.       . carvedilol (COREG) 3.125 MG tablet Take 3.125 mg by mouth 2 (two) times daily with a meal.        . fish oil-omega-3 fatty acids 1000 MG capsule Take 1 g by mouth every morning.       . fluticasone (VERAMYST) 27.5 MCG/SPRAY nasal spray Place 2 sprays into the nose at bedtime.      . Multiple Vitamins-Minerals (MULTIVITAMIN WITH MINERALS) tablet Take 1 tablet by mouth every morning.       . Potassium Bicarbonate 99 MG CAPS  Take 1 capsule by mouth every morning.       . warfarin (COUMADIN) 3 MG tablet Take 3-4.5 mg by mouth daily. Takes one and one-half tablet every day (4.5mg  total), except takes one tablet on Mondays and Fridays        Assessment: 80 yo F on chronic warfarin for Afib.  Home dose listed above.  She was admitted with left femur fracture and is s/p repair POD#1.   INR was sub-therapeutic on admission.  She received a total of Vitamin K 11mg  to further reverse INR for surgery.   Acute post-op anemia noted.    Goal of Therapy:  INR 2-3   Plan:  Coumadin 5mg  po x1 today Daily INR  Mercy RidingAndrea Michelle Raylynne Cubbage 10/26/2013,8:56 AM

## 2013-10-26 NOTE — Anesthesia Postprocedure Evaluation (Signed)
  Anesthesia Post-op Note  Patient: Kim [name]  Procedure(s) Performed: Procedure(s) with comments: INTRAMEDULLARY (IM) NAIL FEMORAL WITH DALL MILES CABLE TO LEFT FEMUR (Left) - BIOMET   Patient Location: Room 338  Anesthesia Type:Spinal  Level of Consciousness: awake, alert , oriented and patient cooperative  Airway and Oxygen Therapy: Patient Spontanous Breathing  Post-op Pain: mild  Post-op Assessment: Post-op Vital signs reviewed, Patient's Cardiovascular Status Stable, Respiratory Function Stable, Patent Airway, No signs of Nausea or vomiting and Pain level controlled  Post-op Vital Signs: Reviewed and stable  Last Vitals:  Filed Vitals:   10/26/13 1200  BP:   Pulse:   Temp:   Resp: 14    Complications: No apparent anesthesia complications

## 2013-10-27 LAB — BASIC METABOLIC PANEL
BUN: 25 mg/dL — ABNORMAL HIGH (ref 6–23)
CHLORIDE: 100 meq/L (ref 96–112)
CO2: 26 meq/L (ref 19–32)
CREATININE: 0.68 mg/dL (ref 0.50–1.10)
Calcium: 8 mg/dL — ABNORMAL LOW (ref 8.4–10.5)
GFR calc Af Amer: 84 mL/min — ABNORMAL LOW (ref >90)
GFR calc non Af Amer: 73 mL/min — ABNORMAL LOW (ref >90)
Glucose, Bld: 112 mg/dL — ABNORMAL HIGH (ref 70–99)
Potassium: 4.2 mEq/L (ref 3.7–5.3)
Sodium: 133 mEq/L — ABNORMAL LOW (ref 137–147)

## 2013-10-27 LAB — CBC
HCT: 31.4 % — ABNORMAL LOW (ref 36.0–46.0)
HEMOGLOBIN: 10.7 g/dL — AB (ref 12.0–15.0)
MCH: 31.4 pg (ref 26.0–34.0)
MCHC: 34.1 g/dL (ref 30.0–36.0)
MCV: 92.1 fL (ref 78.0–100.0)
PLATELETS: 132 10*3/uL — AB (ref 150–400)
RBC: 3.41 MIL/uL — AB (ref 3.87–5.11)
RDW: 15.9 % — ABNORMAL HIGH (ref 11.5–15.5)
WBC: 9.3 10*3/uL (ref 4.0–10.5)

## 2013-10-27 LAB — TYPE AND SCREEN
ABO/RH(D): O POS
Antibody Screen: NEGATIVE
UNIT DIVISION: 0
Unit division: 0

## 2013-10-27 LAB — PROTIME-INR
INR: 1.41 (ref 0.00–1.49)
PROTHROMBIN TIME: 16.9 s — AB (ref 11.6–15.2)

## 2013-10-27 MED ORDER — GABAPENTIN 100 MG PO CAPS
100.0000 mg | ORAL_CAPSULE | Freq: Three times a day (TID) | ORAL | Status: DC
  Administered 2013-10-27 (×3): 100 mg via ORAL
  Filled 2013-10-27 (×3): qty 1

## 2013-10-27 MED ORDER — WARFARIN SODIUM 5 MG PO TABS
5.0000 mg | ORAL_TABLET | Freq: Once | ORAL | Status: AC
  Administered 2013-10-27: 5 mg via ORAL
  Filled 2013-10-27: qty 1

## 2013-10-27 MED ORDER — METHYLPREDNISOLONE SODIUM SUCC 40 MG IJ SOLR
40.0000 mg | Freq: Four times a day (QID) | INTRAMUSCULAR | Status: AC
  Administered 2013-10-27 – 2013-10-28 (×4): 40 mg via INTRAVENOUS
  Filled 2013-10-27 (×5): qty 1

## 2013-10-27 NOTE — Progress Notes (Signed)
PROGRESS NOTE  Kim EldersMary M [name] RUE:[id number] DOB: 01-25-1918 DOA: 10/22/2013 PCP: [name], [name], MD  Summary: 80 year old woman with a mechanical fall resulting in left proximal femur fracture. Admitted for further evaluation. History of atrial fibrillation on warfarin.  Assessment/Plan: 1. Left proximal femur fracture status post mechanical fall. Status post surgery 5/1. No chest pain or shortness of breath. 2. Acute blood loss anemia. Suspected to be related to fracture. Status post 6 units packed red blood cells Significant drop postoperatively but likely stable no transfusion. 3. Right hip, thigh pain. Resolving. X-rays negative. Likely related to positioning during surgery. 4. Atrial fibrillation, maintained on warfarin as an outpatient. Rate controlled. Continue Coreg.  5. Thrombocytopenia. Stable. Likely related to blood loss. Not on heparin products.   Continue postoperative care per orthopedics. Warfarin performed superior  CBC in the morning.  Anticipated placement skilled nursing facility soon.  Bowel regimen.  Code Status: full code DVT prophylaxis: SCDs, warfarin Family Communication: Discussed with son, daughter at bedside Disposition Plan: SNF for short-term rehab  Brendia Sacksaniel Lennix Kneisel, MD  [name]  Pager [phone] If 7PM-7AM, please contact night-coverage at [email or web address], password [email or web address] 10/27/2013, 4:03 PM  LOS: 5 days   Consultants:  Orthopedics   Procedures:  4/29 Transfusion 2 units packed red blood cells  4/30 transfusion 2 units packed red blood cells   5/2 transfusion 2 units packed red blood cells   5/1 INTRAMEDULLARY (IM) NAIL FEMORAL WITH DALL MILES CABLE TO LEFT FEMUR (Left) - BIOMET  HPI/Subjective: Feels better today. Less right hip pain.  Objective: Filed Vitals:   10/27/13 0721 10/27/13 1200 10/27/13 1447 10/27/13 1555  BP: 98/58  106/69   Pulse: 102  112   Temp: 97.7 F (36.5 C)  98.4 F (36.9 C)   TempSrc: Oral  Oral   Resp:  18 16 18 18   Height:      Weight:      SpO2:  98% 96% 97%    Intake/Output Summary (Last 24 hours) at 10/27/13 1603 Last data filed at 10/27/13 1356  Gross per 24 hour  Intake    360 ml  Output      0 ml  Net    360 ml     Filed Weights   10/22/13 1235 10/23/13 0423  Weight: 58.514 kg (129 lb) 58.242 kg (128 lb 6.4 oz)    Exam:   Afebrile, vital signs stable. Normotensive. No hypoxia.  Gen. Appears calm and comfortable. Speech fluent and clear.  Cardiovascular. Irregular. No murmur, rub or gallop. No lower extremity edema. Telemetry atrial fibrillation with heart rate 90-100s.  Respiratory. Clear to auscultation bilaterally. No wheezes, rales or rhonchi. Normal respiratory effort.  Psychiatric. Grossly normal mood and affect. Speech fluent and appropriate.  Data Reviewed:  Basic metabolic panel unremarkable.  Hemoglobin 10.7 status post transfusion  Platelet count stable, 128 >> 123 >> 132  INR 1.41  Scheduled Meds: . acetaminophen  500 mg Oral QHS  . antiseptic oral rinse  15 mL Mouth Rinse BID  . carvedilol  3.125 mg Oral BID WC  . Chlorhexidine Gluconate Cloth  6 each Topical Q0600  . docusate sodium  100 mg Oral BID  . fluticasone  2 spray Each Nare QHS  . gabapentin  100 mg Oral TID  . methocarbamol  500 mg Oral QID  . methylPREDNISolone (SOLU-MEDROL) injection  40 mg Intravenous Q6H  . mupirocin ointment  1 application Nasal BID  . senna  1 tablet Oral BID  . warfarin  5 mg Oral Once  . Warfarin - Pharmacist Dosing Inpatient   Does not apply q1800   Continuous Infusions:    Principal Problem:   Femur fracture, left Active Problems:   FIBRILLATION, ATRIAL   OSTEOPOROSIS   Chronic anticoagulation   Fracture of hip, left, closed   Normocytic anemia   Subtrochanteric fracture of left femur   Acute blood loss anemia   Thrombocytopenia, unspecified   Right hip pain   Time spent 15 minutes

## 2013-10-27 NOTE — Progress Notes (Signed)
ANTICOAGULATION CONSULT NOTE  Pharmacy Consult for Coumadin Indication: atrial fibrillation, post-op VTE prophylaxis  Allergies  Allergen Reactions  . Nitrofurantoin Rash    Patient Measurements: Height: 5\' 4"  (162.6 cm) Weight: 128 lb 6.4 oz (58.242 kg) IBW/kg (Calculated) : 54.7  Vital Signs: Temp: 97.7 F (36.5 C) (05/03 0721) Temp src: Oral (05/03 0721) BP: 98/58 mmHg (05/03 0721) Pulse Rate: 102 (05/03 0721)  Labs:  Recent Labs  10/25/13 0613 10/26/13 0549 10/26/13 1130 10/27/13 0503  HGB 12.0 8.1* 8.0* 10.7*  HCT 34.8* 23.7* 22.9* 31.4*  PLT 128* 123* 118* 132*  LABPROT 16.0* 17.8*  --  16.9*  INR 1.31 1.51*  --  1.41  CREATININE 0.70 0.75  --  0.68    Estimated Creatinine Clearance: 37.1 ml/min (by C-G formula based on Cr of 0.68).   Medical History: Past Medical History  Diagnosis Date  . Atrial fibrillation   . Osteoporosis     Knee; hip  . Wrist fracture 1988    Left  . Detached retina, right   . Chronic anticoagulation   . Compression fracture of thoracic spine, non-traumatic   . Coronary artery disease     Medications:  Prescriptions prior to admission  Medication Sig Dispense Refill  . acetaminophen (TYLENOL) 500 MG tablet Take 500 mg by mouth at bedtime. *takes one hour before bedtime for rest      . Ascorbic Acid (VITAMIN C) 500 MG tablet Take 500 mg by mouth daily.        . Calcium Carbonate (CALTRATE 600 PO) Take 1 capsule by mouth every morning.       . carvedilol (COREG) 3.125 MG tablet Take 3.125 mg by mouth 2 (two) times daily with a meal.        . fish oil-omega-3 fatty acids 1000 MG capsule Take 1 g by mouth every morning.       . fluticasone (VERAMYST) 27.5 MCG/SPRAY nasal spray Place 2 sprays into the nose at bedtime.      . Multiple Vitamins-Minerals (MULTIVITAMIN WITH MINERALS) tablet Take 1 tablet by mouth every morning.       . Potassium Bicarbonate 99 MG CAPS Take 1 capsule by mouth every morning.       . warfarin  (COUMADIN) 3 MG tablet Take 3-4.5 mg by mouth daily. Takes one and one-half tablet every day (4.5mg  total), except takes one tablet on Mondays and Fridays        Assessment: 80 yo F on chronic warfarin for Afib.  Home dose listed above.  She was admitted with left femur fracture and is s/p repair POD#2.   INR was sub-therapeutic on admission.  She received a total of Vitamin K 11mg  to further reverse INR for surgery.   Acute post-op anemia noted.  This improved after transfusion.   Goal of Therapy:  INR 2-3   Plan:  Coumadin 5mg  po x1 today Daily INR  Mercy RidingAndrea Michelle Rodrecus Belsky 10/27/2013,10:13 AM

## 2013-10-27 NOTE — Progress Notes (Signed)
Subjective: 2 Days Post-Op Procedure(s) (LRB): INTRAMEDULLARY (IM) NAIL FEMORAL WITH DALL MILES CABLE TO LEFT FEMUR (Left) Patient reports pain as moderate.  Right lower back   Objective: Vital signs in last 24 hours: Temp:  [97.7 F (36.5 C)-98.4 F (36.9 C)] 97.7 F (36.5 C) (05/03 0721) Pulse Rate:  [102-118] 102 (05/03 0721) Resp:  [14-18] 18 (05/03 0721) BP: (91-115)/(55-73) 98/58 mmHg (05/03 0721) SpO2:  [98 %-100 %] 98 % (05/03 0400)  Intake/Output from previous day: 05/02 0701 - 05/03 0700 In: 420 [P.O.:420] Out: -  Intake/Output this shift:     Recent Labs  10/24/13 2330 10/25/13 0613 10/26/13 0549 10/26/13 1130 10/27/13 0503  HGB 11.6* 12.0 8.1* 8.0* 10.7*    Recent Labs  10/26/13 1130 10/27/13 0503  WBC 8.3 9.3  RBC 2.42* 3.41*  HCT 22.9* 31.4*  PLT 118* 132*    Recent Labs  10/26/13 0549 10/27/13 0503  NA 135* 133*  K 4.6 4.2  CL 103 100  CO2 26 26  BUN 26* 25*  CREATININE 0.75 0.68  GLUCOSE 115* 112*  CALCIUM 7.8* 8.0*    Recent Labs  10/26/13 0549 10/27/13 0503  INR 1.51* 1.41    Neurologically intact Neurovascular intact Sensation intact distally Intact pulses distally Dorsiflexion/Plantar flexion intact Incision: dressing C/D/I  Assessment/Plan: 2 Days Post-Op Procedure(s) (LRB): INTRAMEDULLARY (IM) NAIL FEMORAL WITH DALL MILES CABLE TO LEFT FEMUR (Left)  Back pain with radiation add steroids and gabapentin    Vickki HearingStanley E Tamyrah Burbage 10/27/2013, 8:11 AM

## 2013-10-28 ENCOUNTER — Inpatient Hospital Stay (HOSPITAL_COMMUNITY): Payer: Medicare Other

## 2013-10-28 LAB — CBC
HCT: 30.6 % — ABNORMAL LOW (ref 36.0–46.0)
HEMOGLOBIN: 10.5 g/dL — AB (ref 12.0–15.0)
MCH: 31.5 pg (ref 26.0–34.0)
MCHC: 34.3 g/dL (ref 30.0–36.0)
MCV: 91.9 fL (ref 78.0–100.0)
Platelets: 151 10*3/uL (ref 150–400)
RBC: 3.33 MIL/uL — AB (ref 3.87–5.11)
RDW: 15.2 % (ref 11.5–15.5)
WBC: 8.3 10*3/uL (ref 4.0–10.5)

## 2013-10-28 LAB — PROTIME-INR
INR: 1.63 — ABNORMAL HIGH (ref 0.00–1.49)
Prothrombin Time: 18.9 seconds — ABNORMAL HIGH (ref 11.6–15.2)

## 2013-10-28 MED ORDER — METHYLPREDNISOLONE SODIUM SUCC 125 MG IJ SOLR
60.0000 mg | Freq: Four times a day (QID) | INTRAMUSCULAR | Status: AC
  Administered 2013-10-28 (×4): 60 mg via INTRAVENOUS
  Filled 2013-10-28 (×4): qty 2

## 2013-10-28 MED ORDER — LORAZEPAM 0.5 MG PO TABS
0.5000 mg | ORAL_TABLET | Freq: Four times a day (QID) | ORAL | Status: DC | PRN
  Administered 2013-10-28 (×2): 0.5 mg via ORAL
  Filled 2013-10-28 (×2): qty 1

## 2013-10-28 MED ORDER — GABAPENTIN 100 MG PO CAPS
200.0000 mg | ORAL_CAPSULE | Freq: Three times a day (TID) | ORAL | Status: DC
  Administered 2013-10-28 – 2013-10-29 (×4): 200 mg via ORAL
  Filled 2013-10-28 (×4): qty 2

## 2013-10-28 MED ORDER — WARFARIN SODIUM 5 MG PO TABS
5.0000 mg | ORAL_TABLET | Freq: Once | ORAL | Status: AC
  Administered 2013-10-28: 5 mg via ORAL
  Filled 2013-10-28: qty 1

## 2013-10-28 NOTE — Clinical Social Work Placement (Signed)
     Clinical Social Work Department CLINICAL SOCIAL WORK PLACEMENT NOTE 10/29/2013  Patient:  Kim [name],Kim [name]  Account Number:  [id number] Admit date:  10/22/2013  Clinical Social Worker:  Santa GeneraANNE Orlinda Slomski, CLINICAL SOCIAL WORKER  Date/time:  10/28/2013 10:00 AM  Clinical Social Work is seeking post-discharge placement for this patient at the following level of care:   SKILLED NURSING   (*CSW will update this form in Epic as items are completed)   10/28/2013  Patient/family provided with [facility] of Clinical Social Works list of facilities offering this level of care within the geographic area requested by the patient (or if unable, by the patients family).  10/28/2013  Patient/family informed of their freedom to choose among providers that offer the needed level of care, that participate in Medicare, Medicaid or managed care program needed by the patient, have an available bed and are willing to accept the patient.  10/28/2013  Patient/family informed of [facility] ownership interest in [facility], as well as of the fact that they are under no obligation to receive care at this facility.  PASARR submitted to EDS on 10/28/2013 PASARR number received from EDS on 10/28/2013  FL2 transmitted to all facilities in geographic area requested by pt/family on  10/28/2013 FL2 transmitted to all facilities within larger geographic area on   Patient informed that his/her managed care company has contracts with or will negotiate with  certain facilities, including the following:     Patient/family informed of bed offers received:  10/29/2013 [facility] Physician recommends and patient chooses bed at    [facility] on  10/29/2013 [facility]  The following physician request were entered in Epic:   Additional Comments:

## 2013-10-28 NOTE — Progress Notes (Signed)
Physical Therapy Treatment Patient Details Name: Kim [name] MRN: [id number] DOB: [birth year] Today's Date: 10/28/2013    History of Present Illness -year-old woman with a mechanical fall resulting in left proximal femur fracture. Admitted for further evaluation. History of atrial fibrillation on warfarin, s/p intramedullary nailing done. recently c/o right hip pain too, X- rays to be negative.    PT Comments    Pt requests to stay in bed this session. Pt completes bed exercises well after intial cueing and demo. Pt requires manual assistance with SLS and heel slides. Educated pt on the importance of getting out of bed to limit deconditioning and muscle atrophy.   Follow Up Recommendations  SNF           Precautions / Restrictions Precautions Precautions: Fall Restrictions LLE Weight Bearing: Partial weight bearing          Cognition Arousal/Alertness: Awake/alert Behavior During Therapy: WFL for tasks assessed/performed Overall Cognitive Status: Within Functional Limits for tasks assessed                      Exercises Total Joint Exercises Ankle Circles/Pumps: Both;AROM;15 reps;Supine Quad Sets: AROM;Both;10 reps;Supine Gluteal Sets: AROM;Both;10 reps;Supine Heel Slides: AAROM;Left;10 reps;Supine Straight Leg Raises: AAROM;Left;10 reps;Supine        Pertinent Vitals/Pain 8/10 L thigh pain; pt medicated prior to session    Home Living Family/patient expects to be discharged to:: Skilled nursing facility Living Arrangements: Alone                  Prior Function Level of Independence: Needs assistance  Gait / Transfers Assistance Needed: Ambulates with RW ADL's / Homemaking Assistance Needed: Pt was independent in all ADLS, but was receinv some assist with IADLs.  Pt has aide come to the house Tuesdays and Thursdays to assist with housecleaning and driving for errands.     PT Goals (current goals can now be found in the care plan section) Acute  Rehab PT Goals Patient Stated Goal: to get back to shopping and going to the beauty parlor like I had been Progress towards PT goals: Progressing toward goals       PT Plan Current plan remains appropriate       End of Session Equipment Utilized During Treatment: Gait belt Activity Tolerance: Patient limited by pain Patient left: in bed;with nursing/sitter in room;with call bell/phone within reach (RN informed CT scan, requested pt left in bed)     Time: 1620-1640 PT Time Calculation (min): 20 min  Charges:  $Therapeutic Exercise: 8-22 mins                     Seth Bakeebekah Shaquan Missey, PTA  10/28/2013, 4:47 PM

## 2013-10-28 NOTE — Clinical Social Work Psychosocial (Signed)
Clinical Social Work Department BRIEF PSYCHOSOCIAL ASSESSMENT 10/28/2013  Patient:  Kim [name], Kim [name]     Account Number:  [id number]     Admit date:  10/22/2013  Clinical Social Worker:  Edwyna Shell, [place]  Date/Time:  10/28/2013 09:00 AM  Referred by:  Physician  Date Referred:  10/28/2013 Referred for  SNF Placement   Other Referral:   Interview type:  Patient Other interview type:    PSYCHOSOCIAL DATA Living Status:  ALONE Admitted from facility:   Level of care:   Primary support name:  Teddy Spike Primary support relationship to patient:  CHILD, ADULT Degree of support available:   Patient says two children living in [place] check on her frequently and are supportive.  CSW att    CURRENT CONCERNS Current Concerns  Post-Acute Placement   Other Concerns:    SOCIAL WORK ASSESSMENT / PLAN CSW met w patient at bedside, patient alert and oriented x4.  Patient had surgery for hip fracture 3 days ago, complaining of significant pain in other leg today.  Lives alone, has been fully independent, walks w walker.  Has two children who live in [place] and check on her frequently.  Is widowed.    Patient wiliing to go to SNF rehab for intensive PT in order to return home as soon as possible.  CSW explained discharge planning process and possibility of copays/deductibles.  Patient agreeable, wants son involved. CSW left VM requesting son to call.    Patient expressed frustration - "I keep doing things to myself."  Current fall occurred when she was on a vinyl floor - "my leg just gave out on me."  Also fell while cleaning closet, hand became caught in rolling walker and was "pinched."  Patient frustrated w ongoing  physical issues, wants to be independent and able to care for self. Currently experiencing significant pain from muscle spasms as well.   Assessment/plan status:  Psychosocial Support/Ongoing Assessment of Needs Other assessment/ plan:    Information/referral to community resources:   SNF list    PATIENT'S/FAMILY'S RESPONSE TO PLAN OF CARE: Patient frustrated w what seem to her continuous physical problems and recurrent issues.  Willing to go to rehab, wants her independence.    Edwyna Shell, LCSW Clinical Social Worker [phone])

## 2013-10-28 NOTE — Progress Notes (Signed)
PROGRESS NOTE  Kim [name] RUE:[id number] DOB: Mar 04, 1918 DOA: 10/22/2013 PCP: [name], [name], MD  Summary: 80 year old woman with a mechanical fall resulting in left proximal femur fracture. Surgery was delayed by elevated INR and acute blood loss anemia presumably from fracture. Status post surgery 5/1 without apparent complication. Postoperatively complaining of right hip and thigh pain, further evaluation underway. When stable plan transfer to skilled nursing facility.  Assessment/Plan: 1. Left proximal femur fracture status post mechanical fall. Status post surgery 5/1.  2. Acute blood loss anemia. Stable. Suspected to be related to fracture. Status post 6 units packed red blood cells 3. Right hip, thigh pain. Recurrent, etiology unclear. X-rays negative. Orthopedics pursuing CT scan of lumbar spine 4. Atrial fibrillation, maintained on warfarin as an outpatient. Rate controlled. Continue Coreg.  5. Thrombocytopenia. Resolved. Likely related to blood loss.    Continue postoperative care per orthopedics. Warfarin resumed.  Anticipated placement skilled nursing facility soon.  Bowel regimen.  Code Status: full code DVT prophylaxis: SCDs, warfarin Family Communication: Discussed with son Disposition Plan: SNF for short-term rehab  [name], MD  [name]  Pager [phone] If 7PM-7AM, please contact night-coverage at [email or web address], password [email or web address] 10/28/2013, 4:56 PM  LOS: 6 days   Consultants:  Orthopedics   Procedures:  4/29 Transfusion 2 units packed red blood cells  4/30 transfusion 2 units packed red blood cells   5/2 transfusion 2 units packed red blood cells   5/1 INTRAMEDULLARY (IM) NAIL FEMORAL WITH DALL MILES CABLE TO LEFT FEMUR (Left) - BIOMET  HPI/Subjective: Has more right hip, but mostly right thigh, pain. No low back pain, no lower right leg pain. Tolerating diet.  Objective: Filed Vitals:   10/28/13 0000 10/28/13 0344 10/28/13 0530  10/28/13 1651  BP:   116/75 98/57  Pulse:   102 99  Temp:   98.1 F (36.7 C) 98.5 F (36.9 C)  TempSrc:   Oral Oral  Resp: 20 16 16 20   Height:      Weight:      SpO2: 93% 95% 97% 96%    Intake/Output Summary (Last 24 hours) at 10/28/13 1656 Last data filed at 10/28/13 0500  Gross per 24 hour  Intake    240 ml  Output      0 ml  Net    240 ml     Filed Weights   10/22/13 1235 10/23/13 0423  Weight: 58.514 kg (129 lb) 58.242 kg (128 lb 6.4 oz)    Exam:   Afebrile, vital signs stable. Normotensive. No hypoxia.  General: appears calm and mildly uncomfortable. Speech fluent and clear.  Cardiovascular. Regular rate and rhythm. No murmur, rub or gallop.  Respiratory clear to auscultation bilaterally. No wheezes, rales or rhonchi. Normal respiratory effort.  Abdomen soft.  Right hip and thigh appear unremarkable, nontender to palpation. Limited range of movement secondary to pain.  Data Reviewed:  CBC stable  Scheduled Meds: . acetaminophen  500 mg Oral QHS  . antiseptic oral rinse  15 mL Mouth Rinse BID  . carvedilol  3.125 mg Oral BID WC  . Chlorhexidine Gluconate Cloth  6 each Topical Q0600  . docusate sodium  100 mg Oral BID  . fluticasone  2 spray Each Nare QHS  . gabapentin  200 mg Oral TID  . methocarbamol  500 mg Oral QID  . methylPREDNISolone (SOLU-MEDROL) injection  60 mg Intravenous 4 times per day  . mupirocin ointment  1 application Nasal BID  . senna  1 tablet  Oral BID  . warfarin  5 mg Oral Once  . Warfarin - Pharmacist Dosing Inpatient   Does not apply q1800   Continuous Infusions:    Principal Problem:   Femur fracture, left Active Problems:   FIBRILLATION, ATRIAL   OSTEOPOROSIS   Chronic anticoagulation   Fracture of hip, left, closed   Normocytic anemia   Subtrochanteric fracture of left femur   Acute blood loss anemia   Thrombocytopenia, unspecified   Right hip pain   Time spent 15 minutes

## 2013-10-28 NOTE — Evaluation (Signed)
Occupational Therapy Evaluation Patient Details Name: Kim [name] MRN: [id number] DOB: December 21, 1917 Today's Date: 10/28/2013    History of Present Illness 80 year old woman with a mechanical fall resulting in left proximal femur fracture. Admitted for further evaluation. History of atrial fibrillation on warfarin, s/p intramedullary nailing done. recently c/o right hip pain too, X- rays to be negative.   Clinical Impression   Pt presenting to acute OT with above situation.  She currently has increased pain in her RLE.  Pt was independent in ADLs prior to admission, but currently is presenting at a max assist level for lower body dressing and bathing tasks.  She is also weak in BUE. Pt will benefit for further OT services to increase her BUE strength (especially shoulders) and improve ADL status s/p current left femur fracture.  Recommend SNF OT services. Pt is in agreement.    Follow Up Recommendations  SNF    Equipment Recommendations   (Defer to SNF)    Recommendations for Other Services       Precautions / Restrictions Precautions Precautions: Fall Restrictions LLE Weight Bearing: Partial weight bearing      Mobility Bed Mobility                  Transfers                      Balance                                            ADL Overall ADL's : Needs assistance/impaired Eating/Feeding: Independent   Grooming: Independent       Lower Body Bathing: Maximal assistance       Lower Body Dressing: Maximal assistance                 General ADL Comments: Due to pain and recent surgery pt is max assist for all LE ADL tasks     Vision                     Perception     Praxis      Pertinent Vitals/Pain Pt is 6/10 pain in RLE     Hand Dominance Right   Extremity/Trunk Assessment Upper Extremity Assessment Upper Extremity Assessment: Generalized weakness   Lower Extremity Assessment Lower Extremity  Assessment: Defer to PT evaluation       Communication Communication Communication: No difficulties   Cognition Arousal/Alertness: Awake/alert Behavior During Therapy: WFL for tasks assessed/performed Overall Cognitive Status: Within Functional Limits for tasks assessed                     General Comments       Exercises       Shoulder Instructions      Home Living Family/patient expects to be discharged to:: Skilled nursing facility Living Arrangements: Alone                                      Prior Functioning/Environment Level of Independence: Needs assistance  Gait / Transfers Assistance Needed: Ambulates with RW ADL's / Homemaking Assistance Needed: Pt was independent in all ADLS, but was receinv some assist with IADLs.  Pt has aide come to the house Tuesdays and Thursdays to assist with housecleaning  and driving for errands.        OT Diagnosis: Generalized weakness   OT Problem List: Decreased strength;Decreased coordination;Decreased knowledge of use of DME or AE;Pain   OT Treatment/Interventions: Self-care/ADL training;Therapeutic exercise;Energy conservation;DME and/or AE instruction;Therapeutic activities;Patient/family education    OT Goals(Current goals can be found in the care plan section) Acute Rehab OT Goals Patient Stated Goal: to get back to shopping and going to the beauty parlor like I had been OT Goal Formulation: With patient Time For Goal Achievement: 11/11/13 Potential to Achieve Goals: Good ADL Goals Pt Will Perform Lower Body Bathing: with min assist Pt Will Perform Lower Body Dressing: with min assist Pt Will Transfer to Toilet: with min assist Pt/caregiver will Perform Home Exercise Program: Increased strength;Both right and left upper extremity  OT Frequency: Min 2X/week   Barriers to D/C:            Co-evaluation              End of Session Nurse Communication:  (Notified RN of pt's increasing  RLE pain)  Activity Tolerance: Patient tolerated treatment well Patient left: in bed;with call bell/phone within reach   Time: [id number] OT Time Calculation (min): 15 min Charges:  OT General Charges $OT Visit: 1 Procedure OT Evaluation $Initial OT Evaluation Tier I: 1 Procedure G-Codes:     Marry GuanMarie Rawlings, MS, OTR/L [phone]  10/28/2013, 3:30 PM

## 2013-10-28 NOTE — Progress Notes (Signed)
Subjective: 3 Days Post-Op Procedure(s) (LRB): INTRAMEDULLARY (IM) NAIL FEMORAL WITH DALL MILES CABLE TO LEFT FEMUR (Left) She continues to complain of pain in the right hip and back and somewhat into the right leg radiating to the right knee. X-rays were negative. She has active flexion of the right hip and knee. Without groin pain. She still has tenderness in the lower back and over the right iliac crest.  Recommend CT scan lumbar spine to check for spinal stenosis or herniated disc  Increase Neurontin to 200 mg 3 times a day and repeat steroids IV.  Objective: Vital signs in last 24 hours: Temp:  [97.7 F (36.5 C)-98.4 F (36.9 C)] 98.1 F (36.7 C) (05/04 0530) Pulse Rate:  [102-112] 102 (05/04 0530) Resp:  [14-20] 16 (05/04 0530) BP: (98-116)/(58-75) 116/75 mmHg (05/04 0530) SpO2:  [93 %-98 %] 97 % (05/04 0530)  Intake/Output from previous day: 05/03 0701 - 05/04 0700 In: 480 [P.O.:480] Out: -  Intake/Output this shift:     Recent Labs  10/26/13 0549 10/26/13 1130 10/27/13 0503 10/28/13 0645  HGB 8.1* 8.0* 10.7* 10.5*    Recent Labs  10/27/13 0503 10/28/13 0645  WBC 9.3 8.3  RBC 3.41* 3.33*  HCT 31.4* 30.6*  PLT 132* 151    Recent Labs  10/26/13 0549 10/27/13 0503  NA 135* 133*  K 4.6 4.2  CL 103 100  CO2 26 26  BUN 26* 25*  CREATININE 0.75 0.68  GLUCOSE 115* 112*  CALCIUM 7.8* 8.0*    Recent Labs  10/27/13 0503 10/28/13 0645  INR 1.41 1.63*     Assessment/Plan: 3 Days Post-Op Procedure(s) (LRB): INTRAMEDULLARY (IM) NAIL FEMORAL WITH DALL MILES CABLE TO LEFT FEMUR (Left) CT scan lumbar spine.  Vickki HearingStanley E Harrison 10/28/2013, 7:07 AM

## 2013-10-28 NOTE — Progress Notes (Signed)
ANTICOAGULATION CONSULT NOTE  Pharmacy Consult for Coumadin Indication: atrial fibrillation, post-op VTE prophylaxis  Allergies  Allergen Reactions  . Nitrofurantoin Rash    Patient Measurements: Height: 5\' 4"  (162.6 cm) Weight: 128 lb 6.4 oz (58.242 kg) IBW/kg (Calculated) : 54.7  Vital Signs: Temp: 98.1 F (36.7 C) (05/04 0530) Temp src: Oral (05/04 0530) BP: 116/75 mmHg (05/04 0530) Pulse Rate: 102 (05/04 0530)  Labs:  Recent Labs  10/26/13 0549 10/26/13 1130 10/27/13 0503 10/28/13 0645  HGB 8.1* 8.0* 10.7* 10.5*  HCT 23.7* 22.9* 31.4* 30.6*  PLT 123* 118* 132* 151  LABPROT 17.8*  --  16.9* 18.9*  INR 1.51*  --  1.41 1.63*  CREATININE 0.75  --  0.68  --     Estimated Creatinine Clearance: 37.1 ml/min (by C-G formula based on Cr of 0.68).   Medical History: Past Medical History  Diagnosis Date  . Atrial fibrillation   . Osteoporosis     Knee; hip  . Wrist fracture 1988    Left  . Detached retina, right   . Chronic anticoagulation   . Compression fracture of thoracic spine, non-traumatic   . Coronary artery disease     Medications:  Prescriptions prior to admission  Medication Sig Dispense Refill  . acetaminophen (TYLENOL) 500 MG tablet Take 500 mg by mouth at bedtime. *takes one hour before bedtime for rest      . Ascorbic Acid (VITAMIN C) 500 MG tablet Take 500 mg by mouth daily.        . Calcium Carbonate (CALTRATE 600 PO) Take 1 capsule by mouth every morning.       . carvedilol (COREG) 3.125 MG tablet Take 3.125 mg by mouth 2 (two) times daily with a meal.        . fish oil-omega-3 fatty acids 1000 MG capsule Take 1 g by mouth every morning.       . fluticasone (VERAMYST) 27.5 MCG/SPRAY nasal spray Place 2 sprays into the nose at bedtime.      . Multiple Vitamins-Minerals (MULTIVITAMIN WITH MINERALS) tablet Take 1 tablet by mouth every morning.       . Potassium Bicarbonate 99 MG CAPS Take 1 capsule by mouth every morning.       . warfarin  (COUMADIN) 3 MG tablet Take 3-4.5 mg by mouth daily. Takes one and one-half tablet every day (4.5mg  total), except takes one tablet on Mondays and Fridays        Assessment: 80 yo F on chronic warfarin for Afib.  Home dose listed above.  She was admitted with left femur fracture and is s/p repair POD#3.   INR was sub-therapeutic on admission.  She received a total of Vitamin K 11mg  to further reverse INR for surgery.   Acute post-op anemia noted -Hg stable after transfusion.   Goal of Therapy:  INR 2-3   Plan:  Coumadin 5mg  po x1 today Daily INR  Mercy Ridingndrea Michelle Elnor Renovato 10/28/2013,8:09 AM

## 2013-10-28 NOTE — Progress Notes (Signed)
Physical Therapy Treatment Patient Details Name: Kim EldersMary M Knoop MRN: [id number] DOB: 12-Nov-1917 Today's Date: 10/28/2013    History of Present Illness      PT Comments    Pt limited by pain Rt LE s/p Lt hip fx.  Pt eager and willing to participate with therapy today.  A/AAROM bed exercises complete with minimal difficulty.  Max assistance with stand pivot transfer to chair Lt LE PWB and back to bed following information pt to receive CT scan shortly.  Pt left in bed with RN in room.  Reports pain reduced to 9/10 at end of session.      Follow Up Recommendations  SNF (per PT recommendations last session ) Rickard Kennerly PT DPT     Equipment Recommendations       Recommendations for Other Services       Precautions / Restrictions Precautions Precautions: Fall Restrictions Weight Bearing Restrictions: Yes LLE Weight Bearing: Partial weight bearing (per RN)    Mobility  Bed Mobility Overal bed mobility: Needs Assistance Bed Mobility: Supine to Sit;Sit to Supine     Supine to sit: Max assist Sit to supine: Max assist   General bed mobility comments: Max cueing for hand placement, decreased Lt LE WB in sitting secondary to pain.  Transfers Overall transfer level: Modified independent Equipment used: Rolling walker (2 wheeled)             General transfer comment: stand pivot transfer with Lt LE PWB  Ambulation/Gait                 Stairs            Wheelchair Mobility    Modified Rankin (Stroke Patients Only)       Balance                                    Cognition Arousal/Alertness: Awake/alert Behavior During Therapy: WFL for tasks assessed/performed Overall Cognitive Status: Within Functional Limits for tasks assessed                      Exercises Total Joint Exercises Ankle Circles/Pumps: Both;AROM;15 reps;Supine Quad Sets: AROM;Both;10 reps Gluteal Sets: AROM;Both;10 reps;Supine Heel Slides: AAROM;Left;10  reps;Supine Straight Leg Raises: AAROM;Left;10 reps;Supine    General Comments        Pertinent Vitals/Pain Rt LE pain scale 9/10 end of session.    Home Living                      Prior Function            PT Goals (current goals can now be found in the care plan section) Progress towards PT goals: Progressing toward goals    Frequency       PT Plan Current plan remains appropriate    Co-evaluation             End of Session Equipment Utilized During Treatment: Gait belt Activity Tolerance: Patient limited by pain Patient left: in bed;with nursing/sitter in room;with call bell/phone within reach (RN informed CT scan, requested pt left in bed)     Time: 2841-32440940-1022 PT Time Calculation (min): 42 min  Charges:  $Therapeutic Exercise: 8-22 mins $Therapeutic Activity: 23-37 mins                    G Codes:      Ruby Colaasey Jo  [place] 10/28/2013, 10:36 AM  Jerilee Fieldash Reyana Leisey PT, DPT

## 2013-10-28 NOTE — Progress Notes (Signed)
Physical Therapy Treatment Patient Details Name: Kim [name] MRN: [id number] DOB: 1918/02/01 Today's Date: 10/28/2013    History of Present Illness      PT Comments    Pt limited by pain Rt LE s/p Lt hip fx.  Pt eager and willing to participate with therapy today.  A/AAROM bed exercises complete with minimal difficulty.  Max assistance with stand pivot transfer to chair Lt LE PWB and back to bed following information pt to receive CT scan shortly.  Pt left in bed with RN in room.  Reports pain reduced to 9/10 at end of session.     Follow Up Recommendations  SNF (per PT recommendations last session )     Equipment Recommendations       Recommendations for Other Services       Precautions / Restrictions Precautions Precautions: Fall Restrictions Weight Bearing Restrictions: Yes LLE Weight Bearing: Partial weight bearing (per RN)    Mobility  Bed Mobility Overal bed mobility: Needs Assistance Bed Mobility: Supine to Sit;Sit to Supine     Supine to sit: Max assist Sit to supine: Max assist   General bed mobility comments: Max cueing for hand placement, decreased Lt LE WB in sitting secondary to pain.  Transfers Overall transfer level: Modified independent Equipment used: Rolling walker (2 wheeled)             General transfer comment: stand pivot transfer with Lt LE PWB  Ambulation/Gait                 Stairs            Wheelchair Mobility    Modified Rankin (Stroke Patients Only)       Balance                                    Cognition Arousal/Alertness: Awake/alert Behavior During Therapy: WFL for tasks assessed/performed Overall Cognitive Status: Within Functional Limits for tasks assessed                      Exercises Total Joint Exercises Ankle Circles/Pumps: Both;AROM;15 reps;Supine Quad Sets: AROM;Both;10 reps Gluteal Sets: AROM;Both;10 reps;Supine Heel Slides: AAROM;Left;10 reps;Supine Straight  Leg Raises: AAROM;Left;10 reps;Supine    General Comments        Pertinent Vitals/Pain Rt LE pain scale 9/10 end of session.    Home Living                      Prior Function            PT Goals (current goals can now be found in the care plan section) Progress towards PT goals: Progressing toward goals    Frequency       PT Plan Current plan remains appropriate    Co-evaluation             End of Session Equipment Utilized During Treatment: Gait belt Activity Tolerance: Patient limited by pain Patient left: in bed;with nursing/sitter in room;with call bell/phone within reach (RN informed CT scan, requested pt left in bed)     Time: 7253-66440940-1022 PT Time Calculation (min): 42 min  Charges:  $Therapeutic Exercise: 8-22 mins $Therapeutic Activity: 23-37 mins                    G Codes:      Juel BurrowCasey Jo Asiyah Pineau 10/28/2013, 10:36 AM

## 2013-10-29 ENCOUNTER — Inpatient Hospital Stay
Admission: RE | Admit: 2013-10-29 | Discharge: 2014-02-06 | Disposition: A | Discharge status: Home / Self Care | Payer: TRICARE For Life (TFL) | Source: Ambulatory Visit | Attending: Internal Medicine | Admitting: Internal Medicine

## 2013-10-29 ENCOUNTER — Other Ambulatory Visit: Payer: Self-pay | Admitting: *Deleted

## 2013-10-29 ENCOUNTER — Encounter (HOSPITAL_COMMUNITY): Payer: Self-pay | Admitting: Orthopedic Surgery

## 2013-10-29 DIAGNOSIS — I82409 Acute embolism and thrombosis of unspecified deep veins of unspecified lower extremity: Principal | ICD-10-CM

## 2013-10-29 DIAGNOSIS — S72002D Fracture of unspecified part of neck of left femur, subsequent encounter for closed fracture with routine healing: Secondary | ICD-10-CM

## 2013-10-29 DIAGNOSIS — S7292XD Unspecified fracture of left femur, subsequent encounter for closed fracture with routine healing: Secondary | ICD-10-CM

## 2013-10-29 LAB — PROTIME-INR
INR: 1.87 — AB (ref 0.00–1.49)
Prothrombin Time: 21 seconds — ABNORMAL HIGH (ref 11.6–15.2)

## 2013-10-29 MED ORDER — WARFARIN SODIUM 3 MG PO TABS: 3.0000 mg | ORAL_TABLET | Freq: Every day | ORAL | Status: DC

## 2013-10-29 MED ORDER — DSS 100 MG PO CAPS: 100.0000 mg | ORAL_CAPSULE | Freq: Two times a day (BID) | ORAL | Status: AC

## 2013-10-29 MED ORDER — ONDANSETRON HCL 4 MG PO TABS: 4.0000 mg | ORAL_TABLET | Freq: Four times a day (QID) | ORAL | Status: AC | PRN

## 2013-10-29 MED ORDER — SENNA 8.6 MG PO TABS: 1.0000 | ORAL_TABLET | Freq: Two times a day (BID) | ORAL | Status: DC

## 2013-10-29 MED ORDER — GABAPENTIN 100 MG PO CAPS: 200.0000 mg | ORAL_CAPSULE | Freq: Three times a day (TID) | ORAL | Status: DC

## 2013-10-29 MED ORDER — HYDROCODONE-ACETAMINOPHEN 5-325 MG PO TABS: 1.0000 | ORAL_TABLET | Freq: Four times a day (QID) | ORAL | Status: DC | PRN

## 2013-10-29 MED ORDER — METHOCARBAMOL 500 MG PO TABS: 500.0000 mg | ORAL_TABLET | Freq: Four times a day (QID) | ORAL | Status: DC

## 2013-10-29 NOTE — Discharge Summary (Signed)
Physician Discharge Summary  Kim [name] WUJ:[id number] DOB: 1917/08/22 DOA: 10/22/2013  PCP: [name], [name], MD  Admit date: 10/22/2013 Discharge date: 10/29/2013  Time spent: greater than 30 minutes  Recommendations for Outpatient Follow-up:  1. To SNF 2. Monitor INR within a week. Adjust coumadin if needed to keep INR between 2.0 and 3.0  Discharge Diagnoses:  Principal Problem:   Femur fracture, left Active Problems:   FIBRILLATION, ATRIAL   OSTEOPOROSIS   Chronic anticoagulation   Normocytic anemia   Subtrochanteric fracture of left femur   Acute blood loss anemia   Thrombocytopenia, unspecified   Right hip pain  Discharge Condition: stable  Filed Weights   10/22/13 1235 10/23/13 0423  Weight: 58.514 kg (129 lb) 58.242 kg (128 lb 6.4 oz)    History of present illness/Hospital Course: /:  80 year old woman with a mechanical fall resulting in left proximal femur fracture. Admitted for further evaluation. History of atrial fibrillation on warfarin.  1. Left proximal femur fracture status post mechanical fall. Status post surgery 5/1.  2. Acute blood loss anemia. Suspected to be related to fracture. Status post 6 units packed red blood cells. Hgb stable at 10 since 5/3. 3. Right hip, thigh pain. Resolving. X-rays negative. CT L spine shows nothing concerning. 4. Atrial fibrillation, maintained on warfarin as an outpatient. Rate controlled. Continue Coreg. Warfarin resumed. INR today 1.87 5. Thrombocytopenia, transient, resolved  Continue bowel regimen Bowel regimen.  Consultants:  Orthopedics: Romeo AppleHarrison Procedures:  4/29 Transfusion 2 units packed red blood cells  4/30 transfusion 2 units packed red blood cells  5/2 transfusion 2 units packed red blood cells  5/1 INTRAMEDULLARY (IM) NAIL FEMORAL WITH DALL MILES CABLE TO LEFT FEMUR (Left) - BIOMET   Discharge Exam: Filed Vitals:   10/29/13 0504  BP: 130/82  Pulse: 110  Temp: 98.2 F (36.8 C)  Resp: 19     General: eating breakfast. comfortable Cardiovascular: irreg irreg without MGR Respiratory: CTA without WRR Ext no tenderness either hip.  No edema  Discharge Orders   Future Orders Complete By Expires   Diet - low sodium heart healthy  As directed    Discharge instructions  As directed    Walk with assistance  As directed        Medication List         acetaminophen 500 MG tablet  Commonly known as:  TYLENOL  Take 500 mg by mouth at bedtime. *takes one hour before bedtime for rest     CALTRATE 600 PO  Take 1 capsule by mouth every morning.     carvedilol 3.125 MG tablet  Commonly known as:  COREG  Take 3.125 mg by mouth 2 (two) times daily with a meal.     DSS 100 MG Caps  Take 100 mg by mouth 2 (two) times daily.     fish oil-omega-3 fatty acids 1000 MG capsule  Take 1 g by mouth every morning.     fluticasone 27.5 MCG/SPRAY nasal spray  Commonly known as:  VERAMYST  Place 2 sprays into the nose at bedtime.     gabapentin 100 MG capsule  Commonly known as:  NEURONTIN  Take 2 capsules (200 mg total) by mouth 3 (three) times daily.     HYDROcodone-acetaminophen 5-325 MG per tablet  Commonly known as:  NORCO/VICODIN  Take 1-2 tablets by mouth every 6 (six) hours as needed for moderate pain.     methocarbamol 500 MG tablet  Commonly known as:  ROBAXIN  Take 1 tablet (500 mg total) by mouth 4 (four) times daily. For one week     multivitamin with minerals tablet  Take 1 tablet by mouth every morning.     ondansetron 4 MG tablet  Commonly known as:  ZOFRAN  Take 1 tablet (4 mg total) by mouth every 6 (six) hours as needed for nausea.     Potassium Bicarbonate 99 MG Caps  Take 1 capsule by mouth every morning.     senna 8.6 MG Tabs tablet  Commonly known as:  SENOKOT  Take 1 tablet (8.6 mg total) by mouth 2 (two) times daily.     vitamin C 500 MG tablet  Commonly known as:  ASCORBIC ACID  Take 500 mg by mouth daily.     warfarin 3 MG tablet   Commonly known as:  COUMADIN  Take 1-1.5 tablets (3-4.5 mg total) by mouth daily. Takes one and one-half tablet every day (4.5mg  total), except takes one tablet on Mondays and Fridays. Adjust to keep INR between 2.0 and 3.0.       Allergies  Allergen Reactions  . Nitrofurantoin Rash       Follow-up Information   Follow up with Fuller CanadaStanley Harrison, MD In 2 weeks.   Specialties:  Orthopedic Surgery, Radiology   Contact information:   [address], [address], [address] [place] [id number] [phone]       Please follow up. (SNF provider within one week)        The results of significant diagnostics from this hospitalization (including imaging, microbiology, ancillary and laboratory) are listed below for reference.    Significant Diagnostic Studies: Dg Chest 1 View  10/22/2013   CLINICAL DATA:  Pre operative respiratory exam. Proximal left femur fracture.  EXAM: CHEST - 1 VIEW  COMPARISON:  Chest x-ray dated 08/26/2003 and thoracic spine radiographs dated 09/26/2012  FINDINGS: Heart size and pulmonary vascularity are normal. Chronic accentuation of the interstitial markings with COPD. Compression fracture at T8 has markedly progressed since the radiographs of 09/26/2012. There is an old compression fracture of T12 treated with vertebroplasty.  There are no infiltrates or effusions.  IMPRESSION: Marked increase in the severity of the compression fracture of T8 since 09/26/2012. COPD.   Electronically Signed   By: Geanie CooleyJim  Maxwell M.D.   On: 10/22/2013 15:38   Dg Hip Operative Left  10/25/2013   CLINICAL DATA:  The left hip fracture.  EXAM: OPERATIVE LEFT HIP  COMPARISON:  10/22/2013  FINDINGS: Examination demonstrates placement of a intramedullary nail within the left femur bridging the trochanteric/ subtrochanteric fracture extending to the distal femoral metaphysis with distal anchoring screws intact. There are 2 associated screws proximally over the trochanteric  region with 1 extending through the neck into the femoral head as these are intact. There is near anatomic alignment about the subtrochanteric fracture. There is continued mild residual displacement of the lesser trochanter.  IMPRESSION: Near anatomic alignment about patient's sub trochanteric fracture with minimal residual displacement of the lesser trochanter post fixation with hardware intact.   Electronically Signed   By: Elberta Fortisaniel  Boyle M.D.   On: 10/25/2013 14:06   Dg Femur Left  10/22/2013   CLINICAL DATA:  Fall and left leg pain.  EXAM: LEFT FEMUR - 2 VIEW  COMPARISON:  None.  FINDINGS: Two views of the femur demonstrate an obliquely oriented, displaced fracture involving the proximal left femur. The fracture involves the subtrochanteric region but does extend through the lesser trochanter.  The lesser trochanter is displaced. Fracture does not clearly involve the greater trochanter. The left hip is located with mild degenerative disease. Patient has a left knee replacement which is located. No evidence for a periprosthetic fracture.  IMPRESSION: Obliquely oriented and displaced fracture of the proximal left femur. The fracture involves the lesser trochanter and subtrochanteric femur.   Electronically Signed   By: Richarda Overlie M.D.   On: 10/22/2013 15:35   Ct Head Wo Contrast  10/22/2013   CLINICAL DATA:  Fall, headache  EXAM: CT HEAD WITHOUT CONTRAST  TECHNIQUE: Contiguous axial images were obtained from the base of the skull through the vertex without intravenous contrast.  COMPARISON:  Most recent CT scan of the head and cervical spine 08/13/2013  FINDINGS: Negative for acute intracranial hemorrhage, acute infarction, mass, mass effect, hydrocephalus or midline shift. Gray-white differentiation is preserved throughout. Stable age related atrophy. Advanced chronic microvascular ischemic white matter change. Globes and orbits are intact bilaterally. No focal scalp hematoma or calvarial fracture. Partial  calcification of the right globe. Normal aeration of the bilateral mastoid air cells and visualized paranasal sinuses. Atherosclerotic calcification in the bilateral cavernous supra clinoid carotid arteries.  IMPRESSION: 1. No acute intracranial abnormality. 2. Similar appearance of the brain with global atrophy, chronic microvascular ischemic white matter disease and intracranial atherosclerosis.   Electronically Signed   By: Malachy Moan M.D.   On: 10/22/2013 14:45   Ct Lumbar Spine Wo Contrast  10/28/2013   CLINICAL DATA:  Fall 10/22/2013. Left hip fracture. A spinal stenosis.  EXAM: CT LUMBAR SPINE WITHOUT CONTRAST  TECHNIQUE: Multidetector CT imaging of the lumbar spine was performed without intravenous contrast administration. Multiplanar CT image reconstructions were also generated.  COMPARISON:  CT abdomen and pelvis 09/29/2012  FINDINGS: Spinal augmentation at T12 is stable. Remote superior endplate fractures of L2 and L3 are stable. Chronic Schmorl's nodes at L4-5 are unchanged. Grade 1 anterolisthesis at L3 is stable. S-shaped scoliosis of the lumbar spine is convex to the left at L4-5 and to the right at L2 without significant interval change.  Limited imaging of the abdomen demonstrates atherosclerotic calcifications within the aorta without aneurysm. Degenerative changes are again noted at the SI joints bilaterally.  T12-L1: Facet hypertrophy contributes to mild osseous foraminal narrowing bilaterally, left greater than right.  L1-2: Moderate facet hypertrophy is worse on the left. Mild to moderate left lateral recess and mild left foraminal narrowing is present.  L2-3: A broad-based disc herniation is present. Moderate facet hypertrophy is worse on the left. Moderate central canal stenosis is present. Facet and endplate spurring contribute to mild foraminal narrowing bilaterally, worse on the left.  L3-4: A broad-based disc herniation is present. Moderate facet hypertrophy is noted bilaterally.  Moderate central canal stenosis is evident with right greater than left lateral recess narrowing. Moderate foraminal stenosis is also worse on the right.  L4-5: A broad-based disc herniation is present. Mild lateral recess narrowing is worse on the right. Mild to moderate foraminal stenosis is worse on the right.  L5-S1: Moderate facet hypertrophy is evident bilaterally. A rightward disc protrusion is evident. There is no significant stenosis.  IMPRESSION: 1. Multilevel spondylosis of the cervical spine and scoliosis is similar to the prior study. 2. Remote superior endplate compression fractures at L2 and L3 are stable. 3. Remote spinal augmentation at T12 is stable. 4. Mild to moderate left lateral recess and mild left foraminal stenosis is present cell 1 2. 5. Moderate central canal stenosis and mild  foraminal narrowing bilaterally at L2-3 is worse on the left. 6. Moderate central and bilateral foraminal narrowing at L3-4 is worse on the right. 7. Mild lateral recess narrowing and mild to moderate foraminal stenosis at L4-5 is worse on the right. 8. Moderate facet hypertrophy at L5-S1 and a shallow rightward disc protrusion without significant stenosis.   Electronically Signed   By: Gennette Pac M.D.   On: 10/28/2013 11:19   Dg Pelvis Portable  10/26/2013   CLINICAL DATA:  Right hip pain.  EXAM: PORTABLE PELVIS 1-2 VIEWS  COMPARISON:  CT, 09/29/2012.  FINDINGS: No acute fracture or dislocation.  Recent left intertrochanteric hip fracture has been reduced with screws and an intra medullary rod. Orthopedic hardware is well-seated. Major fracture fragments are well aligned. This is new from the prior CT.  Hip joints are normally aligned. There are small osteophytes from the base of the right femoral head. No other arthropathic change.  Bones are diffusely demineralized. The soft tissues show femoral vascular calcifications. There is air in the soft tissues underneath surgical staples from the recent left hip  surgery.  IMPRESSION: 1. No new fracture or dislocation. 2. ORIF of a left intertrochanteric hip fracture, performed recently.   Electronically Signed   By: Amie Portland M.D.   On: 10/26/2013 14:07   Dg Femur Right Port  10/26/2013   CLINICAL DATA:  80 year old female with right hip pain.  EXAM: PORTABLE RIGHT FEMUR - 2 VIEW  COMPARISON:  09/29/2012 CT  FINDINGS: There is no evidence of acute fracture, subluxation or dislocation.  Mild to moderate degenerative changes within the right hip noted.  Severe degenerative changes in the right knee are noted with chondrocalcinosis/CPPD.  No focal bony lesions are present.  IMPRESSION: No evidence of acute bony abnormality.  Mild to moderate degenerative changes within the right hip and severe degenerative changes with right knee with chondrocalcinosis/ CPPD.   Electronically Signed   By: Laveda Abbe M.D.   On: 10/26/2013 14:18   Dg Knee Complete 4 Views Left  10/22/2013   CLINICAL DATA:  Severe left knee pain secondary to a fall this morning. Previous total knee prosthesis insertion.  EXAM: LEFT KNEE - COMPLETE 4+ VIEW  COMPARISON:  05/12/2011  FINDINGS: There is no fracture or dislocation or appreciable joint effusion. No loosening of the components of the knee prosthesis.  IMPRESSION: No acute abnormalities.   Electronically Signed   By: Geanie Cooley M.D.   On: 10/22/2013 14:09   EKG Atrial fibrillation Nonspecific T wave abnormality  Microbiology: Recent Results (from the past 240 hour(s))  SURGICAL PCR SCREEN     Status: Abnormal   Collection Time    10/22/13 10:50 PM      Result Value Ref Range Status   MRSA, PCR NEGATIVE  NEGATIVE Final   Staphylococcus aureus POSITIVE (*) NEGATIVE Final   Comment:            The Xpert SA Assay (FDA     approved for NASAL specimens     in patients over 47 years of age),     is one component of     a comprehensive surveillance     program.  Test performance has     been validated by [employer] for  patients greater     than or equal to 65 year old.     It is not intended     to diagnose infection nor to  guide or monitor treatment.     RESULT CALLED TO, READ BACK BY AND VERIFIED WITH:     HEARN J AT 0114 ON [id number] BY FORSYTH K     Labs: Basic Metabolic Panel:  Recent Labs Lab 10/23/13 0546 10/24/13 0615 10/25/13 0613 10/26/13 0549 10/27/13 0503  NA 136* 136* 135* 135* 133*  K 5.3 4.5 4.1 4.6 4.2  CL 99 102 101 103 100  CO2 26 27 24 26 26   GLUCOSE 122* 122* 118* 115* 112*  BUN 30* 39* 32* 26* 25*  CREATININE 1.15* 1.10 0.70 0.75 0.68  CALCIUM 8.9 8.2* 8.5 7.8* 8.0*   Liver Function Tests:  Recent Labs Lab 10/22/13 1422 10/23/13 0546  AST 26 23  ALT 13 12  ALKPHOS 63 47  BILITOT 0.8 1.3*  PROT 6.8 6.0  ALBUMIN 3.6 3.1*   No results found for this basename: LIPASE, AMYLASE,  in the last 168 hours No results found for this basename: AMMONIA,  in the last 168 hours CBC:  Recent Labs Lab 10/22/13 1422  10/25/13 0613 10/26/13 0549 10/26/13 1130 10/27/13 0503 10/28/13 0645  WBC 8.4  < > 11.0* 7.8 8.3 9.3 8.3  NEUTROABS 6.2  --  8.4*  --   --   --   --   HGB 11.1*  < > 12.0 8.1* 8.0* 10.7* 10.5*  HCT 32.8*  < > 34.8* 23.7* 22.9* 31.4* 30.6*  MCV 97.0  < > 92.6 94.0 94.6 92.1 91.9  PLT 249  < > 128* 123* 118* 132* 151  < > = values in this interval not displayed. Cardiac Enzymes: No results found for this basename: CKTOTAL, CKMB, CKMBINDEX, TROPONINI,  in the last 168 hours BNP: BNP (last 3 results) No results found for this basename: PROBNP,  in the last 8760 hours CBG: No results found for this basename: GLUCAP,  in the last 168 hours   Signed:  Christiane Ha  [name] 10/29/2013, 9:07 AM

## 2013-10-29 NOTE — Clinical Social Work Note (Signed)
Patient ready for discharge today, [facility] tunnel w [facility] staff. Patient agreeable.  Son notifed by phone, will contact SNF to complete preadmission paperwork.  Kerri notifed by VM.  FL2 reviewed w RN and updated as necessary, discharge summary faxed to facility, discharge packet prepared and placed w shadow chart for transport.  CSW signing off as no further SW needs identified.  Santa GeneraAnne Cunningham, LCSW Clinical Social Worker [phone])

## 2013-10-29 NOTE — Telephone Encounter (Signed)
[facility] 

## 2013-10-29 NOTE — Progress Notes (Signed)
Nutrition Brief Note  Patient identified due to length of stay.  Wt Readings from Last 15 Encounters:  10/23/13 128 lb 6.4 oz (58.242 kg)  10/23/13 128 lb 6.4 oz (58.242 kg)  08/13/13 129 lb (58.514 kg)  09/29/12 129 lb (58.514 kg)  02/01/12 134 lb (60.782 kg)  11/24/11 134 lb (60.782 kg)  11/17/10 134 lb (60.782 kg)  10/14/10 131 lb (59.421 kg)  11/13/08 140 lb (63.504 kg)  07/10/08 137 lb 4 oz (62.256 kg)  03/07/08 135 lb 8 oz (61.462 kg)  01/17/08 134 lb (60.782 kg)  11/22/07 130 lb (58.968 kg)  09/12/07 138 lb (62.596 kg)  08/03/07 135 lb (61.236 kg)    Body mass index is 22.03 kg/(m^2). Patient meets criteria for normal based on current BMI. Weight hx shows stable weight and pt reports good appetite today.  Current diet order is Heart healthy, patient is consuming approximately ~50% of meals at this time. Labs and medications reviewed.   No nutrition interventions warranted at this time. [facility] is being [facility] will continue to follow her there and assess her nutrition needs.

## 2013-10-29 NOTE — Progress Notes (Signed)
Discharged to [facility] in stable condition, out via bed with staff.

## 2013-10-30 ENCOUNTER — Non-Acute Institutional Stay (SKILLED_NURSING_FACILITY): Payer: Medicare Other | Admitting: Internal Medicine

## 2013-10-30 ENCOUNTER — Telehealth: Payer: Self-pay | Admitting: Orthopedic Surgery

## 2013-10-30 DIAGNOSIS — S7290XA Unspecified fracture of unspecified femur, initial encounter for closed fracture: Secondary | ICD-10-CM

## 2013-10-30 DIAGNOSIS — L89609 Pressure ulcer of unspecified heel, unspecified stage: Secondary | ICD-10-CM | POA: Insufficient documentation

## 2013-10-30 DIAGNOSIS — I4891 Unspecified atrial fibrillation: Secondary | ICD-10-CM

## 2013-10-30 DIAGNOSIS — S7292XA Unspecified fracture of left femur, initial encounter for closed fracture: Secondary | ICD-10-CM

## 2013-10-30 DIAGNOSIS — R29898 Other symptoms and signs involving the musculoskeletal system: Secondary | ICD-10-CM | POA: Insufficient documentation

## 2013-10-30 NOTE — Telephone Encounter (Signed)
Routing to Dr Harrison 

## 2013-10-30 NOTE — Telephone Encounter (Signed)
Call/Question from [facility] physical therapy; spoke with Kathie RhodesBetty, asking for weight bearing status. Patient has been scheduled for 2 week hospital follow up appointment, status post femoral nail hip surgery 10/25/13.   Please advise.  Ph# [phone]

## 2013-10-30 NOTE — Progress Notes (Signed)
Patient ID: Kim EldersMary M [name], female   DOB: 1918/05/24, 80 y.o.   MRN: [id number] Facility; [facility] Chief complaint admission to SNF post stay at [facility] 4/28 through 5/4 History; this is a functionally independent 80 year old woman who [place] at [place] on her own. Still drives still does her own grocery shopping etc. She tells me that her left leg simply gave out on her and she fell suffering a left femur fracture. She underwent a left intramedullary nail fixation. As far as I can see the postoperative complications included anemia and she was transfused a total of 6 units of blood. She does have a history of chronic anticoagulation with Coumadin due to atrial fibrillation. He would appear that she also had thrombocytopenia. Her platelet count today was 250,000 which is normal. Her hemoglobin is 10.4. Acing metabolic panel is normal except for a BUN of 35. She also had a CT scan of the head that showed no intracranial abnormality. A CT scan of the lumbar spine showed multilevel spondylosis of the lumbar spine moderate central canal stenosis and mild foraminal not narrowing worse at L2-L3 on the left appear she had moderate central and bilateral foraminal not narrowing at L3-L4 worse on the right and mild to moderate foraminal stenosis at L4-L5 worse on the right.  As far as the patient is concerned her major complaints is that of episodic right thigh for right hip pain. She says this comes in spasms. I note that she had x-rays of the right hip right pelvis and right femur that did not disclose a fracture. As mentioned previously this is a functionally independent lady at home used a cane. She tells me she had one prior fall this year. She does have a history of osteoporosis  Past Medical History  Diagnosis Date  . Atrial fibrillation   . Osteoporosis     Knee; hip  . Wrist fracture 1988    Left  . Detached retina, right   . Chronic anticoagulation   . Compression fracture of  thoracic spine, non-traumatic   . Coronary artery disease    Past Surgical History  Procedure Laterality Date  . Sp kyphoplasty      Percutaneous  . Retinal detachment surgery    . Cataract extraction      Bilateral  . Total knee arthroplasty  2006    Left  . Breast excisional biopsy  1954; 1968    Benign disease  . Orif wrist fracture  1988  . Colonoscopy  2000  . Back surgery    . Femur im nail Left 10/25/2013    Procedure: INTRAMEDULLARY (IM) NAIL FEMORAL WITH DALL MILES CABLE TO LEFT FEMUR;  Surgeon: Vickki HearingStanley E Harrison, MD;  Location: [facility];  Service: Orthopedics;  Laterality: Left;  BIOMET    Current Outpatient Prescriptions on File Prior to Visit  Medication Sig Dispense Refill  . acetaminophen (TYLENOL) 500 MG tablet Take 500 mg by mouth at bedtime. *takes one hour before bedtime for rest      . Ascorbic Acid (VITAMIN C) 500 MG tablet Take 500 mg by mouth daily.        . Calcium Carbonate (CALTRATE 600 PO) Take 1 capsule by mouth every morning.       . carvedilol (COREG) 3.125 MG tablet Take 3.125 mg by mouth 2 (two) times daily with a meal.        . docusate sodium 100 MG CAPS Take 100 mg by mouth 2 (two) times  daily.  10 capsule  0  . fish oil-omega-3 fatty acids 1000 MG capsule Take 1 g by mouth every morning.       . fluticasone (VERAMYST) 27.5 MCG/SPRAY nasal spray Place 2 sprays into the nose at bedtime.      . gabapentin (NEURONTIN) 100 MG capsule Take 2 capsules (200 mg total) by mouth 3 (three) times daily.      Marland Kitchen. HYDROcodone-acetaminophen (NORCO/VICODIN) 5-325 MG per tablet Take 1-2 tablets by mouth every 6 (six) hours as needed for moderate pain.  240 tablet  0  . methocarbamol (ROBAXIN) 500 MG tablet Take 1 tablet (500 mg total) by mouth 4 (four) times daily. For one week  28 tablet  0  . Multiple Vitamins-Minerals (MULTIVITAMIN WITH MINERALS) tablet Take 1 tablet by mouth every morning.       . ondansetron (ZOFRAN) 4 MG tablet Take 1 tablet (4 mg total) by mouth  every 6 (six) hours as needed for nausea.  20 tablet  0  . Potassium Bicarbonate 99 MG CAPS Take 1 capsule by mouth every morning.       . senna (SENOKOT) 8.6 MG TABS tablet Take 1 tablet (8.6 mg total) by mouth 2 (two) times daily.  120 each  0  . warfarin (COUMADIN) 3 MG tablet Take 1-1.5 tablets (3-4.5 mg total) by mouth daily. Takes one and one-half tablet every day (4.5mg  total), except takes one tablet on Mondays and Fridays. Adjust to keep INR between 2.0 and 3.0.       No current facility-administered medications on file prior to visit.    Social; patient states she lives in her own home with her dog. She has 2 children who live in the general area. She was independent with ADLs IADLs still driving. Use the cane as mentioned she was on Coumadin. History   Social History  . Marital Status: Widowed    Spouse Name: N/A    Number of Children: N/A  . Years of Education: 12   Occupational History  . retail sales     retired  .     Social History Main Topics  . Smoking status: Never Smoker   . Smokeless tobacco: Never Used  . Alcohol Use: No  . Drug Use: No  . Sexual Activity: No   Other Topics Concern  . Not on file   Social History Narrative  . No narrative on file   family history includes Arthritis in an other family member; Heart attack in her mother; Heart disease in her father.  Review of systems Musculoskeletal; complains of pain in the right thigh/right hip GU no clear dysuria although her bladder seemed full to me GI had a bowel movement yesterday Neurologic; does not complain of a sensory loss in her legs  Physical exam Gen. alert woman conversational no evidence of dementia or delirium Vitals O2 sat 97% pulse around 100 atrial fibrillation respirations 16 and unlabored Respiratory; clear entry bilaterally no crackles or wheezes work of breathing is normal Cardiac; heart sounds are regular no murmurs she appears to be euvolemic Abdomen; distended no liver  spleen or masses are noted no tenderness GU; bladder fullness although she needed avoid this we'll probably need to be checked Musculoskeletal; incisions are clean although still sutured they're draining somewhat the. There is massive edema of the left leg right from the inguinal area on down although I think this is just blood loss rather than DVT this will definitely need to be ruled out.  Current INR is 2.17 Neurologic; there is really significant weakness in her right hip flexors and abductors. Possibly some wasting of the carotids on the right tip. Moving the right leg causes her to wince in pain. She could not lift the leg off the bed suggesting that in hip flexion she does not have antigravity strength. 3+ to 4/5 hip abduction. Reflexes; she has had a previous left total knee replacement there are no reflexes at the knee jerks on either side. Her left plantar is upgoing right is equivocal to up going. She was diffusely hyperreflexic in her arms there is no Hoffman's reflex Mental status; I see no major abnormalities here Skin; stage I pressure area to the right heel this may already have a small area of deep tissue injury however there is no in the epidermis  Impression/plan #1 status post left femur fracture in a previously functional independent woman. 2 no doubt they will nonweightbearing for a prolonged period. She is on Coumadin chronically #2 swelling of the left leg which I think is mostly blood loss she had 6 units of transfusion. Her INR is therapeutic. Nevertheless for sake of completeness I think a duplex ultrasound will be necessary #3 right leg weakness and pain. There was no overt fracture [see x-ray below] I must say I don't see an overt explanation for this. She has known spinal stenosis however in the patient describes her weakness and pain in the right leg as being a post fracture phenomena #4 stage I pressure area to the right heel with possible underlying deep tissue injury and  a small area #5 atrial fibrillation on chronic Coumadin she is a bit tachycardic. On a small dose of Corag #6 on potassium bicarbonate for reasons that are not clear

## 2013-10-31 ENCOUNTER — Ambulatory Visit (HOSPITAL_COMMUNITY)
Admit: 2013-10-31 | Discharge: 2013-10-31 | Disposition: A | Discharge status: Home / Self Care | Payer: Medicare Other | Attending: Internal Medicine | Admitting: Internal Medicine

## 2013-10-31 DIAGNOSIS — M79609 Pain in unspecified limb: Secondary | ICD-10-CM | POA: Insufficient documentation

## 2013-10-31 DIAGNOSIS — M7989 Other specified soft tissue disorders: Secondary | ICD-10-CM | POA: Insufficient documentation

## 2013-10-31 NOTE — Telephone Encounter (Signed)
Partial weight bearing

## 2013-10-31 NOTE — Telephone Encounter (Signed)
Kathie RhodesBetty was advised by Jacklynn GanongBrenda Freeman of Dr. Mort SawyersHarrison's reply.

## 2013-11-06 ENCOUNTER — Non-Acute Institutional Stay (SKILLED_NURSING_FACILITY): Payer: Medicare Other | Admitting: Internal Medicine

## 2013-11-06 DIAGNOSIS — S7290XA Unspecified fracture of unspecified femur, initial encounter for closed fracture: Secondary | ICD-10-CM

## 2013-11-06 DIAGNOSIS — L89609 Pressure ulcer of unspecified heel, unspecified stage: Secondary | ICD-10-CM

## 2013-11-06 DIAGNOSIS — S7292XA Unspecified fracture of left femur, initial encounter for closed fracture: Secondary | ICD-10-CM

## 2013-11-06 DIAGNOSIS — I4891 Unspecified atrial fibrillation: Secondary | ICD-10-CM

## 2013-11-11 NOTE — Progress Notes (Addendum)
Patient ID: Kim [name], female   DOB: [birth year], 80 y.o.   MRN: [id number]                  PROGRESS NOTE  DATE:  11/06/2013    FACILITY: [facility]    LEVEL OF CARE:   SNF   Acute Visit   CHIEF COMPLAINT:  Review of left hip operative site.    HISTORY OF PRESENT ILLNESS:  This is a 80 year-old woman who suffered a fall resulting in a left proximal femur fracture.  She underwent operative repair on May 1st.    She is status post 6 U of packed red cell transfusions.  Her hemoglobin was 10 on 10/27/2013.     During the hospitalization and then after her arrival here, she complained of right hip and thigh pain.  X-rays were negative.  A CT scan of her L-spine showed nothing concerning.    She has developed a right heel ulcer.  We have been using DuoDerm to this.    There has also been concern of some erythema surrounding the left hip incisions and I have been asked to look at this.    PHYSICAL EXAMINATION:   SKIN:  INSPECTION:  Left heel:  There is a well granulated but quarter-sized open area on the left heel.  This is going to need a collagen based dressing and more aggressive off-loading.    Left hip:  The incision is extensive.  There is still bruising.  However, I see no evidence of infection here.  Right hip:  Once again, she seems to complain of pain here.  However, she has good external and internal rotation of the right hip.  I am doubtful that there is anything specific here.    ASSESSMENT/PLAN:  Left hip incision.  I do not see any evidence of infection, but I think the staff should continue to be vigilant.     Stage II pressure ulcer over her right heel.  I do not see a major vascular issue here.  I will change her dressing to collagen with hydrogel from Kerlix/Coban, which can be changed three times a week.    Right thigh/right hip pain.  Again, she seems to have difficulty in this area although when you isolate the hip, it does not seem to be in  this area.  I was a bit perplexed when I first saw her and I still remain perplexed about this.  I note the normal x-rays that were done.  I would like to know how she does in therapy on the right hip.    Postoperative anemia.  She required 6 U of transfusion.  Her last hemoglobin on 11/04/2013 was 11.3.  This is stable to improved.    Atrial fibrillation.  Maintained on Coumadin.  We will continue to monitor this carefully.

## 2013-11-12 ENCOUNTER — Ambulatory Visit (INDEPENDENT_AMBULATORY_CARE_PROVIDER_SITE_OTHER): Payer: Self-pay | Admitting: Orthopedic Surgery

## 2013-11-12 VITALS — BP 101/58 | Ht 64.0 in | Wt 128.0 lb

## 2013-11-12 DIAGNOSIS — Z8781 Personal history of (healed) traumatic fracture: Secondary | ICD-10-CM

## 2013-11-12 DIAGNOSIS — IMO0002 Reserved for concepts with insufficient information to code with codable children: Secondary | ICD-10-CM

## 2013-11-12 NOTE — Progress Notes (Signed)
Patient ID: Kim [name], female   DOB: 07-11-1917, 80 y.o.   MRN: [id number]  Chief Complaint  Patient presents with  . Wound Check    2 week hospital follow up left hip IM femoral nail DOS 10/25/13    BP 101/58  Ht 5\' 4"  (1.626 m)  Wt 128 lb (58.06 kg)  BMI 21.96 kg/m2  Status post subtrochanteric femur fracture with intramedullary nailing  She developed a right heel ulcer.  She's  still having significant right hip and leg pain related to spinal stenosis.  Left hip seems to be doing well  Recommend epidural series at L4-5  X-ray prior to next visit  Partial weightbearing for up to 6 weeks depending on next x-ray.

## 2013-11-21 ENCOUNTER — Telehealth: Payer: Self-pay | Admitting: Orthopedic Surgery

## 2013-11-21 NOTE — Telephone Encounter (Signed)
Received call from Clydie Braun, [facility], inquiring about "moving up" the appointment for the epidural steroid injection for this patient; states she is in quite a bit of pain, which is making it difficult for her to do the therapy for her hip.  Please call back at [phone].

## 2013-11-22 NOTE — Telephone Encounter (Signed)
Returned call, however Clydie Braun was not in, so I spoke with the nurse, Tabitha. I advised her that the order for the [facility] was faxed to [facility] on 11/14/13. I gave her their number to call them and inquire about scheduling.

## 2013-11-26 ENCOUNTER — Encounter: Payer: Self-pay | Admitting: Internal Medicine

## 2013-11-26 ENCOUNTER — Non-Acute Institutional Stay (SKILLED_NURSING_FACILITY): Payer: Medicare Other | Admitting: Internal Medicine

## 2013-11-26 DIAGNOSIS — D696 Thrombocytopenia, unspecified: Secondary | ICD-10-CM

## 2013-11-26 DIAGNOSIS — I4891 Unspecified atrial fibrillation: Secondary | ICD-10-CM

## 2013-11-26 DIAGNOSIS — M810 Age-related osteoporosis without current pathological fracture: Secondary | ICD-10-CM

## 2013-11-26 DIAGNOSIS — S72009A Fracture of unspecified part of neck of unspecified femur, initial encounter for closed fracture: Secondary | ICD-10-CM

## 2013-11-26 DIAGNOSIS — IMO0002 Reserved for concepts with insufficient information to code with codable children: Secondary | ICD-10-CM

## 2013-11-26 DIAGNOSIS — Z7901 Long term (current) use of anticoagulants: Secondary | ICD-10-CM

## 2013-11-26 DIAGNOSIS — D649 Anemia, unspecified: Secondary | ICD-10-CM

## 2013-11-26 DIAGNOSIS — S72002A Fracture of unspecified part of neck of left femur, initial encounter for closed fracture: Secondary | ICD-10-CM

## 2013-11-26 NOTE — Progress Notes (Signed)
Patient ID: Kim [name], female   DOB: May 25, 1918, 80 y.o.   MRN: [id number]    an aThiscute-routine visit.  Level of care skills.  Facility [facility].  Chief complaint-acute visit followup left femur fracture osteoporosis atrial fibrillation.  Coronary artery disease--acute visit secondary to erythema around left eye.  History of present illness.  Patient is a very pleasant 80 year old female apparently had been fairly independent but sustained a fall with a left femur fracture-she underwent a left intramedullary nail fixation-she had postop anemia and required transfusion of 6 units.  She also had thrombocytopenia--although most recent platelet count actually is over 400,000 we will need to recheck this.  She has been followed by orthopedics--with recent orders for partial weightbearing with followup x-rays.  She continues with physical therapy.  Today her main complaint is some left eye irritation. She does have an erythematous upper eyelid that she's denies any acute visual changes hair.  In regards to other issues these appear to be stable.  I do note she does have a history of atrial fibrillation and is on Coreg-she had been on Coumadin however apparently late last month her Lovenox and Coumadin was DC'd per orthopedic recommendation-however per review of chart today it appears she does need to be on anticoagulation.  I did discuss this with her son via phone as well as with patient-also discussed this with Dr. Leanord Hawkingobson via phone.  Will start ASA 325 mg a day--I also left a message with her primary care provider Dr. Marney DoctorHall--and will await his input on this as well.  For now patient is in agreement with starting the aspirin.  Apparently she did have some left leg edema on admission this appears to have largely resolved she did have a venous Doppler that was negative for DVT.  Patient also apparently has spinal stenosis and I see most recent orthopedic note suggested possibly an  epidural series.  Family medical social history has been reviewed per admission note on 10/30/2013.  Medications have been reviewed per MAR.  Review of systems.  Gen. no complaints of fever or chills.  Skin does not complaining of rash or itching.  Eyes does complain of left eye irritation as noted above with some erythema of the eyelid--in fact when I reassessed her eyes she said her right eye also was feeling a bit irritated and complained of some exudate at times.  Respiratory no complaints of shortness of breath or cough.  Cardiac no chest pain.  GI does not complaining of nausea vomiting diarrhea constipation or abdominal discomfort.  Muscle skeletal does not complaining of joint pain apparently Vicodin is helping her joint pain as well as the back pain.  Neurologic does not complaining of headache dizziness numbness or syncopal type episode with holds.  Psych is not complaining of any depression or anxiety.  Physical exam.  T-. 97.0 pulse 82 respirations 18 blood pressure 103/65--weight is 133.  In general this is a very pleasant elderly female in no distress sitting comfortably in her wheelchair.  Her skin is warm and dry.-- does have dressing applied to her lower right heel  Eyes left eye does have an erythematous somewhat edematous upper eyelid possibly a small amount of surrounding erythema  suborbital although this is questionable-slightly erythematous conjunctiva I do not see any active drainage--the patient does report some matting.  On reassessment appeared right eye also appeared to be somewhat irritated. Appearing conjunctiva   Oropharynx is clear mucous membranes moist.  Chest is clear to auscultation with  no labored breathing.  Heart is regular irregular rate and rhythm without murmur gallop or rub she has trace lower extremity edema bilaterally  Abdomen is soft somewhat protuberant nontender with positive bowel sounds.  Muscle skeletal-. Does move  extremities x4 limited exam since patient is in bed--has fairly minimal edema bilaterally this apparently significantly improved--I do not note any deformities other than arthritic.  Neurologic is grossly intact no lateralizing findings--speech is clear.  Psych she is alert and oriented pleasant and appropriate.  Labs.  11/04/2013.  WBC 8.7 hemoglobin 11.3 platelets 444.  Sodium 135 potassium 4.4 BUN 26 creatinine 0.72  Assessment and plan.  #1 history of left hip fracture-this is followed by orthopedics she is working with therapy-continues on calcium supplementation-been receiving Vicodin apparently with relief for pain.  #2-history of what appears to be a stye conjunctivitis-possibly some orbital cellulitis although this appears to be quite minimal-Will treat with Cipro ophthalmologic solution 0.3% twice a day for 7 days 2 drops.  Also warm compresses twice a day for 7 days-to the left eye stye.  Also will start Keflex 500 mg twice a day for 7 days  #3-history of atrial fibrillation-as stated above suspect patient will need chronic anticoagulation-per fairly extensive discussion with her she had been on aspirin at one point and said it was switched to Coumadin a long time ago-I did discuss benefits risks of aspirin versus Coumadin and that aspirin has about one half the effectiveness of preventing clots but did not require monitoring and possibly less bleeding risk with ASA in case of a fall--she is comfortable with the aspirin for now-I have again left a message with Dr. Scharlene Gloss office to get his input on this as well I also discussed this with Dr. Leanord Hawking via phone concerning the plan of care--- She is on Coreg this appears to be rate controlled.  I do note a history of thrombocytopenia although platelets were actually elevated most recent lab will need to recheck this and monitor this closely.  #4-history of spinal stenosis  Herniated disc-per orthopedic consult recommended possibly  epidural injections will write order to follow on progress of this she is not complaining of back pain tonigh butt apparently does have a significant history here.--She does continue on Neurontin as well as the Vicodin.  Her 5-history of pressure ulcer right heel-this is followed by wound care and apparently is stable currently covered with dressing-this area will need to be protected.  #6 status history of postop anemia apparently this improved with the transfusion again will update CBC.  History osteoporosis status again she is on calcium supplementation.  #8-question history of hypokalemia --initially she was on potassium apparently this has been discontinued will update a metabolic panel to ensure stability here  [id number]-of note greater than 45 minutes spent assessing patient-discussing her status with nursing staff as well as with her son-and coordinating plan of care for numerous diagnoses-of note greater than 50% of time spent coordinating plan of care  .     --  .

## 2013-12-02 ENCOUNTER — Inpatient Hospital Stay
Admit: 2013-12-02 | Discharge: 2013-12-02 | Disposition: A | Discharge status: Home / Self Care | Payer: Medicare Other | Attending: Orthopedic Surgery | Admitting: Orthopedic Surgery

## 2013-12-02 VITALS — BP 114/54 | HR 96

## 2013-12-02 DIAGNOSIS — IMO0002 Reserved for concepts with insufficient information to code with codable children: Secondary | ICD-10-CM

## 2013-12-02 MED ORDER — IOHEXOL 180 MG/ML  SOLN
1.0000 mL | Freq: Once | INTRAMUSCULAR | Status: AC | PRN
  Administered 2013-12-02: 1 mL via EPIDURAL

## 2013-12-02 MED ORDER — METHYLPREDNISOLONE ACETATE 40 MG/ML INJ SUSP (RADIOLOG
120.0000 mg | Freq: Once | INTRAMUSCULAR | Status: AC
  Administered 2013-12-02: 120 mg via EPIDURAL

## 2013-12-02 NOTE — Discharge Instructions (Addendum)

## 2013-12-23 ENCOUNTER — Ambulatory Visit (HOSPITAL_COMMUNITY)
Admit: 2013-12-23 | Discharge: 2013-12-23 | Disposition: A | Discharge status: Home / Self Care | Payer: Medicare Other | Source: Ambulatory Visit | Attending: Orthopedic Surgery | Admitting: Orthopedic Surgery

## 2013-12-23 ENCOUNTER — Non-Acute Institutional Stay (SKILLED_NURSING_FACILITY): Payer: Medicare Other | Admitting: Internal Medicine

## 2013-12-23 DIAGNOSIS — Z8781 Personal history of (healed) traumatic fracture: Secondary | ICD-10-CM

## 2013-12-23 DIAGNOSIS — H109 Unspecified conjunctivitis: Secondary | ICD-10-CM

## 2013-12-23 DIAGNOSIS — Z4789 Encounter for other orthopedic aftercare: Secondary | ICD-10-CM | POA: Insufficient documentation

## 2013-12-24 ENCOUNTER — Ambulatory Visit (INDEPENDENT_AMBULATORY_CARE_PROVIDER_SITE_OTHER): Payer: Medicare Other | Admitting: Orthopedic Surgery

## 2013-12-24 VITALS — BP 115/69 | Ht 64.0 in | Wt 128.0 lb

## 2013-12-24 DIAGNOSIS — Z8781 Personal history of (healed) traumatic fracture: Secondary | ICD-10-CM

## 2013-12-24 NOTE — Progress Notes (Signed)
Patient ID: Kim [name], female   DOB: [birth year], 80 y.o.   MRN: [id number]   Chief Complaint  Patient presents with  . Follow-up    5 week recheck left femur/hip    Surgery 10/25/2013 left femur subtrochanteric fracture  Doing well including the pain she was having in her lumbar spine which responded well to epidural injection  No pain in the left hip  Good hip motion  Leg lengths and rotational alignment clinically looks normal. X-ray shows fracture stability and implants/hardware position is excellent  Recommend protected weightbearing with a walker for 6 weeks and then repeat the x-rays

## 2013-12-25 NOTE — Progress Notes (Signed)
Patient ID: Kim EldersMary M [name], female   DOB: 02-13-1918, 80 y.o.   MRN: [id number]                  PROGRESS NOTE  DATE:  12/23/2013    FACILITY: [facility]    LEVEL OF CARE:   SNF   Acute Visit   CHIEF COMPLAINT:  Right eye irritation and pain.    HISTORY OF PRESENT ILLNESS:  This is a 80 year-old woman who came to us after suffering a left femur fracture.  She has been rehabilitating here.    Over the last several days, she has developed pain and redness in her right eye and I was asked to see her because of this.  She does not have a history of glaucoma.  She does use lubricating eyedrops p.r.n. when she is at home.    PHYSICAL EXAMINATION:   HEENT:   EYES:  Right eye:  There is indeed intense erythema here with some drainage.  Her pupil reaction looks normal, although I think that she has had cataract surgery here.  Her visual acuity is intact.    ASSESSMENT/PLAN:  Intense conjunctivitis.  I am going to give her a mixture of an antibiotic and steroid ophthalmic.  If this is not better in two days, she will need to see Ophthalmology.  I have asked her not to rub at her eye.  There is already some periorbital bruising which I think is simply from mechanical trauma.    CPT CODE: [id number]

## 2014-01-08 ENCOUNTER — Other Ambulatory Visit: Payer: Self-pay | Admitting: *Deleted

## 2014-01-08 MED ORDER — HYDROCODONE-ACETAMINOPHEN 5-325 MG PO TABS: 1.0000 | ORAL_TABLET | Freq: Four times a day (QID) | ORAL | Status: DC | PRN

## 2014-01-08 NOTE — Telephone Encounter (Signed)
[facility] 

## 2014-01-29 ENCOUNTER — Non-Acute Institutional Stay (SKILLED_NURSING_FACILITY): Payer: Medicare Other | Admitting: Internal Medicine

## 2014-01-29 DIAGNOSIS — G609 Hereditary and idiopathic neuropathy, unspecified: Secondary | ICD-10-CM

## 2014-01-29 DIAGNOSIS — I482 Chronic atrial fibrillation, unspecified: Secondary | ICD-10-CM

## 2014-01-29 DIAGNOSIS — S72009S Fracture of unspecified part of neck of unspecified femur, sequela: Secondary | ICD-10-CM

## 2014-01-29 DIAGNOSIS — I4891 Unspecified atrial fibrillation: Secondary | ICD-10-CM

## 2014-01-29 DIAGNOSIS — K5900 Constipation, unspecified: Secondary | ICD-10-CM

## 2014-01-29 DIAGNOSIS — S72002S Fracture of unspecified part of neck of left femur, sequela: Secondary | ICD-10-CM

## 2014-02-03 DIAGNOSIS — G609 Hereditary and idiopathic neuropathy, unspecified: Secondary | ICD-10-CM | POA: Insufficient documentation

## 2014-02-03 DIAGNOSIS — K5900 Constipation, unspecified: Secondary | ICD-10-CM | POA: Insufficient documentation

## 2014-02-03 NOTE — Progress Notes (Signed)
Patient ID: Kim [name], female   DOB: May 17, 1918, 80 y.o.   MRN: [id number]                PROGRESS NOTE  DATE: 01/29/2014            FACILITY: [facility]  LEVEL OF CARE: SNF (31)  Discharge Visit  CHIEF COMPLAINT:  Manage left femur fracture and atrial fibrillation.    HISTORY OF PRESENT ILLNESS: I was requested by the social worker to perform face-to-face evaluation for discharge:  Patient was admitted to this facility for short-term rehabilitation after the patient's recent hospitalization.  Patient has completed SNF rehabilitation and therapy has cleared the patient for discharge.  Patient is scheduled to be discharged on 02/06/2014 to [facility] assisted [facility] facility.    Reassessment of ongoing problem(s):  HIP FRACTURE: The patient had a mechanical fall and sustained a femur fracture.  Patient subsequently underwent surgical repair and tolerated the procedure well. Patient is admitted to this facility for short-term rehabilitation. Patient denies hip pain currently. No complications reported from the pain medications currently being used.    ATRIAL FIBRILLATION: the patients atrial fibrillation remains stable.  The patient denies DOE, tachycardia, orthopnea, transient neurological sx, pedal edema, palpitations, & PNDs.  No complications noted from the medications currently being used.     PAST MEDICAL HISTORY : Reviewed.  No changes/see problem list  CURRENT MEDICATIONS: Reviewed per MAR/see medication list  REVIEW OF SYSTEMS:  GENERAL: no change in appetite, no fatigue, no weight changes, no fever, chills or weakness RESPIRATORY: no cough, SOB, DOE, wheezing, hemoptysis CARDIAC: no chest pain, edema or palpitations GI: no abdominal pain, diarrhea, constipation, heart burn, nausea or vomiting  PHYSICAL EXAMINATION  VS:  See VS section  GENERAL: no acute distress, normal body habitus EYES: conjunctivae normal, sclerae normal, normal eye lids NECK:  supple, trachea midline, no neck masses, no thyroid tenderness, no thyromegaly LYMPHATICS: no LAN in the neck, no supraclavicular LAN RESPIRATORY: breathing is even & unlabored, BS CTAB CARDIAC: heart rate is irregular irregular, no murmur,no extra heart sounds, +1 pedal edema      GI: abdomen soft, normal BS, no masses, no tenderness, no hepatomegaly, no splenomegaly PSYCHIATRIC: the patient is alert & oriented to person, affect & behavior appropriate  LABS/RADIOLOGY:  In 10/2013:   Hemoglobin 11.3, MCV 96.6, platelet count 444, WBC 8.7.    In 11/2013:  Albumin 2.8, otherwise CMP normal.    ASSESSMENT/PLAN:  Left hip fracture.  Status post surgery.  Patient is completing SNF rehabilitation.    Atrial fibrillation.  Rate controlled.     Constipation.  Continue current laxatives.    Neuropathy.  Continue Neurontin.    Anemia.  Stable.    Thrombocytosis.  Likely acute phase reactant.   No current symptoms.    I have filled out patient's discharge paperwork and written prescriptions.  Patient will receive home health PT and OT. DME provided:  Rolling walker.    Total discharge time: Greater than 30 minutes Discharge time involved coordination of the discharge process with social worker, nursing staff and therapy department. Medical justification for home health services/DME verified.  CPT CODE: [id number]           Kim CoxGayani Y Therma Lasure, MD [facility] [phone]

## 2014-02-04 ENCOUNTER — Ambulatory Visit (INDEPENDENT_AMBULATORY_CARE_PROVIDER_SITE_OTHER): Payer: Medicare Other | Admitting: Orthopedic Surgery

## 2014-02-04 ENCOUNTER — Ambulatory Visit (HOSPITAL_COMMUNITY): Payer: Medicare Other | Attending: Orthopedic Surgery

## 2014-02-04 VITALS — BP 112/63 | Ht 64.0 in | Wt 136.0 lb

## 2014-02-04 DIAGNOSIS — S7290XD Unspecified fracture of unspecified femur, subsequent encounter for closed fracture with routine healing: Secondary | ICD-10-CM | POA: Diagnosis present

## 2014-02-04 DIAGNOSIS — Z8781 Personal history of (healed) traumatic fracture: Secondary | ICD-10-CM

## 2014-02-04 DIAGNOSIS — S7290XA Unspecified fracture of unspecified femur, initial encounter for closed fracture: Secondary | ICD-10-CM | POA: Insufficient documentation

## 2014-02-04 DIAGNOSIS — S7292XD Unspecified fracture of left femur, subsequent encounter for closed fracture with routine healing: Secondary | ICD-10-CM

## 2014-02-04 NOTE — Progress Notes (Signed)
Followup visit  Chief Complaint  Patient presents with  . Follow-up    6 week recheck on left femur and hip. DOS 10-25-13.    X-ray was done at the hospital she has a proximal femur fracture above a total knee she had a nail done with proximal interlocking and distal interlocking about 3 months ago. She's doing well. She is ambulatory. She uses minimal assisted devices.  [facility] and be transferred to [facility]. Her fracture it has healed without complication  Her clinical exam shows good hip flexion normal leg lengths and rotation. No pain with rotation of the leg or hip  Status post proximal femur fracture above a total knee  Recommend followup as needed

## 2014-04-18 ENCOUNTER — Encounter (HOSPITAL_COMMUNITY): Payer: Self-pay | Admitting: Emergency Medicine

## 2014-04-18 ENCOUNTER — Inpatient Hospital Stay (HOSPITAL_COMMUNITY)
Admission: EM | Admit: 2014-04-18 | Discharge: 2014-04-22 | DRG: 603 | Disposition: A | Discharge status: Nursing Home (Includes ICF) | Payer: Medicare Other | Attending: Internal Medicine | Admitting: Internal Medicine

## 2014-04-18 DIAGNOSIS — M810 Age-related osteoporosis without current pathological fracture: Secondary | ICD-10-CM | POA: Diagnosis present

## 2014-04-18 DIAGNOSIS — L03119 Cellulitis of unspecified part of limb: Secondary | ICD-10-CM

## 2014-04-18 DIAGNOSIS — I2510 Atherosclerotic heart disease of native coronary artery without angina pectoris: Secondary | ICD-10-CM | POA: Diagnosis present

## 2014-04-18 DIAGNOSIS — E860 Dehydration: Secondary | ICD-10-CM | POA: Diagnosis present

## 2014-04-18 DIAGNOSIS — L03116 Cellulitis of left lower limb: Principal | ICD-10-CM

## 2014-04-18 DIAGNOSIS — Z96652 Presence of left artificial knee joint: Secondary | ICD-10-CM | POA: Diagnosis present

## 2014-04-18 DIAGNOSIS — E871 Hypo-osmolality and hyponatremia: Secondary | ICD-10-CM | POA: Diagnosis present

## 2014-04-18 DIAGNOSIS — Z8249 Family history of ischemic heart disease and other diseases of the circulatory system: Secondary | ICD-10-CM

## 2014-04-18 DIAGNOSIS — L02419 Cutaneous abscess of limb, unspecified: Secondary | ICD-10-CM | POA: Diagnosis present

## 2014-04-18 DIAGNOSIS — Z7901 Long term (current) use of anticoagulants: Secondary | ICD-10-CM

## 2014-04-18 DIAGNOSIS — I4891 Unspecified atrial fibrillation: Secondary | ICD-10-CM | POA: Diagnosis present

## 2014-04-18 DIAGNOSIS — M79605 Pain in left leg: Secondary | ICD-10-CM | POA: Diagnosis present

## 2014-04-18 LAB — CBC WITH DIFFERENTIAL/PLATELET
Basophils Absolute: 0 10*3/uL (ref 0.0–0.1)
Basophils Relative: 0 % (ref 0–1)
Eosinophils Absolute: 0.1 10*3/uL (ref 0.0–0.7)
Eosinophils Relative: 1 % (ref 0–5)
HCT: 30.9 % — ABNORMAL LOW (ref 36.0–46.0)
HEMOGLOBIN: 10.4 g/dL — AB (ref 12.0–15.0)
LYMPHS ABS: 1.6 10*3/uL (ref 0.7–4.0)
LYMPHS PCT: 11 % — AB (ref 12–46)
MCH: 32.3 pg (ref 26.0–34.0)
MCHC: 33.7 g/dL (ref 30.0–36.0)
MCV: 96 fL (ref 78.0–100.0)
MONOS PCT: 8 % (ref 3–12)
Monocytes Absolute: 1.2 10*3/uL — ABNORMAL HIGH (ref 0.1–1.0)
NEUTROS PCT: 80 % — AB (ref 43–77)
Neutro Abs: 11.9 10*3/uL — ABNORMAL HIGH (ref 1.7–7.7)
PLATELETS: 224 10*3/uL (ref 150–400)
RBC: 3.22 MIL/uL — ABNORMAL LOW (ref 3.87–5.11)
RDW: 14.1 % (ref 11.5–15.5)
WBC: 14.8 10*3/uL — AB (ref 4.0–10.5)

## 2014-04-18 LAB — COMPREHENSIVE METABOLIC PANEL
ALK PHOS: 88 U/L (ref 39–117)
ALT: 11 U/L (ref 0–35)
AST: 22 U/L (ref 0–37)
Albumin: 3.1 g/dL — ABNORMAL LOW (ref 3.5–5.2)
Anion gap: 13 (ref 5–15)
BUN: 25 mg/dL — ABNORMAL HIGH (ref 6–23)
CHLORIDE: 88 meq/L — AB (ref 96–112)
CO2: 26 meq/L (ref 19–32)
Calcium: 9.1 mg/dL (ref 8.4–10.5)
Creatinine, Ser: 0.85 mg/dL (ref 0.50–1.10)
GFR calc Af Amer: 66 mL/min — ABNORMAL LOW (ref >90)
GFR, EST NON AFRICAN AMERICAN: 57 mL/min — AB (ref >90)
GLUCOSE: 159 mg/dL — AB (ref 70–99)
POTASSIUM: 4 meq/L (ref 3.7–5.3)
Sodium: 127 mEq/L — ABNORMAL LOW (ref 137–147)
Total Bilirubin: 1.1 mg/dL (ref 0.3–1.2)
Total Protein: 7.3 g/dL (ref 6.0–8.3)

## 2014-04-18 LAB — I-STAT CG4 LACTIC ACID, ED: LACTIC ACID, VENOUS: 1.51 mmol/L (ref 0.5–2.2)

## 2014-04-18 MED ORDER — DOCUSATE SODIUM 100 MG PO CAPS
100.0000 mg | ORAL_CAPSULE | Freq: Two times a day (BID) | ORAL | Status: DC
  Administered 2014-04-18 – 2014-04-21 (×7): 100 mg via ORAL
  Filled 2014-04-18 (×8): qty 1

## 2014-04-18 MED ORDER — SODIUM CHLORIDE 0.9 % IV BOLUS (SEPSIS)
500.0000 mL | Freq: Once | INTRAVENOUS | Status: AC
  Administered 2014-04-18: 500 mL via INTRAVENOUS

## 2014-04-18 MED ORDER — ASPIRIN EC 325 MG PO TBEC: 325.0000 mg | DELAYED_RELEASE_TABLET | Freq: Every day | ORAL | Status: DC

## 2014-04-18 MED ORDER — ONDANSETRON HCL 4 MG/2ML IJ SOLN: 4.0000 mg | Freq: Four times a day (QID) | INTRAMUSCULAR | Status: DC | PRN

## 2014-04-18 MED ORDER — GABAPENTIN 100 MG PO CAPS
200.0000 mg | ORAL_CAPSULE | Freq: Three times a day (TID) | ORAL | Status: DC
  Administered 2014-04-18 – 2014-04-22 (×11): 200 mg via ORAL
  Filled 2014-04-18 (×11): qty 2

## 2014-04-18 MED ORDER — ONDANSETRON HCL 4 MG PO TABS: 4.0000 mg | ORAL_TABLET | Freq: Four times a day (QID) | ORAL | Status: DC | PRN

## 2014-04-18 MED ORDER — ASPIRIN EC 325 MG PO TBEC
325.0000 mg | DELAYED_RELEASE_TABLET | Freq: Every day | ORAL | Status: DC
  Administered 2014-04-19 – 2014-04-22 (×4): 325 mg via ORAL
  Filled 2014-04-18 (×4): qty 1

## 2014-04-18 MED ORDER — VANCOMYCIN HCL IN DEXTROSE 1-5 GM/200ML-% IV SOLN
1000.0000 mg | Freq: Once | INTRAVENOUS | Status: AC
  Administered 2014-04-18: 1000 mg via INTRAVENOUS
  Filled 2014-04-18: qty 200

## 2014-04-18 MED ORDER — POLYVINYL ALCOHOL 1.4 % OP SOLN: 1.0000 [drp] | OPHTHALMIC | Status: DC | PRN

## 2014-04-18 MED ORDER — ACETAMINOPHEN 500 MG PO TABS: 500.0000 mg | ORAL_TABLET | Freq: Every day | ORAL | Status: DC

## 2014-04-18 MED ORDER — VANCOMYCIN HCL 500 MG IV SOLR
500.0000 mg | INTRAVENOUS | Status: DC
  Administered 2014-04-19 – 2014-04-22 (×4): 500 mg via INTRAVENOUS
  Filled 2014-04-18 (×5): qty 500

## 2014-04-18 MED ORDER — VITAMIN C 500 MG PO TABS
500.0000 mg | ORAL_TABLET | Freq: Every day | ORAL | Status: DC
  Administered 2014-04-19 – 2014-04-22 (×4): 500 mg via ORAL
  Filled 2014-04-18 (×4): qty 1

## 2014-04-18 MED ORDER — FLUTICASONE PROPIONATE 50 MCG/ACT NA SUSP
1.0000 | Freq: Two times a day (BID) | NASAL | Status: DC | PRN
  Administered 2014-04-22: 1 via NASAL
  Filled 2014-04-18: qty 16

## 2014-04-18 MED ORDER — SODIUM CHLORIDE 0.9 % IV SOLN
INTRAVENOUS | Status: DC
  Administered 2014-04-19 – 2014-04-20 (×2): via INTRAVENOUS

## 2014-04-18 MED ORDER — SENNA 8.6 MG PO TABS
1.0000 | ORAL_TABLET | Freq: Two times a day (BID) | ORAL | Status: DC
  Administered 2014-04-18 – 2014-04-21 (×7): 8.6 mg via ORAL
  Filled 2014-04-18 (×8): qty 1

## 2014-04-18 MED ORDER — HEPARIN SODIUM (PORCINE) 5000 UNIT/ML IJ SOLN
5000.0000 [IU] | Freq: Three times a day (TID) | INTRAMUSCULAR | Status: DC
  Administered 2014-04-18 – 2014-04-22 (×11): 5000 [IU] via SUBCUTANEOUS
  Filled 2014-04-18 (×11): qty 1

## 2014-04-18 MED ORDER — CARVEDILOL 3.125 MG PO TABS
3.1250 mg | ORAL_TABLET | Freq: Two times a day (BID) | ORAL | Status: DC
  Administered 2014-04-19 – 2014-04-22 (×7): 3.125 mg via ORAL
  Filled 2014-04-18 (×7): qty 1

## 2014-04-18 MED ORDER — TRAZODONE HCL 50 MG PO TABS
25.0000 mg | ORAL_TABLET | Freq: Every evening | ORAL | Status: DC | PRN
  Administered 2014-04-18 – 2014-04-21 (×4): 25 mg via ORAL
  Filled 2014-04-18 (×4): qty 1

## 2014-04-18 MED ORDER — SODIUM CHLORIDE 0.9 % IV SOLN
1000.0000 mL | Freq: Once | INTRAVENOUS | Status: AC
  Administered 2014-04-18: 1000 mL via INTRAVENOUS

## 2014-04-18 MED ORDER — HYDROCODONE-ACETAMINOPHEN 5-325 MG PO TABS: 1.0000 | ORAL_TABLET | Freq: Four times a day (QID) | ORAL | Status: DC | PRN

## 2014-04-18 MED ORDER — ACETAMINOPHEN 500 MG PO TABS
500.0000 mg | ORAL_TABLET | Freq: Every day | ORAL | Status: DC
  Administered 2014-04-19 – 2014-04-22 (×4): 500 mg via ORAL
  Filled 2014-04-18 (×4): qty 1

## 2014-04-18 NOTE — ED Notes (Signed)
Having pain to left leg for last couple days.  Rates pain zero.

## 2014-04-18 NOTE — Progress Notes (Signed)
ANTIBIOTIC CONSULT NOTE - INITIAL  Pharmacy Consult for Vancomycin Indication: cellulitis  Allergies  Allergen Reactions  . Nitrofurantoin Rash    Patient Measurements: Height: 5\' 4"  (162.6 cm) Weight: 130 lb (58.968 kg) IBW/kg (Calculated) : 54.7  Vital Signs: Temp: 98 F (36.7 C) (10/23 1436) Temp Source: Oral (10/23 1436) BP: 96/63 mmHg (10/23 1530) Pulse Rate: 103 (10/23 1530) Intake/Output from previous day:   Intake/Output from this shift:    Labs:  Recent Labs  04/18/14 1459  WBC 14.8*  HGB 10.4*  PLT 224  CREATININE 0.85   Estimated Creatinine Clearance: 34.9 ml/min (by C-G formula based on Cr of 0.85). No results found for this basename: VANCOTROUGH, VANCOPEAK, VANCORANDOM, GENTTROUGH, GENTPEAK, GENTRANDOM, TOBRATROUGH, TOBRAPEAK, TOBRARND, AMIKACINPEAK, AMIKACINTROU, AMIKACIN,  in the last 72 hours   Microbiology: No results found for this or any previous visit (from the past 720 hour(s)).  Medical History: Past Medical History  Diagnosis Date  . Atrial fibrillation   . Osteoporosis     Knee; hip  . Wrist fracture 1988    Left  . Detached retina, right   . Chronic anticoagulation   . Compression fracture of thoracic spine, non-traumatic   . Coronary artery disease    Anti-infectives   Start     Dose/Rate Route Frequency Ordered Stop   04/18/14 1600  vancomycin (VANCOCIN) IVPB 1000 mg/200 mL premix     1,000 mg 200 mL/hr over 60 Minutes Intravenous  Once 04/18/14 1539        Assessment: 80yo female with LE cellulitis.  Normalized clcr 30-8635ml/min.    Goal of Therapy:  Vancomycin trough level 10-15 mcg/ml  Plan:  Vancomycin 1000mg  IV today x 1 then Vancomycin 500mg  IV q24hrs Check trough at steady state Monitor labs, renal fxn, and cultures  Valrie HartHall, Krishauna Schatzman A 04/18/2014,4:07 PM

## 2014-04-18 NOTE — ED Notes (Signed)
Notified Dr. Adriana Simasook of dropping BP.  Order recv'd. For additional 500cc saline bolus.

## 2014-04-18 NOTE — H&P (Signed)
[name] History and Physical  Kim [name] VHQ:[id number] DOB: Apr 27, 1918 DOA: 04/18/2014  Referring physician: ER PCP: Catalina Pizza, MD   Chief Complaint: Left leg redness.  HPI: Kim [name] is a 80 y.o. female  This is a 80 year old lady who presents with a two-day history of redness in her left leg. This has affected her leg involve from the mid thigh down to just below the left knee. She has not been feeling feverish. She otherwise feels systemically well. She was  treated at the assisted living facility she lives at with oral antibiotics but this has not helped her significantly. She now comes to the emergency room. Evaluation in the emergency room shows presence of extensive cellulitis. She is now being admitted for further management.   Review of Systems:  Constitutional:  No weight loss, night sweats, Fevers, chills, fatigue.  HEENT:  No headaches, Difficulty swallowing,Tooth/dental problems,Sore throat,  No sneezing, itching, ear ache, nasal congestion, post nasal drip,  Cardio-vascular:  No chest pain, Orthopnea, PND, swelling in lower extremities, anasarca, dizziness, palpitations  GI:  No heartburn, indigestion, abdominal pain, nausea, vomiting, diarrhea, change in bowel habits, loss of appetite  Resp:  No shortness of breath with exertion or at rest. No excess mucus, no productive cough, No non-productive cough, No coughing up of blood.No change in color of mucus.No wheezing.No chest wall deformity    GU:  no dysuria, change in color of urine, no urgency or frequency. No flank pain.  Musculoskeletal:  No joint pain or swelling. No decreased range of motion. No back pain.  Psych:  No change in mood or affect. No depression or anxiety. No memory loss.   Past Medical History  Diagnosis Date  . Atrial fibrillation   . Osteoporosis     Knee; hip  . Wrist fracture 1988    Left  . Detached retina, right   . Chronic anticoagulation   . Compression  fracture of thoracic spine, non-traumatic   . Coronary artery disease    Past Surgical History  Procedure Laterality Date  . Sp kyphoplasty      Percutaneous  . Retinal detachment surgery    . Cataract extraction      Bilateral  . Total knee arthroplasty  2006    Left  . Breast excisional biopsy  1954; 1968    Benign disease  . Orif wrist fracture  1988  . Colonoscopy  2000  . Back surgery    . Femur im nail Left 10/25/2013    Procedure: INTRAMEDULLARY (IM) NAIL FEMORAL WITH DALL MILES CABLE TO LEFT FEMUR;  Surgeon: Vickki Hearing, MD;  Location: [facility];  Service: Orthopedics;  Laterality: Left;  BIOMET    Social History:  reports that she has never smoked. She has never used smokeless tobacco. She reports that she does not drink alcohol or use illicit drugs.  Allergies  Allergen Reactions  . Nitrofurantoin Rash    Family History  Problem Relation Age of Onset  . Heart disease Father     deceased age 73  . Heart attack Mother   . Arthritis       Prior to Admission medications   Medication Sig Start Date End Date Taking? Authorizing Provider  acetaminophen (TYLENOL) 500 MG tablet Take 500 mg by mouth daily.   Yes Historical Provider, MD  ARTIFICIAL TEAR OP Place 1 drop into both eyes 2 (two) times daily.   Yes Historical Provider, MD  Ascorbic Acid (VITAMIN C) 500 MG  tablet Take 500 mg by mouth daily.     Yes Historical Provider, MD  aspirin 325 MG EC tablet Take 325 mg by mouth daily.   Yes Historical Provider, MD  Calcium Carbonate (CALTRATE 600 PO) Take 1 capsule by mouth every morning.    Yes Historical Provider, MD  carvedilol (COREG) 3.125 MG tablet Take 3.125 mg by mouth 2 (two) times daily with a meal.     Yes Historical Provider, MD  cephALEXin (KEFLEX) 500 MG capsule Take 500 mg by mouth 3 (three) times daily. Stop 04/27/14) started 04/16/14   Yes Historical Provider, MD  docusate sodium 100 MG CAPS Take 100 mg by mouth 2 (two) times daily. 10/29/13  Yes Christiane Haorinna L  Sullivan, MD  fluticasone (FLONASE) 50 MCG/ACT nasal spray Place 1 spray into both nostrils 2 (two) times daily as needed for allergies.   Yes Historical Provider, MD  gabapentin (NEURONTIN) 100 MG capsule Take 2 capsules (200 mg total) by mouth 3 (three) times daily. 10/29/13  Yes Christiane Haorinna L Sullivan, MD  HYDROcodone-acetaminophen (NORCO/VICODIN) 5-325 MG per tablet Take 1-2 tablets by mouth every 6 (six) hours as needed for moderate pain. 01/08/14  Yes Mahima Glade LloydPandey, MD  Multiple Vitamins-Minerals (MULTIVITAMIN WITH MINERALS) tablet Take 1 tablet by mouth every morning.    Yes Historical Provider, MD  senna (SENOKOT) 8.6 MG TABS tablet Take 1 tablet (8.6 mg total) by mouth 2 (two) times daily. 10/29/13  Yes Christiane Haorinna L Sullivan, MD  traZODone (DESYREL) 50 MG tablet Take 25 mg by mouth at bedtime as needed for sleep.   Yes Historical Provider, MD  ondansetron (ZOFRAN) 4 MG tablet Take 1 tablet (4 mg total) by mouth every 6 (six) hours as needed for nausea. 10/29/13   Christiane Haorinna L Sullivan, MD   Physical Exam: Filed Vitals:   04/18/14 1630 04/18/14 1645 04/18/14 1700 04/18/14 1730  BP: 100/50 97/58 103/40 95/55  Pulse: 103 97 97 96  Temp:      TempSrc:      Resp:      Height:      Weight:      SpO2: 99% 99% 95% 97%    Wt Readings from Last 3 Encounters:  04/18/14 58.968 kg (130 lb)  02/04/14 61.689 kg (136 lb)  01/29/14 61.871 kg (136 lb 6.4 oz)    General:  Appears calm and comfortable. She looks systemically well. She does not look toxic or septic. She looks slightly dehydrated clinically. Eyes: PERRL, normal lids, irises & conjunctiva ENT: grossly normal hearing, lips & tongue Neck: no LAD, masses or thyromegaly Cardiovascular: RRR, no m/r/g. No LE edema. Telemetry: SR, no arrhythmias  Respiratory: CTA bilaterally, no w/r/r. Normal respiratory effort. Abdomen: soft, ntnd Skin: Extensive cellulitis covering the left leg from the mid thigh down to just below the left knee. There is no obvious  joint involvement of the left knee. There is breakdown in the skin just above the left knee, I suspect this is where the infection started. Musculoskeletal: grossly normal tone BUE/BLE Psychiatric: grossly normal mood and affect, speech fluent and appropriate Neurologic: grossly non-focal.          Labs on Admission:  Basic Metabolic Panel:  Recent Labs Lab 04/18/14 1459  NA 127*  K 4.0  CL 88*  CO2 26  GLUCOSE 159*  BUN 25*  CREATININE 0.85  CALCIUM 9.1   Liver Function Tests:  Recent Labs Lab 04/18/14 1459  AST 22  ALT 11  ALKPHOS 88  BILITOT  1.1  PROT 7.3  ALBUMIN 3.1*     CBC:  Recent Labs Lab 04/18/14 1459  WBC 14.8*  NEUTROABS 11.9*  HGB 10.4*  HCT 30.9*  MCV 96.0  PLT 224   Cardiac Enzymes: No results found for this basename: CKTOTAL, CKMB, CKMBINDEX, TROPONINI,  in the last 168 hours  BNP (last 3 results) No results found for this basename: PROBNP,  in the last 8760 hours CBG: No results found for this basename: GLUCAP,  in the last 168 hours  Radiological Exams on Admission: No results found.    Assessment/Plan   1. Cellulitis of the left leg. 2. Hyponatremia, possibly related to dehydration. 3. History of atrial fibrillation, appears to be in sinus rhythm clinically. Patient is not on chronic anticoagulation therapy. She did have a fractured left hip recently and was discharged from the skilled nursing facility in middle of August.  Plan: 1. Admit to medical floor. 2. Intravenous vancomycin as antibiotic of choice. 3. Obtain electrocardiogram. 4. Intravenous fluids with normal saline.  Other recommendations will depend on patient's hospital progress.   Code Status: Full code for the time being.  DVT Prophylaxis: Heparin.   Family Communication: I discussed the plan with patient at the bedside.   Disposition Plan: Back to the assisted living facility[facility] when medically stable.   Time spent: 60  minutes.  Wilson SingerGOSRANI,NIMISH C [name] Pager [phone].

## 2014-04-18 NOTE — ED Notes (Signed)
Dressed L knee w/telfa, gauze and cling.

## 2014-04-18 NOTE — ED Notes (Addendum)
L lateral knee w/open blister area w/area of edema, erythema, warmth and tenderness, encompassing entire knee and extending proximally and medially. Open area on lateral aspect above knee draining serosanguinous fluid. Tender to light palpation of area to post patellar.  Reports she hit knee on a grab bar causing initial injury, but doesn't remember how long ago.  She uses a w/c to get around.  Able to walk a little w/assistance. Patient also has thin, yellow drainage from eyes bilaterally which she reports has been ongoing x 2 days.  Denies itching, burning or light sensitivity.  Cleaned eyes w/warm, wet washcloth.  Denies pain in knee at present, except to palpation.

## 2014-04-18 NOTE — ED Provider Notes (Signed)
CSN: [id number]     Arrival date & time 04/18/14  1434 History   First MD Initiated Contact with Patient 04/18/14 1504     This chart was scribed for Kim Singer, MD by Kim [name], ED Scribe. This patient was seen in room APA11/APA11 and the patient's care was started 5:40 PM.   Chief Complaint  Patient presents with  . Leg Pain   The history is provided by the patient. No language interpreter was used.    HPI Comments: Level V caveat for mild dementia. Kim [name] is a 80 y.o. female who presents to the Emergency Department complaining of constant, moderate L leg pain x few days that is not improving. Pt was seen previously for same and was diagnosed with cellultus to the L leg. She was started on a course of oral antibiotics. However, symptoms are not improving after start of medication. No recent fever or chills. Pt with known allergy to Nitrofurantoin. [name] is a current resident at [facility]. No other concerns this visit.   Past Medical History  Diagnosis Date  . Atrial fibrillation   . Osteoporosis     Knee; hip  . Wrist fracture 1988    Left  . Detached retina, right   . Chronic anticoagulation   . Compression fracture of thoracic spine, non-traumatic   . Coronary artery disease    Past Surgical History  Procedure Laterality Date  . Sp kyphoplasty      Percutaneous  . Retinal detachment surgery    . Cataract extraction      Bilateral  . Total knee arthroplasty  2006    Left  . Breast excisional biopsy  1954; 1968    Benign disease  . Orif wrist fracture  1988  . Colonoscopy  2000  . Back surgery    . Femur im nail Left 10/25/2013    Procedure: INTRAMEDULLARY (IM) NAIL FEMORAL WITH DALL MILES CABLE TO LEFT FEMUR;  Surgeon: Kim Hearing, MD;  Location: [facility];  Service: Orthopedics;  Laterality: Left;  BIOMET    Family History  Problem Relation Age of Onset  . Heart disease Father     deceased age 86  . Heart attack Mother   . Arthritis      History  Substance Use Topics  . Smoking status: Never Smoker   . Smokeless tobacco: Never Used  . Alcohol Use: No   OB History   Grav Para Term Preterm Abortions TAB SAB Ect Mult Living                 Review of Systems  Unable to perform ROS: Dementia  Musculoskeletal: Positive for arthralgias.  Skin: Positive for color change.      Allergies  Nitrofurantoin  Home Medications   Prior to Admission medications   Medication Sig Start Date End Date Taking? Authorizing Provider  acetaminophen (TYLENOL) 500 MG tablet Take 500 mg by mouth daily.   Yes Historical Provider, MD  ARTIFICIAL TEAR OP Place 1 drop into both eyes 2 (two) times daily.   Yes Historical Provider, MD  Ascorbic Acid (VITAMIN C) 500 MG tablet Take 500 mg by mouth daily.     Yes Historical Provider, MD  aspirin 325 MG EC tablet Take 325 mg by mouth daily.   Yes Historical Provider, MD  Calcium Carbonate (CALTRATE 600 PO) Take 1 capsule by mouth every morning.    Yes Historical Provider, MD  carvedilol (COREG) 3.125 MG tablet Take 3.125  mg by mouth 2 (two) times daily with a meal.     Yes Historical Provider, MD  cephALEXin (KEFLEX) 500 MG capsule Take 500 mg by mouth 3 (three) times daily. Stop 04/27/14) started 04/16/14   Yes Historical Provider, MD  docusate sodium 100 MG CAPS Take 100 mg by mouth 2 (two) times daily. 10/29/13  Yes Kim Haorinna L Sullivan, MD  fluticasone (FLONASE) 50 MCG/ACT nasal spray Place 1 spray into both nostrils 2 (two) times daily as needed for allergies.   Yes Historical Provider, MD  gabapentin (NEURONTIN) 100 MG capsule Take 2 capsules (200 mg total) by mouth 3 (three) times daily. 10/29/13  Yes Kim Haorinna L Sullivan, MD  HYDROcodone-acetaminophen (NORCO/VICODIN) 5-325 MG per tablet Take 1-2 tablets by mouth every 6 (six) hours as needed for moderate pain. 01/08/14  Yes Kim Glade LloydPandey, MD  Multiple Vitamins-Minerals (MULTIVITAMIN WITH MINERALS) tablet Take 1 tablet by mouth every morning.    Yes  Historical Provider, MD  senna (SENOKOT) 8.6 MG TABS tablet Take 1 tablet (8.6 mg total) by mouth 2 (two) times daily. 10/29/13  Yes Kim Haorinna L Sullivan, MD  traZODone (DESYREL) 50 MG tablet Take 25 mg by mouth at bedtime as needed for sleep.   Yes Historical Provider, MD  ondansetron (ZOFRAN) 4 MG tablet Take 1 tablet (4 mg total) by mouth every 6 (six) hours as needed for nausea. 10/29/13   Kim Haorinna L Sullivan, MD   Triage Vitals: BP 103/40  Pulse 97  Temp(Src) 98 F (36.7 C) (Oral)  Resp 16  Ht 5\' 4"  (1.626 m)  Wt 130 lb (58.968 kg)  BMI 22.30 kg/m2  SpO2 95%   Physical Exam  Nursing note and vitals reviewed. Constitutional: She is oriented to person, place, and time. She appears well-developed and well-nourished.  HENT:  Head: Normocephalic and atraumatic.  Eyes: Conjunctivae and EOM are normal. Pupils are equal, round, and reactive to light.  Neck: Normal range of motion. Neck supple.  Cardiovascular: Normal rate, regular rhythm and normal heart sounds.   Pulmonary/Chest: Effort normal and breath sounds normal.  Abdominal: Soft. Bowel sounds are normal.  Musculoskeletal: Normal range of motion. She exhibits tenderness.  Neurological: She is alert and oriented to person, place, and time.  Skin: Skin is warm and dry. There is erythema.  Area of erythema around L knee measuring 18 x 18 cm Medial aspect of thigh with erythema Large central blister/abrasion on lateral  aspect of L thigh  Psychiatric: She has a normal mood and affect. Her behavior is normal.    ED Course  Procedures (including critical care time)  DIAGNOSTIC STUDIES: Oxygen Saturation is 100% on RA, Normal by my interpretation.    COORDINATION OF CARE: 5:40 PM- Will give fluids and vancocin. Will order CBC, I-stat CG4 lactic acid, and CMP. Discussed treatment plan with pt at bedside and pt agreed to plan.     Labs Review Labs Reviewed  CBC WITH DIFFERENTIAL - Abnormal; Notable for the following:    WBC 14.8 (*)     RBC 3.22 (*)    Hemoglobin 10.4 (*)    HCT 30.9 (*)    Neutrophils Relative % 80 (*)    Neutro Abs 11.9 (*)    Lymphocytes Relative 11 (*)    Monocytes Absolute 1.2 (*)    All other components within normal limits  COMPREHENSIVE METABOLIC PANEL - Abnormal; Notable for the following:    Sodium 127 (*)    Chloride 88 (*)    Glucose, Bld  159 (*)    BUN 25 (*)    Albumin 3.1 (*)    GFR calc non Af Amer 57 (*)    GFR calc Af Amer 66 (*)    All other components within normal limits  I-STAT CG4 LACTIC ACID, ED    Imaging Review No results found.   EKG Interpretation None      MDM   Final diagnoses:  Cellulitis of left lower extremity    Patient has obvious cellulitis of left lower extremity. She is hemodynamically stable. IV vancomycin. Admit to general medicine.  I personally performed the services described in this documentation, which was scribed in my presence. The recorded information has been reviewed and is accurate.    Donnetta HutchingBrian Kevontae Burgoon, MD 04/18/14 [phone]

## 2014-04-19 DIAGNOSIS — I4891 Unspecified atrial fibrillation: Secondary | ICD-10-CM

## 2014-04-19 LAB — CBC
HCT: 27.4 % — ABNORMAL LOW (ref 36.0–46.0)
Hemoglobin: 9.1 g/dL — ABNORMAL LOW (ref 12.0–15.0)
MCH: 32 pg (ref 26.0–34.0)
MCHC: 33.2 g/dL (ref 30.0–36.0)
MCV: 96.5 fL (ref 78.0–100.0)
Platelets: 202 10*3/uL (ref 150–400)
RBC: 2.84 MIL/uL — ABNORMAL LOW (ref 3.87–5.11)
RDW: 13.9 % (ref 11.5–15.5)
WBC: 10.2 10*3/uL (ref 4.0–10.5)

## 2014-04-19 LAB — COMPREHENSIVE METABOLIC PANEL
ALT: 11 U/L (ref 0–35)
AST: 21 U/L (ref 0–37)
Albumin: 2.9 g/dL — ABNORMAL LOW (ref 3.5–5.2)
Alkaline Phosphatase: 88 U/L (ref 39–117)
Anion gap: 11 (ref 5–15)
BUN: 19 mg/dL (ref 6–23)
CO2: 24 mEq/L (ref 19–32)
Calcium: 8.5 mg/dL (ref 8.4–10.5)
Chloride: 98 mEq/L (ref 96–112)
Creatinine, Ser: 0.75 mg/dL (ref 0.50–1.10)
GFR calc Af Amer: 81 mL/min — ABNORMAL LOW (ref >90)
GFR calc non Af Amer: 70 mL/min — ABNORMAL LOW (ref >90)
Glucose, Bld: 87 mg/dL (ref 70–99)
Potassium: 3.8 mEq/L (ref 3.7–5.3)
Sodium: 133 mEq/L — ABNORMAL LOW (ref 137–147)
Total Bilirubin: 0.7 mg/dL (ref 0.3–1.2)
Total Protein: 6.5 g/dL (ref 6.0–8.3)

## 2014-04-19 LAB — GLUCOSE, CAPILLARY: Glucose-Capillary: 121 mg/dL — ABNORMAL HIGH (ref 70–99)

## 2014-04-19 LAB — MRSA PCR SCREENING: MRSA by PCR: NEGATIVE

## 2014-04-19 MED ORDER — PIPERACILLIN-TAZOBACTAM 3.375 G IVPB
3.3750 g | Freq: Three times a day (TID) | INTRAVENOUS | Status: DC
  Administered 2014-04-19 – 2014-04-22 (×9): 3.375 g via INTRAVENOUS
  Filled 2014-04-19 (×12): qty 50

## 2014-04-19 NOTE — Progress Notes (Signed)
Heel protectors applied, per pt request. No current signs of redness or pressure ulcer development. Will continue to monitor

## 2014-04-19 NOTE — Progress Notes (Signed)
[name] PROGRESS NOTE  Kim [name]:[id number] DOB: 03-30-1918 DOA: 04/18/2014 PCP: [name], [name], MD  Assessment/Plan: 1. Left leg cellulitis. The patient has a draining wound in the lateral aspect of the left lower thigh. She has surrounding erythema, edema and pain. We'll continue with intravenous antibiotics. Add Zosyn for broader coverage. Will request wound care consult 2. Mild dehydration. Improved with IV fluids 3. Hyponatremia. Related to volume depletion. Improved with IV fluid 4. History of atrial fibrillation. Currently in sinus rhythm. Continue to follow  Code Status: full code Family Communication: discussed with daughter at the bedside Disposition Plan: discharge home once improved   Consultants:    Procedures:    Antibiotics:  Vancomycin 10/23>>  HPI/Subjective: Has continued pain and drainage from left knee wound  Objective: Filed Vitals:   04/19/14 0741  BP: 109/66  Pulse: 109  Temp: 98.4 F (36.9 C)  Resp:    No intake or output data in the 24 hours ending 04/19/14 1325 Filed Weights   04/18/14 1436 04/18/14 1854 04/19/14 0631  Weight: 58.968 kg (130 lb) 62.6 kg (138 lb 0.1 oz) 65.6 kg (144 lb 10 oz)    Exam:   General:  NAD  Cardiovascular: S1, S2 RRR  Respiratory: CTA B  Abdomen: soft, nt,nd, bs+  Musculoskeletal: left lower thigh has wound that is draining with surrounding erythema, most of erythema appears to be superior and lateral to left knee, drainage is serosanguinous, no significant fluctuations/induration appreciated.   Data Reviewed: Basic Metabolic Panel:  Recent Labs Lab 04/18/14 1459 04/19/14 0545  NA 127* 133*  K 4.0 3.8  CL 88* 98  CO2 26 24  GLUCOSE 159* 87  BUN 25* 19  CREATININE 0.85 0.75  CALCIUM 9.1 8.5   Liver Function Tests:  Recent Labs Lab 04/18/14 1459 04/19/14 0545  AST 22 21  ALT 11 11  ALKPHOS 88 88  BILITOT 1.1 0.7  PROT 7.3 6.5  ALBUMIN 3.1* 2.9*   No results found  for this basename: LIPASE, AMYLASE,  in the last 168 hours No results found for this basename: AMMONIA,  in the last 168 hours CBC:  Recent Labs Lab 04/18/14 1459 04/19/14 0545  WBC 14.8* 10.2  NEUTROABS 11.9*  --   HGB 10.4* 9.1*  HCT 30.9* 27.4*  MCV 96.0 96.5  PLT 224 202   Cardiac Enzymes: No results found for this basename: CKTOTAL, CKMB, CKMBINDEX, TROPONINI,  in the last 168 hours BNP (last 3 results) No results found for this basename: PROBNP,  in the last 8760 hours CBG: No results found for this basename: GLUCAP,  in the last 168 hours  Recent Results (from the past 240 hour(s))  MRSA PCR SCREENING     Status: None   Collection Time    04/19/14  4:00 AM      Result Value Ref Range Status   MRSA by PCR NEGATIVE  NEGATIVE Final   Comment:            The GeneXpert MRSA Assay (FDA     approved for NASAL specimens     only), is one component of a     comprehensive MRSA colonization     surveillance program. It is not     intended to diagnose MRSA     infection nor to guide or     monitor treatment for     MRSA infections.     Studies: No results found.  Scheduled Meds: . acetaminophen  500 mg  Oral Daily  . aspirin  325 mg Oral Daily  . carvedilol  3.125 mg Oral BID WC  . docusate sodium  100 mg Oral BID  . gabapentin  200 mg Oral TID  . heparin  5,000 Units Subcutaneous 3 times per day  . senna  1 tablet Oral BID  . vancomycin  500 mg Intravenous Q24H  . vitamin C  500 mg Oral Daily   Continuous Infusions: . sodium chloride 75 mL/hr at 04/19/14 0744    Active Problems:   FIBRILLATION, ATRIAL   Chronic anticoagulation   Cellulitis of left lower extremity   Cellulitis and abscess of leg, except foot    Time spent: 30mins    Kim [name]  [name] Pager [phone]. If 7PM-7AM, please contact night-coverage at [email or web address], password [email or web address] 04/19/2014, 1:25 PM  LOS: 1 day

## 2014-04-20 LAB — BASIC METABOLIC PANEL
Anion gap: 12 (ref 5–15)
BUN: 17 mg/dL (ref 6–23)
CO2: 22 meq/L (ref 19–32)
Calcium: 8.6 mg/dL (ref 8.4–10.5)
Chloride: 101 mEq/L (ref 96–112)
Creatinine, Ser: 0.77 mg/dL (ref 0.50–1.10)
GFR calc Af Amer: 81 mL/min — ABNORMAL LOW (ref >90)
GFR calc non Af Amer: 70 mL/min — ABNORMAL LOW (ref >90)
GLUCOSE: 85 mg/dL (ref 70–99)
POTASSIUM: 3.7 meq/L (ref 3.7–5.3)
Sodium: 135 mEq/L — ABNORMAL LOW (ref 137–147)

## 2014-04-20 LAB — CBC
HEMATOCRIT: 26.7 % — AB (ref 36.0–46.0)
HEMOGLOBIN: 8.9 g/dL — AB (ref 12.0–15.0)
MCH: 32.4 pg (ref 26.0–34.0)
MCHC: 33.3 g/dL (ref 30.0–36.0)
MCV: 97.1 fL (ref 78.0–100.0)
Platelets: 201 10*3/uL (ref 150–400)
RBC: 2.75 MIL/uL — AB (ref 3.87–5.11)
RDW: 14 % (ref 11.5–15.5)
WBC: 6.7 10*3/uL (ref 4.0–10.5)

## 2014-04-20 NOTE — Progress Notes (Signed)
Utilization review Completed Khalaya Mcgurn RN BSN   

## 2014-04-20 NOTE — Progress Notes (Signed)
ANTIBIOTIC CONSULT NOTE - follow up  Pharmacy Consult for Vancomycin and Zosyn Indication: cellulitis  Allergies  Allergen Reactions  . Nitrofurantoin Rash   Patient Measurements: Height: 5\' 4"  (162.6 cm) Weight: 144 lb 10 oz (65.6 kg) IBW/kg (Calculated) : 54.7  Vital Signs: Temp: 98.6 F (37 C) (10/25 0634) Temp Source: Oral (10/25 0634) BP: 132/82 mmHg (10/25 0634) Pulse Rate: 100 (10/25 0634) Intake/Output from previous day:   Intake/Output from this shift:    Labs:  Recent Labs  04/18/14 1459 04/19/14 0545 04/20/14 0541  WBC 14.8* 10.2 6.7  HGB 10.4* 9.1* 8.9*  PLT 224 202 201  CREATININE 0.85 0.75 0.77   Estimated Creatinine Clearance: 37.1 ml/min (by C-G formula based on Cr of 0.77). No results found for this basename: VANCOTROUGH, Leodis BinetVANCOPEAK, VANCORANDOM, GENTTROUGH, GENTPEAK, GENTRANDOM, TOBRATROUGH, TOBRAPEAK, TOBRARND, AMIKACINPEAK, AMIKACINTROU, AMIKACIN,  in the last 72 hours   Microbiology: Recent Results (from the past 720 hour(s))  MRSA PCR SCREENING     Status: None   Collection Time    04/19/14  4:00 AM      Result Value Ref Range Status   MRSA by PCR NEGATIVE  NEGATIVE Final   Comment:            The GeneXpert MRSA Assay (FDA     approved for NASAL specimens     only), is one component of a     comprehensive MRSA colonization     surveillance program. It is not     intended to diagnose MRSA     infection nor to guide or     monitor treatment for     MRSA infections.   Medical History: Past Medical History  Diagnosis Date  . Atrial fibrillation   . Osteoporosis     Knee; hip  . Wrist fracture 1988    Left  . Detached retina, right   . Chronic anticoagulation   . Compression fracture of thoracic spine, non-traumatic   . Coronary artery disease    Anti-infectives   Start     Dose/Rate Route Frequency Ordered Stop   04/19/14 1500  piperacillin-tazobactam (ZOSYN) IVPB 3.375 g     3.375 g 12.5 mL/hr over 240 Minutes Intravenous  Every 8 hours 04/19/14 1351     04/19/14 1000  vancomycin (VANCOCIN) 500 mg in sodium chloride 0.9 % 100 mL IVPB     500 mg 100 mL/hr over 60 Minutes Intravenous Every 24 hours 04/18/14 1948     04/18/14 1600  vancomycin (VANCOCIN) IVPB 1000 mg/200 mL premix     1,000 mg 200 mL/hr over 60 Minutes Intravenous  Once 04/18/14 1539 04/18/14 1651      Assessment: 80yo female with LE cellulitis.  Normalized clcr 30-4735ml/min.   Afebrile.  Renal fxn stable.  WBC normalized.    Goal of Therapy:  Vancomycin trough level 10-15 mcg/ml  Plan:  Vancomycin 500mg  IV q24hrs Check trough at steady state Zosyn 3.375gm IV q8hrs. Monitor labs, renal fxn, and cultures  [name], [name] 04/20/2014,10:00 AM

## 2014-04-20 NOTE — Progress Notes (Signed)
[name] PROGRESS NOTE  Kim [name] RUE:[id number] DOB: [birth year] DOA: 04/18/2014 PCP: [name], [name], MD  Assessment/Plan: 1. Left leg cellulitis. The patient has a draining wound in the lateral aspect of the left lower thigh. She has surrounding erythema, edema and pain. Appears to be improving with intravenous antibiotics. Continue current treatments. Will request wound care consult 2. Mild dehydration. Improved with IV fluids 3. Hyponatremia. Related to volume depletion. Improved with IV fluid 4. History of atrial fibrillation. Currently in sinus rhythm. Continue to follow  Code Status: full code Family Communication: discussed with daughter at the bedside Disposition Plan: discharge home once improved   Consultants:    Procedures:    Antibiotics:  Vancomycin 10/23>>  Zosyn 10/24>>  HPI/Subjective: Left leg is less painful, still draining.  Objective: Filed Vitals:   04/20/14 1451  BP: 117/79  Pulse:   Temp:   Resp:     Intake/Output Summary (Last 24 hours) at 04/20/14 1536 Last data filed at 04/20/14 1300  Gross per 24 hour  Intake    480 ml  Output      0 ml  Net    480 ml   Filed Weights   04/18/14 1436 04/18/14 1854 04/19/14 0631  Weight: 58.968 kg (130 lb) 62.6 kg (138 lb 0.1 oz) 65.6 kg (144 lb 10 oz)    Exam:   General:  NAD  Cardiovascular: S1, S2 RRR  Respiratory: CTA B  Abdomen: soft, nt,nd, bs+  Musculoskeletal: left lower thigh has wound that is draining with surrounding erythema, erythema appears to be improving, not as warm to touch, less tenderness, no significant fluctuations/induration appreciated.   Data Reviewed: Basic Metabolic Panel:  Recent Labs Lab 04/18/14 1459 04/19/14 0545 04/20/14 0541  NA 127* 133* 135*  K 4.0 3.8 3.7  CL 88* 98 101  CO2 26 24 22   GLUCOSE 159* 87 85  BUN 25* 19 17  CREATININE 0.85 0.75 0.77  CALCIUM 9.1 8.5 8.6   Liver Function Tests:  Recent Labs Lab 04/18/14 1459  04/19/14 0545  AST 22 21  ALT 11 11  ALKPHOS 88 88  BILITOT 1.1 0.7  PROT 7.3 6.5  ALBUMIN 3.1* 2.9*   No results found for this basename: LIPASE, AMYLASE,  in the last 168 hours No results found for this basename: AMMONIA,  in the last 168 hours CBC:  Recent Labs Lab 04/18/14 1459 04/19/14 0545 04/20/14 0541  WBC 14.8* 10.2 6.7  NEUTROABS 11.9*  --   --   HGB 10.4* 9.1* 8.9*  HCT 30.9* 27.4* 26.7*  MCV 96.0 96.5 97.1  PLT 224 202 201   Cardiac Enzymes: No results found for this basename: CKTOTAL, CKMB, CKMBINDEX, TROPONINI,  in the last 168 hours BNP (last 3 results) No results found for this basename: PROBNP,  in the last 8760 hours CBG:  Recent Labs Lab 04/19/14 1702  GLUCAP 121*    Recent Results (from the past 240 hour(s))  MRSA PCR SCREENING     Status: None   Collection Time    04/19/14  4:00 AM      Result Value Ref Range Status   MRSA by PCR NEGATIVE  NEGATIVE Final   Comment:            The GeneXpert MRSA Assay (FDA     approved for NASAL specimens     only), is one component of a     comprehensive MRSA colonization     surveillance program. It is not  intended to diagnose MRSA     infection nor to guide or     monitor treatment for     MRSA infections.     Studies: No results found.  Scheduled Meds: . acetaminophen  500 mg Oral Daily  . aspirin  325 mg Oral Daily  . carvedilol  3.125 mg Oral BID WC  . docusate sodium  100 mg Oral BID  . gabapentin  200 mg Oral TID  . heparin  5,000 Units Subcutaneous 3 times per day  . piperacillin-tazobactam (ZOSYN)  IV  3.375 g Intravenous Q8H  . senna  1 tablet Oral BID  . vancomycin  500 mg Intravenous Q24H  . vitamin C  500 mg Oral Daily   Continuous Infusions: . sodium chloride 75 mL/hr at 04/19/14 1900    Active Problems:   FIBRILLATION, ATRIAL   Chronic anticoagulation   Cellulitis of left lower extremity   Cellulitis and abscess of leg, except foot    Time spent:  30mins    [name]  [name] Pager [phone]. If 7PM-7AM, please contact night-coverage at [email or web address], password [email or web address] 04/20/2014, 3:36 PM  LOS: 2 days

## 2014-04-21 NOTE — Progress Notes (Signed)
[name] PROGRESS NOTE  Kim EldersMary M [name] ZOX:[id number] DOB: 09-Dec-1917 DOA: 04/18/2014 PCP: [name], [name], MD  Assessment/Plan: 1. Left leg cellulitis. The patient has a draining wound in the lateral aspect of the left lower thigh. She has surrounding erythema, edema and pain. Appears to be improving with intravenous antibiotics. Continue current treatments. Will request wound care consult. I anticipate she will be able to transition to oral antibiotics tomorrow 2. Mild dehydration. Improved with IV fluids 3. Hyponatremia. Related to volume depletion. Improved with IV fluid 4. History of atrial fibrillation. Currently in sinus rhythm. Continue to follow  Code Status: full code Family Communication: discussed with patient at the bedside Disposition Plan: [facility] back [facility], possibly in AM   Consultants:    Procedures:    Antibiotics:  Vancomycin 10/23>>  Zosyn 10/24>>  HPI/Subjective: Patient feels left leg is improving. Less tender.  Objective: Filed Vitals:   04/21/14 1022  BP: 110/74  Pulse:   Temp:   Resp:     Intake/Output Summary (Last 24 hours) at 04/21/14 1127 Last data filed at 04/21/14 [id number]  Gross per 24 hour  Intake   1530 ml  Output      0 ml  Net   1530 ml   Filed Weights   04/18/14 1436 04/18/14 1854 04/19/14 0631  Weight: 58.968 kg (130 lb) 62.6 kg (138 lb 0.1 oz) 65.6 kg (144 lb 10 oz)    Exam:   General:  NAD  Cardiovascular: S1, S2 RRR  Respiratory: CTA B  Abdomen: soft, nt,nd, bs+  Musculoskeletal: left lower thigh has wound that is draining with surrounding erythema, erythema appears to be improving, not as warm to touch, less tenderness, no significant fluctuations/induration appreciated.   Data Reviewed: Basic Metabolic Panel:  Recent Labs Lab 04/18/14 1459 04/19/14 0545 04/20/14 0541  NA 127* 133* 135*  K 4.0 3.8 3.7  CL 88* 98 101  CO2 26 24 22   GLUCOSE 159* 87 85  BUN 25* 19 17  CREATININE  0.85 0.75 0.77  CALCIUM 9.1 8.5 8.6   Liver Function Tests:  Recent Labs Lab 04/18/14 1459 04/19/14 0545  AST 22 21  ALT 11 11  ALKPHOS 88 88  BILITOT 1.1 0.7  PROT 7.3 6.5  ALBUMIN 3.1* 2.9*   No results found for this basename: LIPASE, AMYLASE,  in the last 168 hours No results found for this basename: AMMONIA,  in the last 168 hours CBC:  Recent Labs Lab 04/18/14 1459 04/19/14 0545 04/20/14 0541  WBC 14.8* 10.2 6.7  NEUTROABS 11.9*  --   --   HGB 10.4* 9.1* 8.9*  HCT 30.9* 27.4* 26.7*  MCV 96.0 96.5 97.1  PLT 224 202 201   Cardiac Enzymes: No results found for this basename: CKTOTAL, CKMB, CKMBINDEX, TROPONINI,  in the last 168 hours BNP (last 3 results) No results found for this basename: PROBNP,  in the last 8760 hours CBG:  Recent Labs Lab 04/19/14 1702  GLUCAP 121*    Recent Results (from the past 240 hour(s))  MRSA PCR SCREENING     Status: None   Collection Time    04/19/14  4:00 AM      Result Value Ref Range Status   MRSA by PCR NEGATIVE  NEGATIVE Final   Comment:            The GeneXpert MRSA Assay (FDA     approved for NASAL specimens     only), is one component of a  comprehensive MRSA colonization     surveillance program. It is not     intended to diagnose MRSA     infection nor to guide or     monitor treatment for     MRSA infections.     Studies: No results found.  Scheduled Meds: . acetaminophen  500 mg Oral Daily  . aspirin  325 mg Oral Daily  . carvedilol  3.125 mg Oral BID WC  . docusate sodium  100 mg Oral BID  . gabapentin  200 mg Oral TID  . heparin  5,000 Units Subcutaneous 3 times per day  . piperacillin-tazobactam (ZOSYN)  IV  3.375 g Intravenous Q8H  . senna  1 tablet Oral BID  . vancomycin  500 mg Intravenous Q24H  . vitamin C  500 mg Oral Daily   Continuous Infusions: . sodium chloride 10 mL/hr at 04/20/14 1608    Active Problems:   FIBRILLATION, ATRIAL   Chronic anticoagulation   Cellulitis of left  lower extremity   Cellulitis and abscess of leg, except foot    Time spent: 30mins    [name],[name]  [name] Pager [phone]. If 7PM-7AM, please contact night-coverage at [email or web address], password [email or web address] 04/21/2014, 11:27 AM  LOS: 3 days

## 2014-04-21 NOTE — Clinical Social Work Psychosocial (Signed)
Clinical Social Work Department BRIEF PSYCHOSOCIAL ASSESSMENT 04/21/2014  Patient:  Kim [name], Kim [name]     Account Number:  [id number]     Admit date:  04/18/2014  Clinical Social Worker:  Wyatt Haste  Date/Time:  04/21/2014 11:57 AM  Referred by:  CSW  Date Referred:  04/21/2014 Referred for  ALF Placement   Other Referral:   Interview type:  Patient Other interview type:    PSYCHOSOCIAL DATA Living Status:  FACILITY Admitted from facility:  [facility] Level of care:  Assisted Living Primary support name:  Carol/Lewis Primary support relationship to patient:  CHILD, ADULT Degree of support available:   supportive per pt    CURRENT CONCERNS Current Concerns  Post-Acute Placement   Other Concerns:    SOCIAL WORK ASSESSMENT / PLAN CSW met with pt at bedside. Pt alert and oriented and has been a resident at [facility] since August. Pt was living at home alone prior to fracturing her hip in April. She then went to [facility] for rehab before transferring to [facility]. Pt has two children who are very involved and supportive and visit regularly. Her daughter-in-law recently had surgery so her son has been with her more right now. Pt indicates her transition to being in a facility has been smooth. She has several friends there and said she is missing them while she is here. Pt reports MD mentioned possible d/c tomorrow and pt is very hopeful for this. Pt does not feel there is any need for CSW to contact her children today. Per [place], pt uses a wheelchair primarily. She ambulates with a walker in therapy only. She requires limited assist with dressing, bathing, and toileting. Pt is currently receiving in house PT. Okay for return.   Assessment/plan status:  Psychosocial Support/Ongoing Assessment of Needs Other assessment/ plan:   Information/referral to community resources:   Robley Fries    PATIENT'S/FAMILY'S RESPONSE TO PLAN OF CARE: Pt reports  very positive feelings regarding return to [facility] when medically stable. CSW will continue to follow.       Benay Pike, [place]

## 2014-04-21 NOTE — Consult Note (Signed)
WOC wound consult note Reason for Consult: Cellulitis to left lateral lower thigh near knee.  History of knee replacement in this knee.   Wound type: Cellulitis to left lower thigh/inflammatory  Measurement: 1 cm x 1.2 cm x 0.3 cm.  Thin film placed over this wound and very diffcult to remove.  Able to remove majority of this transparent adhesive dressing.   Wound bed: 25% slough, 75% ruddy red wound bed.  Erythema extends 1 cm circumferentially around wound.  This is resolving, per patient.  Drainage (amount, consistency, odor) Minimal, serosanguinous drainage.  Periwound: Erythema Dressing procedure/placement/frequency: Cleanse wound to left lateral lower thigh, near knee with NS and pat gently dry.  Apply Iodoform packing strip to wound bed.  Top with 4x4 gauze and secure with Medipore tape.  Change daily. Patient states [facility].  Will not follow at this time.  Please re-consult if needed.  Maple HudsonKaren Kiarah Eckstein RN BSN [facility] Pager [phone]

## 2014-04-22 MED ORDER — SULFAMETHOXAZOLE-TMP DS 800-160 MG PO TABS: 1.0000 | ORAL_TABLET | Freq: Two times a day (BID) | ORAL | Status: DC

## 2014-04-22 NOTE — Progress Notes (Signed)
Patient's IV removed and was clean, dry, and intact at removal.  Patient in stable condition at discharge.  [facility]  Patient escorted to vehicle via wheel chair [facility] nurses and [facility].  Patient assisted into vehicle by 2 nurses, maximum assist.  [facility].

## 2014-04-22 NOTE — Clinical Social Work Note (Signed)
[facility]. Pt and facility aware and agreeable. CSW left voicemail for pt's daughter as she is working. Facility to provide transport. CSW will fax d/c summary upon completion.  Derenda FennelKara Anyssa Sharpless, [place]CS[place] [phone]

## 2014-04-22 NOTE — Discharge Summary (Signed)
Physician Discharge Summary  Kim [name] M [name] ZOX:[id number] DOB: [birth year] DOA: 04/18/2014  PCP: Kim [name], Kim [name]  Admit date: 04/18/2014 Discharge date: 04/22/2014  Time spent: 40 minutes  Recommendations for Outpatient Follow-up:  1. Patient will return to [facility] for further management 2. Follow up with primary care doctor in 1 week to reassess cellulitis  Discharge Diagnoses:  Active Problems:   FIBRILLATION, ATRIAL   Chronic anticoagulation   Cellulitis of left lower extremity   Cellulitis and abscess of leg, except foot   Discharge Condition: improved  Diet recommendation: low salt  Filed Weights   04/18/14 1436 04/18/14 1854 04/19/14 0631  Weight: 58.968 kg (130 lb) 62.6 kg (138 lb 0.1 oz) 65.6 kg (144 lb 10 oz)    History of present illness:  This is a 80 year old lady who presents to the emergency room with a 2 day history of redness in her left leg. She had redness affecting her left leg, involving the mid thigh down to just above the knee. She had a draining wound. She was admitted for further treatments  Hospital Course:  Patient was started on vancomycin and Zosyn. Throughout hospital course, her erythema has resolved. She was seen by wound care who recommended the following: Cleanse wound to left lateral lower thigh, near knee with NS and pat gently dry. Apply Iodoform packing strip to wound bed. Top with 4x4 gauze and secure with Medipore tape. Her erythema has nearly resolved. She has less pain in her knee and has more mobility. He still having some drainage from her wound, but this appears to be improving. She is not febrile and does not have a significant leukocytosis. The transition to oral antibiotics and can be discharged back to assisted living for further management.   Procedures:    Consultations:  Wound care  Discharge Exam: Filed Vitals:   04/22/14 0915  BP: 128/54  Pulse: 95  Temp:   Resp:     General: NAD Cardiovascular: S1, S2  RRR Respiratory: CTA B  Discharge Instructions You were cared for by a hospitalist during your hospital stay. If you have any questions about your discharge medications or the care you received while you were in the hospital after you are discharged, you can call the unit and asked to speak with the hospitalist on call if the hospitalist that took care of you is not available. Once you are discharged, your primary care physician will handle any further medical issues. Please note that NO REFILLS for any discharge medications will be authorized once you are discharged, as it is imperative that you return to your primary care physician (or establish a relationship with a primary care physician if you do not have one) for your aftercare needs so that they can reassess your need for medications and monitor your lab values.  Discharge Instructions   Call [name] for:  redness, tenderness, or signs of infection (pain, swelling, redness, odor or green/yellow discharge around incision site)    Complete by:  As directed      Call [name] for:  temperature >100.4    Complete by:  As directed      Diet - low sodium heart healthy    Complete by:  As directed      Increase activity slowly    Complete by:  As directed           Current Discharge Medication List    START taking these medications   Details  sulfamethoxazole-trimethoprim (BACTRIM DS) 800-160 MG  per tablet Take 1 tablet by mouth 2 (two) times daily. Qty: 8 tablet, Refills: 0      CONTINUE these medications which have NOT CHANGED   Details  acetaminophen (TYLENOL) 500 MG tablet Take 500 mg by mouth daily.    ARTIFICIAL TEAR OP Place 1 drop into both eyes 2 (two) times daily.    Ascorbic Acid (VITAMIN C) 500 MG tablet Take 500 mg by mouth daily.      aspirin 325 MG EC tablet Take 325 mg by mouth daily.    Calcium Carbonate (CALTRATE 600 PO) Take 1 capsule by mouth every morning.     carvedilol (COREG) 3.125 MG tablet Take 3.125 mg by mouth 2  (two) times daily with a meal.      docusate sodium 100 MG CAPS Take 100 mg by mouth 2 (two) times daily. Qty: 10 capsule, Refills: 0    fluticasone (FLONASE) 50 MCG/ACT nasal spray Place 1 spray into both nostrils 2 (two) times daily as needed for allergies.    gabapentin (NEURONTIN) 100 MG capsule Take 2 capsules (200 mg total) by mouth 3 (three) times daily.    HYDROcodone-acetaminophen (NORCO/VICODIN) 5-325 MG per tablet Take 1-2 tablets by mouth every 6 (six) hours as needed for moderate pain. Qty: 240 tablet, Refills: 0    Multiple Vitamins-Minerals (MULTIVITAMIN WITH MINERALS) tablet Take 1 tablet by mouth every morning.     senna (SENOKOT) 8.6 MG TABS tablet Take 1 tablet (8.6 mg total) by mouth 2 (two) times daily. Qty: 120 each, Refills: 0    traZODone (DESYREL) 50 MG tablet Take 25 mg by mouth at bedtime as needed for sleep.    ondansetron (ZOFRAN) 4 MG tablet Take 1 tablet (4 mg total) by mouth every 6 (six) hours as needed for nausea. Qty: 20 tablet, Refills: 0      STOP taking these medications     cephALEXin (KEFLEX) 500 MG capsule        Allergies  Allergen Reactions  . Nitrofurantoin Rash      The results of significant diagnostics from this hospitalization (including imaging, microbiology, ancillary and laboratory) are listed below for reference.    Significant Diagnostic Studies: No results found.  Microbiology: Recent Results (from the past 240 hour(s))  MRSA PCR SCREENING     Status: None   Collection Time    04/19/14  4:00 AM      Result Value Ref Range Status   MRSA by PCR NEGATIVE  NEGATIVE Final   Comment:            The GeneXpert MRSA Assay (FDA     approved for NASAL specimens     only), is one component of a     comprehensive MRSA colonization     surveillance program. It is not     intended to diagnose MRSA     infection nor to guide or     monitor treatment for     MRSA infections.     Labs: Basic Metabolic Panel:  Recent  Labs Lab 04/18/14 1459 04/19/14 0545 04/20/14 0541  NA 127* 133* 135*  K 4.0 3.8 3.7  CL 88* 98 101  CO2 26 24 22   GLUCOSE 159* 87 85  BUN 25* 19 17  CREATININE 0.85 0.75 0.77  CALCIUM 9.1 8.5 8.6   Liver Function Tests:  Recent Labs Lab 04/18/14 1459 04/19/14 0545  AST 22 21  ALT 11 11  ALKPHOS 88 88  BILITOT 1.1 0.7  PROT 7.3 6.5  ALBUMIN 3.1* 2.9*   No results found for this basename: LIPASE, AMYLASE,  in the last 168 hours No results found for this basename: AMMONIA,  in the last 168 hours CBC:  Recent Labs Lab 04/18/14 1459 04/19/14 0545 04/20/14 0541  WBC 14.8* 10.2 6.7  NEUTROABS 11.9*  --   --   HGB 10.4* 9.1* 8.9*  HCT 30.9* 27.4* 26.7*  MCV 96.0 96.5 97.1  PLT 224 202 201   Cardiac Enzymes: No results found for this basename: CKTOTAL, CKMB, CKMBINDEX, TROPONINI,  in the last 168 hours BNP: BNP (last 3 results) No results found for this basename: PROBNP,  in the last 8760 hours CBG:  Recent Labs Lab 04/19/14 1702  GLUCAP 121*       Signed:  [name],[name]  [name] 04/22/2014, 11:28 AM

## 2014-04-23 NOTE — Progress Notes (Deleted)
.  tbu  

## 2014-04-23 NOTE — Progress Notes (Signed)
UR chart review completed.  

## 2014-08-14 ENCOUNTER — Encounter: Payer: Self-pay | Admitting: Orthopedic Surgery

## 2014-08-14 ENCOUNTER — Ambulatory Visit (INDEPENDENT_AMBULATORY_CARE_PROVIDER_SITE_OTHER): Payer: Medicare Other | Admitting: Orthopedic Surgery

## 2014-08-14 VITALS — BP 109/59 | Ht 64.0 in | Wt 144.6 lb

## 2014-08-14 DIAGNOSIS — M25561 Pain in right knee: Secondary | ICD-10-CM

## 2014-08-14 DIAGNOSIS — M129 Arthropathy, unspecified: Secondary | ICD-10-CM

## 2014-08-14 DIAGNOSIS — M1711 Unilateral primary osteoarthritis, right knee: Secondary | ICD-10-CM

## 2014-08-14 NOTE — Progress Notes (Signed)
Patient ID: Kim [name], female   DOB: October 23, 1917, 81 y.o.   MRN: [id number]  Chief Complaint  Patient presents with  . Knee Pain    Right knee pain, no known injury    HPI Kim [name] is a 81 y.o. female.  Presents for evaluation of her right knee. She had a right hip fracture and then since that time she is an increased pain in the right knee which previously bothering her but she was told she was too old for knee replacement surgery. She had a left total knee in her 76s and is now in her 90s and stool to have surgery. She presents for evaluation treatment of disabling right knee pain with aching no swelling no giving way  HPI  Review of Systems Review of Systems  System review the patient says she is healthy she does not complain of any major things at this time daily complains of her right knee, stiff joints some seasonal allergies frequent urination sinusitis constipation occasional irregular heartbeat and some peripheral edema. Past Medical History  Diagnosis Date  . Atrial fibrillation   . Osteoporosis     Knee; hip  . Wrist fracture 1988    Left  . Detached retina, right   . Chronic anticoagulation   . Compression fracture of thoracic spine, non-traumatic   . Coronary artery disease     Past Surgical History  Procedure Laterality Date  . Sp kyphoplasty      Percutaneous  . Retinal detachment surgery    . Cataract extraction      Bilateral  . Total knee arthroplasty  2006    Left  . Breast excisional biopsy  1954; 1968    Benign disease  . Orif wrist fracture  1988  . Colonoscopy  2000  . Back surgery    . Femur im nail Left 10/25/2013    Procedure: INTRAMEDULLARY (IM) NAIL FEMORAL WITH DALL MILES CABLE TO LEFT FEMUR;  Surgeon: Vickki Hearing, MD;  Location: [facility];  Service: Orthopedics;  Laterality: Left;  BIOMET     Family History  Problem Relation Age of Onset  . Heart disease Father     deceased age 58  . Heart attack Mother   . Arthritis       Social History History  Substance Use Topics  . Smoking status: Never Smoker   . Smokeless tobacco: Never Used  . Alcohol Use: No    Allergies  Allergen Reactions  . Nitrofurantoin Rash    Current Outpatient Prescriptions  Medication Sig Dispense Refill  . acetaminophen (TYLENOL) 500 MG tablet Take 500 mg by mouth daily.    . ARTIFICIAL TEAR OP Place 1 drop into both eyes 2 (two) times daily.    . Ascorbic Acid (VITAMIN C) 500 MG tablet Take 500 mg by mouth daily.      Marland Kitchen aspirin 325 MG EC tablet Take 325 mg by mouth daily.    . Calcium Carbonate (CALTRATE 600 PO) Take 1 capsule by mouth every morning.     . carvedilol (COREG) 3.125 MG tablet Take 3.125 mg by mouth 2 (two) times daily with a meal.      . docusate sodium 100 MG CAPS Take 100 mg by mouth 2 (two) times daily. 10 capsule 0  . fluticasone (FLONASE) 50 MCG/ACT nasal spray Place 1 spray into both nostrils 2 (two) times daily as needed for allergies.    Marland Kitchen gabapentin (NEURONTIN) 100 MG capsule Take 2 capsules (200  mg total) by mouth 3 (three) times daily.    Marland Kitchen. HYDROcodone-acetaminophen (NORCO/VICODIN) 5-325 MG per tablet Take 1-2 tablets by mouth every 6 (six) hours as needed for moderate pain. 240 tablet 0  . Multiple Vitamins-Minerals (MULTIVITAMIN WITH MINERALS) tablet Take 1 tablet by mouth every morning.     . ondansetron (ZOFRAN) 4 MG tablet Take 1 tablet (4 mg total) by mouth every 6 (six) hours as needed for nausea. 20 tablet 0  . senna (SENOKOT) 8.6 MG TABS tablet Take 1 tablet (8.6 mg total) by mouth 2 (two) times daily. 120 each 0  . sulfamethoxazole-trimethoprim (BACTRIM DS) 800-160 MG per tablet Take 1 tablet by mouth 2 (two) times daily. 8 tablet 0  . traZODone (DESYREL) 50 MG tablet Take 25 mg by mouth at bedtime as needed for sleep.     No current facility-administered medications for this visit.       Physical Exam Blood pressure 109/59, height 5\' 4"  (1.626 m), weight 144 lb 9.9 oz (65.599  kg). Physical Exam The patient is well developed well nourished and well groomed. Orientation to person place and time is normal  Mood is pleasant. Ambulatory status patient ambulates with a walker and standby assist. She has tenderness over the medial joint line with varus alignment to the knee she can flex the knee well but she has a 20 flexion contracture. The knee remains stable in flexion as well as extension. Strength is normal muscle tone is good skin is intact mild peripheral edema no lymphadenopathy sensation remains normal  Data Reviewed Last x-ray was from 2013 showed severe arthritis with bone to bone contact medial compartment  Assessment    Encounter Diagnoses  Name Primary?  . Right knee pain Yes  . Arthritis of right knee         Plan    Inject right knee  Procedure note right knee injection verbal consent was obtained to inject right knee joint  Timeout was completed to confirm the site of injection  The medications used were 40 mg of Depo-Medrol and 1% lidocaine 3 cc  Anesthesia was provided by ethyl chloride and the skin was prepped with alcohol.  After cleaning the skin with alcohol a 20-gauge needle was used to inject the right knee joint. There were no complications. A sterile bandage was applied.

## 2014-11-04 ENCOUNTER — Encounter: Payer: Self-pay | Admitting: Orthopedic Surgery

## 2014-11-04 ENCOUNTER — Ambulatory Visit (INDEPENDENT_AMBULATORY_CARE_PROVIDER_SITE_OTHER): Payer: Medicare Other | Admitting: Orthopedic Surgery

## 2014-11-04 VITALS — BP 125/64 | Ht 64.0 in | Wt 144.6 lb

## 2014-11-04 DIAGNOSIS — M129 Arthropathy, unspecified: Secondary | ICD-10-CM

## 2014-11-04 DIAGNOSIS — Z8781 Personal history of (healed) traumatic fracture: Secondary | ICD-10-CM

## 2014-11-04 DIAGNOSIS — M1711 Unilateral primary osteoarthritis, right knee: Secondary | ICD-10-CM

## 2014-11-04 NOTE — Progress Notes (Signed)
Follow-up visit  Chief Complaint  Patient presents with  . Follow-up    follow up right knee pain s/p injection 08/14/14    The patient has no complaints of knee pain after injection. She also had a femur fracture treated treated and then after that he'll she has some knee pain and successful treatment with cortisone injection  At 81 years old I think this is probably the treatment we will most likely use in the event her pain comes back  Today her knee flexion is 115 she has a flexion contracture 30 she has some discomfort at terminal flexion at the knee remains stable. She is ambulatory with walker and assistance  Follow-up as neededt

## 2014-11-06 ENCOUNTER — Ambulatory Visit: Payer: Medicare Other | Admitting: Orthopedic Surgery

## 2015-04-12 ENCOUNTER — Encounter (HOSPITAL_COMMUNITY): Payer: Self-pay | Admitting: Emergency Medicine

## 2015-04-12 ENCOUNTER — Inpatient Hospital Stay (HOSPITAL_COMMUNITY)
Admission: EM | Admit: 2015-04-12 | Discharge: 2015-04-14 | DRG: 872 | Disposition: A | Discharge status: Nursing Home (Includes ICF) | Payer: Medicare Other | Attending: Internal Medicine | Admitting: Internal Medicine

## 2015-04-12 ENCOUNTER — Emergency Department (HOSPITAL_COMMUNITY): Payer: Medicare Other

## 2015-04-12 DIAGNOSIS — R509 Fever, unspecified: Secondary | ICD-10-CM

## 2015-04-12 DIAGNOSIS — A419 Sepsis, unspecified organism: Secondary | ICD-10-CM

## 2015-04-12 DIAGNOSIS — Z9181 History of falling: Secondary | ICD-10-CM | POA: Diagnosis not present

## 2015-04-12 DIAGNOSIS — J069 Acute upper respiratory infection, unspecified: Secondary | ICD-10-CM | POA: Diagnosis present

## 2015-04-12 DIAGNOSIS — M810 Age-related osteoporosis without current pathological fracture: Secondary | ICD-10-CM | POA: Diagnosis present

## 2015-04-12 DIAGNOSIS — N390 Urinary tract infection, site not specified: Secondary | ICD-10-CM | POA: Diagnosis present

## 2015-04-12 DIAGNOSIS — I509 Heart failure, unspecified: Secondary | ICD-10-CM

## 2015-04-12 DIAGNOSIS — I4891 Unspecified atrial fibrillation: Secondary | ICD-10-CM | POA: Diagnosis present

## 2015-04-12 DIAGNOSIS — Z7901 Long term (current) use of anticoagulants: Secondary | ICD-10-CM

## 2015-04-12 DIAGNOSIS — J90 Pleural effusion, not elsewhere classified: Secondary | ICD-10-CM

## 2015-04-12 DIAGNOSIS — Z8249 Family history of ischemic heart disease and other diseases of the circulatory system: Secondary | ICD-10-CM | POA: Diagnosis not present

## 2015-04-12 DIAGNOSIS — Z66 Do not resuscitate: Secondary | ICD-10-CM | POA: Diagnosis present

## 2015-04-12 DIAGNOSIS — N3000 Acute cystitis without hematuria: Secondary | ICD-10-CM | POA: Diagnosis not present

## 2015-04-12 DIAGNOSIS — Z96652 Presence of left artificial knee joint: Secondary | ICD-10-CM | POA: Diagnosis present

## 2015-04-12 DIAGNOSIS — I482 Chronic atrial fibrillation: Secondary | ICD-10-CM | POA: Diagnosis present

## 2015-04-12 DIAGNOSIS — D638 Anemia in other chronic diseases classified elsewhere: Secondary | ICD-10-CM | POA: Diagnosis present

## 2015-04-12 HISTORY — DX: Do not resuscitate: Z66

## 2015-04-12 LAB — CBC WITH DIFFERENTIAL/PLATELET
BASOS ABS: 0 10*3/uL (ref 0.0–0.1)
BASOS PCT: 0 %
Eosinophils Absolute: 0 10*3/uL (ref 0.0–0.7)
Eosinophils Relative: 0 %
HEMATOCRIT: 28.3 % — AB (ref 36.0–46.0)
HEMOGLOBIN: 9.2 g/dL — AB (ref 12.0–15.0)
LYMPHS PCT: 5 %
Lymphs Abs: 0.8 10*3/uL (ref 0.7–4.0)
MCH: 31.8 pg (ref 26.0–34.0)
MCHC: 32.5 g/dL (ref 30.0–36.0)
MCV: 97.9 fL (ref 78.0–100.0)
MONO ABS: 2.2 10*3/uL — AB (ref 0.1–1.0)
Monocytes Relative: 14 %
NEUTROS ABS: 12.9 10*3/uL — AB (ref 1.7–7.7)
NEUTROS PCT: 81 %
Platelets: 405 10*3/uL — ABNORMAL HIGH (ref 150–400)
RBC: 2.89 MIL/uL — AB (ref 3.87–5.11)
RDW: 17.2 % — AB (ref 11.5–15.5)
WBC: 15.9 10*3/uL — ABNORMAL HIGH (ref 4.0–10.5)

## 2015-04-12 LAB — TROPONIN I: Troponin I: <0.03 ng/mL (ref <0.031)

## 2015-04-12 LAB — BASIC METABOLIC PANEL
Anion gap: 8 (ref 5–15)
BUN: 21 mg/dL — AB (ref 6–20)
CHLORIDE: 99 mmol/L — AB (ref 101–111)
CO2: 25 mmol/L (ref 22–32)
Calcium: 8.8 mg/dL — ABNORMAL LOW (ref 8.9–10.3)
Creatinine, Ser: 0.83 mg/dL (ref 0.44–1.00)
GFR, EST NON AFRICAN AMERICAN: 58 mL/min — AB (ref >60)
Glucose, Bld: 124 mg/dL — ABNORMAL HIGH (ref 65–99)
POTASSIUM: 4.4 mmol/L (ref 3.5–5.1)
SODIUM: 132 mmol/L — AB (ref 135–145)

## 2015-04-12 LAB — BRAIN NATRIURETIC PEPTIDE: B NATRIURETIC PEPTIDE 5: 473 pg/mL — AB (ref 0.0–100.0)

## 2015-04-12 LAB — I-STAT CG4 LACTIC ACID, ED
LACTIC ACID, VENOUS: 1.11 mmol/L (ref 0.5–2.0)
Lactic Acid, Venous: 1.6 mmol/L (ref 0.5–2.0)

## 2015-04-12 LAB — URINALYSIS, ROUTINE W REFLEX MICROSCOPIC
Bilirubin Urine: NEGATIVE
GLUCOSE, UA: NEGATIVE mg/dL
KETONES UR: NEGATIVE mg/dL
Nitrite: NEGATIVE
PH: 5 (ref 5.0–8.0)
PROTEIN: 30 mg/dL — AB
Urobilinogen, UA: 0.2 mg/dL (ref 0.0–1.0)

## 2015-04-12 LAB — URINE MICROSCOPIC-ADD ON

## 2015-04-12 LAB — PROTIME-INR
INR: 1.57 — AB (ref 0.00–1.49)
PROTHROMBIN TIME: 18.8 s — AB (ref 11.6–15.2)

## 2015-04-12 MED ORDER — SODIUM CHLORIDE 0.9 % IV BOLUS (SEPSIS)
500.0000 mL | Freq: Once | INTRAVENOUS | Status: AC
  Administered 2015-04-12: 500 mL via INTRAVENOUS

## 2015-04-12 MED ORDER — SODIUM CHLORIDE 0.9 % IV SOLN
INTRAVENOUS | Status: DC
  Administered 2015-04-12: 21:00:00 via INTRAVENOUS

## 2015-04-12 MED ORDER — ACETAMINOPHEN 500 MG PO TABS
1000.0000 mg | ORAL_TABLET | Freq: Once | ORAL | Status: AC
  Administered 2015-04-12: 1000 mg via ORAL
  Filled 2015-04-12: qty 2

## 2015-04-12 MED ORDER — FUROSEMIDE 10 MG/ML IJ SOLN
40.0000 mg | Freq: Once | INTRAMUSCULAR | Status: AC
  Administered 2015-04-12: 40 mg via INTRAVENOUS
  Filled 2015-04-12: qty 4

## 2015-04-12 MED ORDER — ALBUTEROL SULFATE (2.5 MG/3ML) 0.083% IN NEBU: 5.0000 mg | INHALATION_SOLUTION | Freq: Once | RESPIRATORY_TRACT | Status: DC

## 2015-04-12 MED ORDER — SODIUM CHLORIDE 0.9 % IV BOLUS (SEPSIS): 500.0000 mL | Freq: Once | INTRAVENOUS | Status: DC

## 2015-04-12 MED ORDER — DEXTROSE 5 % IV SOLN
1.0000 g | Freq: Once | INTRAVENOUS | Status: AC
  Administered 2015-04-12: 1 g via INTRAVENOUS
  Filled 2015-04-12: qty 10

## 2015-04-12 NOTE — ED Notes (Signed)
Pt brought in via EMS with co sob and generally feeling unwell  For past couple of days -- Pt also co Rt knee pain that is a chronic condition

## 2015-04-12 NOTE — ED Provider Notes (Signed)
CSN: [id number]     Arrival date & time 04/12/15  1940 History   First MD Initiated Contact with Patient 04/12/15 2003     Chief Complaint  Patient presents with  . Shortness of Breath      HPI  Pt was seen at 2035. Per EMS and pt report, c/o gradual onset and persistence of constant "not feeling well" for the past 2 to 3 days. Pt states she has been SOB and coughing. Pt states she "thinks" she has had a fever. Denies CP/palpitations, no abd pain, no N/V/D, no back pain, no neck pain, no sore throat, no rash.     Past Medical History  Diagnosis Date  . Atrial fibrillation (HCC)   . Osteoporosis     Knee; hip  . Wrist fracture 1988    Left  . Detached retina, right   . Chronic anticoagulation   . Compression fracture of thoracic spine, non-traumatic (HCC)   . Coronary artery disease   . DNR (do not resuscitate)    Past Surgical History  Procedure Laterality Date  . Sp kyphoplasty      Percutaneous  . Retinal detachment surgery    . Cataract extraction      Bilateral  . Total knee arthroplasty  2006    Left  . Breast excisional biopsy  1954; 1968    Benign disease  . Orif wrist fracture  1988  . Colonoscopy  2000  . Back surgery    . Femur im nail Left 10/25/2013    Procedure: INTRAMEDULLARY (IM) NAIL FEMORAL WITH DALL MILES CABLE TO LEFT FEMUR;  Surgeon: Vickki Hearing, MD;  Location: [facility];  Service: Orthopedics;  Laterality: Left;  BIOMET    Family History  Problem Relation Age of Onset  . Heart disease Father     deceased age 10  . Heart attack Mother   . Arthritis     Social History  Substance Use Topics  . Smoking status: Never Smoker   . Smokeless tobacco: Never Used  . Alcohol Use: No    Review of Systems ROS: Statement: All systems negative except as marked or noted in the HPI; Constitutional: +fever and chills. ; ; Eyes: Negative for eye pain, redness and discharge. ; ; ENMT: Negative for ear pain, hoarseness, nasal congestion, sinus pressure and  sore throat. ; ; Cardiovascular: Negative for chest pain, palpitations, diaphoresis, and peripheral edema. ; ; Respiratory: +SOB, cough. Negative for wheezing and stridor. ; ; Gastrointestinal: Negative for nausea, vomiting, diarrhea, abdominal pain, blood in stool, hematemesis, jaundice and rectal bleeding. . ; ; Genitourinary: Negative for dysuria, flank pain and hematuria. ; ; Musculoskeletal: Negative for back pain and neck pain. Negative for swelling and trauma.; ; Skin: Negative for pruritus, rash, abrasions, blisters, bruising and skin lesion.; ; Neuro: Negative for headache, lightheadedness and neck stiffness. Negative for weakness, altered level of consciousness , altered mental status, extremity weakness, paresthesias, involuntary movement, seizure and syncope.      Allergies  Nitrofurantoin  Home Medications   Prior to Admission medications   Medication Sig Start Date End Date Taking? Authorizing Provider  acetaminophen (TYLENOL) 500 MG tablet Take 500 mg by mouth daily.   Yes Historical Provider, MD  ARTIFICIAL TEAR OP Place 1 drop into both eyes 2 (two) times daily.   Yes Historical Provider, MD  Ascorbic Acid (VITAMIN C) 500 MG tablet Take 500 mg by mouth daily.     Yes Historical Provider, MD  Calcium Carbonate (  CALTRATE 600 PO) Take 1 capsule by mouth every morning.    Yes Historical Provider, MD  carvedilol (COREG) 3.125 MG tablet Take 3.125 mg by mouth 2 (two) times daily with a meal.     Yes Historical Provider, MD  cetirizine (ZYRTEC) 10 MG tablet Take 10 mg by mouth daily.   Yes Historical Provider, MD  docusate sodium 100 MG CAPS Take 100 mg by mouth 2 (two) times daily. Patient taking differently: Take 200 mg by mouth at bedtime.  10/29/13  Yes Christiane Ha, MD  ENSURE (ENSURE) Take 237 mLs by mouth 2 (two) times daily. Strawberry flavored   Yes Historical Provider, MD  fluticasone (FLONASE) 50 MCG/ACT nasal spray Place 1 spray into both nostrils 2 (two) times daily.     Yes Historical Provider, MD  gabapentin (NEURONTIN) 100 MG capsule Take 2 capsules (200 mg total) by mouth 3 (three) times daily. 10/29/13  Yes Christiane Ha, MD  HYDROcodone-acetaminophen (NORCO/VICODIN) 5-325 MG per tablet Take 1-2 tablets by mouth every 6 (six) hours as needed for moderate pain. Patient taking differently: Take 1 tablet by mouth 2 (two) times daily.  01/08/14  Yes Mahima Pandey, MD  levothyroxine (SYNTHROID, LEVOTHROID) 50 MCG tablet Take 50 mcg by mouth daily before breakfast.   Yes Historical Provider, MD  Multiple Vitamins-Minerals (MULTIVITAMIN WITH MINERALS) tablet Take 1 tablet by mouth every morning.    Yes Historical Provider, MD  Olopatadine HCl (PATADAY) 0.2 % SOLN Apply 1 drop to eye daily.   Yes Historical Provider, MD  ondansetron (ZOFRAN) 4 MG tablet Take 1 tablet (4 mg total) by mouth every 6 (six) hours as needed for nausea. 10/29/13  Yes Christiane Ha, MD  senna-docusate (SENEXON-S) 8.6-50 MG tablet Take 1 tablet by mouth 2 (two) times daily.   Yes Historical Provider, MD  traZODone (DESYREL) 50 MG tablet Take 25 mg by mouth at bedtime as needed for sleep.   Yes Historical Provider, MD   BP 121/66 mmHg  Pulse 106  Temp(Src) 102.4 F (39.1 C) (Rectal)  Resp 36  Ht  (1.651 m)  Wt 140 lb (63.504 kg)  BMI 23.30 kg/m2  SpO2 97%   Filed Vitals:   04/12/15 2000 04/12/15 2030 04/12/15 2100 04/12/15 2157  BP: 115/99 117/50 113/63 100/56  Pulse: 112 101 88 103  Temp:    100.5 F (38.1 C)  TempSrc:    Rectal  Resp: 27 32 32 28  Height:      Weight:      SpO2: 97% 96% 85% 94%     Physical Exam  2040: Physical examination:  Nursing notes reviewed; Vital signs and O2 SAT reviewed; +febrile.;; Constitutional: Well developed, Well nourished, Well hydrated, In no acute distress; Head:  Normocephalic, atraumatic; Eyes: EOMI, PERRL, No scleral icterus; ENMT: Mouth and pharynx normal, Mucous membranes moist; Neck: Supple, Full range of motion, No  lymphadenopathy; Cardiovascular: Irregular irregular rate and rhythm, No gallop; Respiratory: Breath sounds coarse & equal bilaterally, No wheezes.  Speaking full sentences with ease, Normal respiratory effort/excursion; Chest: Nontender, Movement normal; Abdomen: Soft, Nontender, Nondistended, Normal bowel sounds; Genitourinary: No CVA tenderness; Extremities: Pulses normal, No tenderness, No edema, No calf edema or asymmetry.; Neuro: AA&Ox3, Major CN grossly intact. No facial droop.  Speech clear. No gross focal motor or sensory deficits in extremities.; Skin: Color normal, Warm, Dry.   ED Course  Procedures (including critical care time) Labs Review   Imaging Review  I have personally reviewed and  evaluated these images and lab results as part of my medical decision-making.   EKG Interpretation None      MDM  MDM Reviewed: previous chart, nursing note and vitals Reviewed previous: labs and ECG Interpretation: labs, x-ray and ECG     ED ECG REPORT   Date: 04/12/2015  Rate: 112  Rhythm: atrial fibrillation  QRS Axis: normal  Intervals: normal  ST/T Wave abnormalities: nonspecific ST/T changes lateral leads  Conduction Disutrbances:none  Narrative Interpretation:   Old EKG Reviewed: changes noted; NS STTW changes lateral leads new since previous EKG dated 10/22/2013.   Results for orders placed or performed during the hospital encounter of 04/12/15  Urinalysis, Routine w reflex microscopic  Result Value Ref Range   Color, Urine YELLOW YELLOW   APPearance CLOUDY (A) CLEAR   Specific Gravity, Urine >1.030 (H) 1.005 - 1.030   pH 5.0 5.0 - 8.0   Glucose, UA NEGATIVE NEGATIVE mg/dL   Hgb urine dipstick MODERATE (A) NEGATIVE   Bilirubin Urine NEGATIVE NEGATIVE   Ketones, ur NEGATIVE NEGATIVE mg/dL   Protein, ur 30 (A) NEGATIVE mg/dL   Urobilinogen, UA 0.2 0.0 - 1.0 mg/dL   Nitrite NEGATIVE NEGATIVE   Leukocytes, UA MODERATE (A) NEGATIVE  CBC with Differential  Result  Value Ref Range   WBC 15.9 (H) 4.0 - 10.5 K/uL   RBC 2.89 (L) 3.87 - 5.11 MIL/uL   Hemoglobin 9.2 (L) 12.0 - 15.0 g/dL   HCT 19.128.3 (L) 47.836.0 - 29.546.0 %   MCV 97.9 78.0 - 100.0 fL   MCH 31.8 26.0 - 34.0 pg   MCHC 32.5 30.0 - 36.0 g/dL   RDW 62.117.2 (H) 30.811.5 - 65.715.5 %   Platelets 405 (H) 150 - 400 K/uL   Neutrophils Relative % 81 %   Neutro Abs 12.9 (H) 1.7 - 7.7 K/uL   Lymphocytes Relative 5 %   Lymphs Abs 0.8 0.7 - 4.0 K/uL   Monocytes Relative 14 %   Monocytes Absolute 2.2 (H) 0.1 - 1.0 K/uL   Eosinophils Relative 0 %   Eosinophils Absolute 0.0 0.0 - 0.7 K/uL   Basophils Relative 0 %   Basophils Absolute 0.0 0.0 - 0.1 K/uL  Troponin I  Result Value Ref Range   Troponin I <0.03 <0.031 ng/mL  Brain natriuretic peptide  Result Value Ref Range   B Natriuretic Peptide 473.0 (H) 0.0 - 100.0 pg/mL  Basic metabolic panel  Result Value Ref Range   Sodium 132 (L) 135 - 145 mmol/L   Potassium 4.4 3.5 - 5.1 mmol/L   Chloride 99 (L) 101 - 111 mmol/L   CO2 25 22 - 32 mmol/L   Glucose, Bld 124 (H) 65 - 99 mg/dL   BUN 21 (H) 6 - 20 mg/dL   Creatinine, Ser 8.460.83 0.44 - 1.00 mg/dL   Calcium 8.8 (L) 8.9 - 10.3 mg/dL   GFR calc non Af Amer 58 (L) >60 mL/min   GFR calc Af Amer >60 >60 mL/min   Anion gap 8 5 - 15  Protime-INR  Result Value Ref Range   Prothrombin Time 18.8 (H) 11.6 - 15.2 seconds   INR 1.57 (H) 0.00 - 1.49  Urine microscopic-add on  Result Value Ref Range   Squamous Epithelial / LPF FEW (A) RARE   WBC, UA TOO NUMEROUS TO COUNT <3 WBC/hpf   RBC / HPF TOO NUMEROUS TO COUNT <3 RBC/hpf   Bacteria, UA MANY (A) RARE  I-Stat CG4 Lactic Acid, ED  Result Value  Ref Range   Lactic Acid, Venous 1.60 0.5 - 2.0 mmol/L   Dg Chest 2 View 04/12/2015  CLINICAL DATA:  Shortness of breath, cough, fever EXAM: CHEST  2 VIEW COMPARISON:  10/22/2013 FINDINGS: Cardiopericardial enlargement with aortic tortuosity. There are small bilateral pleural effusions, greater on the right. No definitive  pulmonary edema. No pneumonia, collapse, or air leak. Chronic appearing T8 compression fracture with advanced height loss. Remote T12 compression fracture with vertebroplasty. IMPRESSION: Cardiomegaly and small pleural effusions. Electronically Signed   By: Marnee Spring M.D.   On: 04/12/2015 21:21    2220:  APAP given for fever with improvement. Judicious IVF given for soft BP. +UTI, UC and BC x2 pending; will dose IV rocephin. H/H per baseline. Dx and testing d/w pt.  Questions answered.  Verb understanding, agreeable to admit. T/C to [facility] Dr. Conley Rolls, case discussed, including:  HPI, pertinent PM/SHx, VS/PE, dx testing, ED course and treatment:  Agreeable to admit, requests he will come to the ED for evaluation.    Samuel Jester, DO 04/15/15 2228

## 2015-04-12 NOTE — H&P (Signed)
[name] History and Physical  Kim [name] RUE:[id number] DOB: Mar 08, 1918    PCP:   Dwana Melena, MD   Chief Complaint: coughs, fever, and SOB.   HPI: Kim [name] is an 81 y.o. female with hx hip Fx one year ago, s/p ORIF, chronic afib, CAD, assisted living resident, with DNR Code status Kim [name]), brought to the ER as she was having subjective fever, sputum productive coughs, and SOB.  She denied chills, abdominal pain, dysuria or polyuria.  Evlaution in the ER Included a CXR which showed bilateral effusion, but no infiltrate, and leukocytosis with WBC of 15K.  Her UA is positive for a UTI, with TNTC WBCs.   Her Cr is normal, and other serology was unremarkable.  She was given IV Rocephin, and hospitlaist was asked to admit her for UTI.    Rewiew of Systems:  Constitutional: Negative for malaise, fever and chills. No significant weight loss or weight gain Eyes: Negative for eye pain, redness and discharge, diplopia, visual changes, or flashes of light. ENMT: Negative for ear pain, hoarseness, nasal congestion, sinus pressure and sore throat. No headaches; tinnitus, drooling, or problem swallowing. Cardiovascular: Negative for chest pain, palpitations, diaphoresis, dyspnea and peripheral edema. ; No orthopnea, PND Respiratory: Negative for hemoptysis, wheezing and stridor. No pleuritic chestpain. Gastrointestinal: Negative for nausea, vomiting, diarrhea, constipation, abdominal pain, melena, blood in stool, hematemesis, jaundice and rectal bleeding.    Genitourinary: Negative for frequency, dysuria, incontinence,flank pain and hematuria; Musculoskeletal: Negative for back pain and neck pain. Negative for swelling and trauma.;  Skin: . Negative for pruritus, rash, abrasions, bruising and skin lesion.; ulcerations Neuro: Negative for headache, lightheadedness and neck stiffness. Negative for weakness, altered level of consciousness , altered mental status, extremity weakness,  burning feet, involuntary movement, seizure and syncope.  Psych: negative for anxiety, depression, insomnia, tearfulness, panic attacks, hallucinations, paranoia, suicidal or homicidal ideation    Past Medical History  Diagnosis Date  . Atrial fibrillation (HCC)   . Osteoporosis     Knee; hip  . Wrist fracture 1988    Left  . Detached retina, right   . Chronic anticoagulation   . Compression fracture of thoracic spine, non-traumatic (HCC)   . Coronary artery disease   . DNR (do not resuscitate)     Past Surgical History  Procedure Laterality Date  . Sp kyphoplasty      Percutaneous  . Retinal detachment surgery    . Cataract extraction      Bilateral  . Total knee arthroplasty  2006    Left  . Breast excisional biopsy  1954; 1968    Benign disease  . Orif wrist fracture  1988  . Colonoscopy  2000  . Back surgery    . Femur im nail Left 10/25/2013    Procedure: INTRAMEDULLARY (IM) NAIL FEMORAL WITH DALL MILES CABLE TO LEFT FEMUR;  Surgeon: Vickki Hearing, MD;  Location: [facility];  Service: Orthopedics;  Laterality: Left;  BIOMET     Medications:  HOME MEDS: Prior to Admission medications   Medication Sig Start Date End Date Taking? Authorizing Provider  acetaminophen (TYLENOL) 500 MG tablet Take 500 mg by mouth daily.   Yes Historical Provider, MD  ARTIFICIAL TEAR OP Place 1 drop into both eyes 2 (two) times daily.   Yes Historical Provider, MD  Ascorbic Acid (VITAMIN C) 500 MG tablet Take 500 mg by mouth daily.     Yes Historical Provider, MD  Calcium Carbonate (CALTRATE 600 PO)  Take 1 capsule by mouth every morning.    Yes Historical Provider, MD  carvedilol (COREG) 3.125 MG tablet Take 3.125 mg by mouth 2 (two) times daily with a meal.     Yes Historical Provider, MD  cetirizine (ZYRTEC) 10 MG tablet Take 10 mg by mouth daily.   Yes Historical Provider, MD  docusate sodium 100 MG CAPS Take 100 mg by mouth 2 (two) times daily. Patient taking differently: Take 200 mg  by mouth at bedtime.  10/29/13  Yes Christiane Ha, MD  ENSURE (ENSURE) Take 237 mLs by mouth 2 (two) times daily. Strawberry flavored   Yes Historical Provider, MD  fluticasone (FLONASE) 50 MCG/ACT nasal spray Place 1 spray into both nostrils 2 (two) times daily.    Yes Historical Provider, MD  gabapentin (NEURONTIN) 100 MG capsule Take 2 capsules (200 mg total) by mouth 3 (three) times daily. 10/29/13  Yes Christiane Ha, MD  HYDROcodone-acetaminophen (NORCO/VICODIN) 5-325 MG per tablet Take 1-2 tablets by mouth every 6 (six) hours as needed for moderate pain. Patient taking differently: Take 1 tablet by mouth 2 (two) times daily.  01/08/14  Yes Mahima Pandey, MD  levothyroxine (SYNTHROID, LEVOTHROID) 50 MCG tablet Take 50 mcg by mouth daily before breakfast.   Yes Historical Provider, MD  Multiple Vitamins-Minerals (MULTIVITAMIN WITH MINERALS) tablet Take 1 tablet by mouth every morning.    Yes Historical Provider, MD  Olopatadine HCl (PATADAY) 0.2 % SOLN Apply 1 drop to eye daily.   Yes Historical Provider, MD  ondansetron (ZOFRAN) 4 MG tablet Take 1 tablet (4 mg total) by mouth every 6 (six) hours as needed for nausea. 10/29/13  Yes Christiane Ha, MD  senna-docusate (SENEXON-S) 8.6-50 MG tablet Take 1 tablet by mouth 2 (two) times daily.   Yes Historical Provider, MD  traZODone (DESYREL) 50 MG tablet Take 25 mg by mouth at bedtime as needed for sleep.   Yes Historical Provider, MD     Allergies:  Allergies  Allergen Reactions  . Nitrofurantoin Rash    Social History:   reports that she has never smoked. She has never used smokeless tobacco. She reports that she does not drink alcohol or use illicit drugs.  Family History: Family History  Problem Relation Age of Onset  . Heart disease Father     deceased age 54  . Heart attack Mother   . Arthritis       Physical Exam: Filed Vitals:   04/12/15 2100 04/12/15 2157 04/12/15 2200 04/12/15 2230  BP: 113/63 100/56 104/51 91/55   Pulse: 88 103 97 96  Temp:  100.5 F (38.1 C)    TempSrc:  Rectal    Resp: 32 28 38 28  Height:      Weight:      SpO2: 85% 94% 96% 92%   Blood pressure 91/55, pulse 96, temperature 100.5 F (38.1 C), temperature source Rectal, resp. rate 28, height  (1.651 m), weight 63.504 kg (140 lb), SpO2 92 %.  GEN:  Pleasant  patient lying in the stretcher in no acute distress; cooperative with exam. PSYCH:  alert and oriented x4; does not appear anxious or depressed; affect is appropriate. HEENT: Mucous membranes pink and anicteric; PERRLA; EOM intact; no cervical lymphadenopathy nor thyromegaly or carotid bruit; no JVD; There were no stridor. Neck is very supple. Breasts:: Not examined CHEST WALL: No tenderness CHEST: Normal respiration, clear to auscultation bilaterally.  HEART: Regular rate and rhythm.  There are no murmur, rub,  or gallops.   BACK: No kyphosis or scoliosis; no CVA tenderness ABDOMEN: soft and non-tender; no masses, no organomegaly, normal abdominal bowel sounds; no pannus; no intertriginous candida. There is no rebound and no distention. Rectal Exam: Not done EXTREMITIES: No bone or joint deformity; age-appropriate arthropathy of the hands and knees; no edema; no ulcerations.  There is no calf tenderness. Genitalia: not examined PULSES: 2+ and symmetric SKIN: Normal hydration no rash or ulceration CNS: Cranial nerves 2-12 grossly intact no focal lateralizing neurologic deficit.  Speech is fluent; uvula elevated with phonation, facial symmetry and tongue midline. DTR are normal bilaterally, cerebella exam is intact, barbinski is negative and strengths are equaled bilaterally.  No sensory loss.   Labs on Admission:  Basic Metabolic Panel:  Recent Labs Lab 04/12/15 2020  NA 132*  K 4.4  CL 99*  CO2 25  GLUCOSE 124*  BUN 21*  CREATININE 0.83  CALCIUM 8.8*   CBC:  Recent Labs Lab 04/12/15 2020  WBC 15.9*  NEUTROABS 12.9*  HGB 9.2*  HCT 28.3*  MCV 97.9   PLT 405*   Cardiac Enzymes:  Recent Labs Lab 04/12/15 2020  TROPONINI <0.03   Radiological Exams on Admission: Dg Chest 2 View  04/12/2015  CLINICAL DATA:  Shortness of breath, cough, fever EXAM: CHEST  2 VIEW COMPARISON:  10/22/2013 FINDINGS: Cardiopericardial enlargement with aortic tortuosity. There are small bilateral pleural effusions, greater on the right. No definitive pulmonary edema. No pneumonia, collapse, or air leak. Chronic appearing T8 compression fracture with advanced height loss. Remote T12 compression fracture with vertebroplasty. IMPRESSION: Cardiomegaly and small pleural effusions. Electronically Signed   By: Marnee SpringJonathon  Watts M.D.   On: 04/12/2015 21:21   Assessment/Plan Present on Admission:  . UTI (urinary tract infection) . URI, acute . FIBRILLATION, ATRIAL  PLAN:  She will be admitted for URI, possible PNA, and UTI.  Will continue with IV Rocephin, and add IV Zithromax to her regimen.  She She has hx of chronic afib, and was previously anticoagulated, but she is no longer on chronic anticoagulation after her fall last year resulting in a hip Fx.  She is otherwise stable, and will be admitted into telemetry.  She is a DNR.  Thank you for allowing me to participate in her care.  Good day.   Other plans as per orders.  Code Status: DNR.    Houston SirenLE,Safina Huard, MD. [name] Pager [phone] 7pm to 7am.  04/12/2015, 11:16 PM

## 2015-04-13 ENCOUNTER — Encounter (HOSPITAL_COMMUNITY): Payer: Self-pay | Admitting: *Deleted

## 2015-04-13 DIAGNOSIS — A419 Sepsis, unspecified organism: Principal | ICD-10-CM

## 2015-04-13 DIAGNOSIS — D638 Anemia in other chronic diseases classified elsewhere: Secondary | ICD-10-CM

## 2015-04-13 DIAGNOSIS — N390 Urinary tract infection, site not specified: Secondary | ICD-10-CM

## 2015-04-13 LAB — TSH: TSH: 4.266 u[IU]/mL (ref 0.350–4.500)

## 2015-04-13 MED ORDER — HEPARIN SODIUM (PORCINE) 5000 UNIT/ML IJ SOLN
5000.0000 [IU] | Freq: Three times a day (TID) | INTRAMUSCULAR | Status: DC
  Administered 2015-04-13 (×3): 5000 [IU] via SUBCUTANEOUS
  Filled 2015-04-13 (×2): qty 1

## 2015-04-13 MED ORDER — TRAZODONE HCL 50 MG PO TABS
25.0000 mg | ORAL_TABLET | Freq: Every evening | ORAL | Status: DC | PRN
  Administered 2015-04-13: 25 mg via ORAL
  Filled 2015-04-13: qty 1

## 2015-04-13 MED ORDER — ADULT MULTIVITAMIN W/MINERALS CH
1.0000 | ORAL_TABLET | Freq: Every morning | ORAL | Status: DC
  Administered 2015-04-13 – 2015-04-14 (×2): 1 via ORAL
  Filled 2015-04-13 (×2): qty 1

## 2015-04-13 MED ORDER — DEXTROSE 5 % IV SOLN
500.0000 mg | INTRAVENOUS | Status: DC
  Administered 2015-04-13 – 2015-04-14 (×2): 500 mg via INTRAVENOUS
  Filled 2015-04-13 (×3): qty 500

## 2015-04-13 MED ORDER — DOCUSATE SODIUM 100 MG PO CAPS
100.0000 mg | ORAL_CAPSULE | Freq: Two times a day (BID) | ORAL | Status: DC
  Administered 2015-04-13 – 2015-04-14 (×3): 100 mg via ORAL
  Filled 2015-04-13 (×3): qty 1

## 2015-04-13 MED ORDER — ONDANSETRON HCL 4 MG PO TABS: 4.0000 mg | ORAL_TABLET | Freq: Four times a day (QID) | ORAL | Status: DC | PRN

## 2015-04-13 MED ORDER — CALCIUM CARBONATE 1500 (600 CA) MG PO TABS
1500.0000 mg | ORAL_TABLET | Freq: Every day | ORAL | Status: DC
  Filled 2015-04-13: qty 1

## 2015-04-13 MED ORDER — CALCIUM CARBONATE 1250 (500 CA) MG PO TABS
1.0000 | ORAL_TABLET | Freq: Every day | ORAL | Status: DC
  Administered 2015-04-13 – 2015-04-14 (×2): 500 mg via ORAL
  Filled 2015-04-13 (×2): qty 1

## 2015-04-13 MED ORDER — VITAMIN C 500 MG PO TABS
500.0000 mg | ORAL_TABLET | Freq: Every day | ORAL | Status: DC
  Administered 2015-04-13 – 2015-04-14 (×2): 500 mg via ORAL
  Filled 2015-04-13 (×2): qty 1

## 2015-04-13 MED ORDER — ONDANSETRON HCL 4 MG/2ML IJ SOLN: 4.0000 mg | Freq: Four times a day (QID) | INTRAMUSCULAR | Status: DC | PRN

## 2015-04-13 MED ORDER — CARVEDILOL 3.125 MG PO TABS
3.1250 mg | ORAL_TABLET | Freq: Two times a day (BID) | ORAL | Status: DC
  Administered 2015-04-13 – 2015-04-14 (×3): 3.125 mg via ORAL
  Filled 2015-04-13 (×3): qty 1

## 2015-04-13 MED ORDER — GABAPENTIN 100 MG PO CAPS
200.0000 mg | ORAL_CAPSULE | Freq: Three times a day (TID) | ORAL | Status: DC
  Administered 2015-04-13 – 2015-04-14 (×4): 200 mg via ORAL
  Filled 2015-04-13 (×4): qty 2

## 2015-04-13 MED ORDER — ACETAMINOPHEN 500 MG PO TABS
500.0000 mg | ORAL_TABLET | Freq: Every day | ORAL | Status: DC
  Administered 2015-04-13 – 2015-04-14 (×2): 500 mg via ORAL
  Filled 2015-04-13 (×2): qty 1

## 2015-04-13 MED ORDER — SENNOSIDES-DOCUSATE SODIUM 8.6-50 MG PO TABS
1.0000 | ORAL_TABLET | Freq: Two times a day (BID) | ORAL | Status: DC
  Administered 2015-04-13 – 2015-04-14 (×3): 1 via ORAL
  Filled 2015-04-13 (×3): qty 1

## 2015-04-13 MED ORDER — DEXTROSE 5 % IV SOLN
1.0000 g | INTRAVENOUS | Status: DC
  Administered 2015-04-13: 1 g via INTRAVENOUS
  Filled 2015-04-13 (×2): qty 10

## 2015-04-13 MED ORDER — OLOPATADINE HCL 0.1 % OP SOLN
1.0000 [drp] | Freq: Two times a day (BID) | OPHTHALMIC | Status: DC
  Administered 2015-04-13 – 2015-04-14 (×3): 1 [drp] via OPHTHALMIC
  Filled 2015-04-13: qty 5

## 2015-04-13 MED ORDER — LEVOTHYROXINE SODIUM 50 MCG PO TABS
50.0000 ug | ORAL_TABLET | Freq: Every day | ORAL | Status: DC
  Administered 2015-04-13 – 2015-04-14 (×2): 50 ug via ORAL
  Filled 2015-04-13 (×2): qty 1

## 2015-04-13 MED ORDER — DEXTROSE 5 % IV SOLN
INTRAVENOUS | Status: AC
  Filled 2015-04-13: qty 500

## 2015-04-13 MED ORDER — FLUTICASONE PROPIONATE 50 MCG/ACT NA SUSP
1.0000 | Freq: Two times a day (BID) | NASAL | Status: DC
  Administered 2015-04-13 – 2015-04-14 (×3): 1 via NASAL
  Filled 2015-04-13: qty 16

## 2015-04-13 NOTE — Progress Notes (Signed)
PROGRESS NOTE  Irish EldersMary M Inoue [id number] DOB: [birth year] DOA: 04/12/2015 PCP: Dwana MelenaZack Hall, MD  Summary: 81 y.o.f with PMH of hip Fx one year ago, s/p ORIF, chronic afib, CAD presented with complaints of subjective fever, sputum productive coughs, and SOB. CXR  In the ED revealed bilateral effusion, but no infiltrate, and leukocytosis with WBC of 15K. Her UA is positive for a UTI, with TNTC WBCs.She was given IV Rocephin, and hospitlaist was asked to admit her for UTI.   Assessment/Plan: 1. UTI with early sepsis. Much improved.  2. URI, no radiographic evidence of pneumonia. No hypoxia. 3. Atrial fibrillation, chronic. Stable. Not a candidate for anticoagulation after a fall last year that resulted in a hip fx.   4. Anemia of chronic disease. Stable.    Overall improved.   Continue IV abx and f/u culture data.  Likely discharge 10/18  Code Status: DNR DVT prophylaxis: Heparin  Family Communication: discussed with sister-in-law at bedside Disposition Plan:   Brendia Sacksaniel Kiyara Bouffard, MD  [name]  Pager [phone] If 7PM-7AM, please contact night-coverage at [email or web address], password [email or web address] 04/13/2015, 7:21 AM  LOS: 1 day   Consultants:    Procedures:    Antibiotics:  Ceftriaxone  10/16 >>  HPI/Subjective: Feels better. Breathing ok. No pain. Eating ok. No n/v. No dysuria.   Objective: Filed Vitals:   04/13/15 0000 04/13/15 0045 04/13/15 0055 04/13/15 0543  BP: 112/64 91/57  111/87  Pulse: 110 106  111  Temp:  98.7 F (37.1 C)  98.5 F (36.9 C)  TempSrc:  Oral  Oral  Resp: 31 22  20   Height:  5\' 5"  (1.651 m)    Weight:  64 kg (141 lb 1.5 oz)    SpO2: 97% 98% 98% 92%    Intake/Output Summary (Last 24 hours) at 04/13/15 0721 Last data filed at 04/13/15 0511  Gross per 24 hour  Intake    250 ml  Output      0 ml  Net    250 ml     Filed Weights   04/12/15 1942 04/13/15 0045  Weight: 63.504 kg (140 lb) 64 kg (141 lb 1.5 oz)     Exam:  Afebrile, VSS, not hypoxic  General:  Appears calm and comfortable Cardiovascular: irregular, no m/r/g. No LE edema. Respiratory: CTA bilaterally, no w/r/r. Normal respiratory effort. Musculoskeletal: grossly normal tone BUE/BLE Psychiatric: grossly normal mood and affect, speech fluent and appropriate Neurologic: grossly non-focal.  New data reviewed:    Pertinent data since admission:  UA positive for UTI  CXR revealed cardiomegaly and small pleural effusions.   Pending data:  UC  BC  Scheduled Meds: . acetaminophen  500 mg Oral Daily  . azithromycin  500 mg Intravenous Q24H  . calcium carbonate  1,500 mg Oral Q breakfast  . carvedilol  3.125 mg Oral BID WC  . cefTRIAXone (ROCEPHIN)  IV  1 g Intravenous Q24H  . docusate sodium  100 mg Oral BID  . fluticasone  1 spray Each Nare BID  . gabapentin  200 mg Oral TID  . heparin  5,000 Units Subcutaneous 3 times per day  . levothyroxine  50 mcg Oral QAC breakfast  . multivitamin with minerals  1 tablet Oral q morning - 10a  . olopatadine  1 drop Both Eyes BID  . senna-docusate  1 tablet Oral BID  . vitamin C  500 mg Oral Daily   Continuous Infusions:   Principal Problem:   UTI (urinary tract infection) Active  Problems:   FIBRILLATION, ATRIAL   URI, acute   Sepsis (HCC)   Anemia of chronic disease   Time spent 20 minutes   By signing my name below, I, [name] attest that this documentation has been prepared under the direction and in the presence of Brendia Sacks, MD Electronically signed: [name]  04/13/2015    I personally performed the services described in this documentation. All medical record entries made by the scribe were at my direction. I have reviewed the chart and agree that the record reflects my personal performance and is accurate and complete. Brendia Sacks, MD

## 2015-04-13 NOTE — Clinical Social Work Note (Signed)
Clinical Social Work Assessment  Patient Details  Name: Kim [name] MRN: [id number] Date of Birth: [birth year]  Date of referral:  04/13/15               Reason for consult:  Facility Placement                Permission sought to share information with:    Permission granted to share information::     Name::        Agency::     Relationship::     Contact Information:     Housing/Transportation Living arrangements for the past 2 months:  [place] of Information:  Patient Patient Interpreter Needed:  None Criminal Activity/Legal Involvement Pertinent to Current Situation/Hospitalization:  No - Comment as needed Significant Relationships:  Adult Children Lives with:  Facility Resident Do you feel safe going back to the place where you live?  Yes Need for family participation in patient care:  Yes (Comment)  Care giving concerns:  Pt is resident at ALF.    Social Worker assessment / plan:  CSW met with pt at bedside. Pt alert and oriented and reports she is a resident at [facility]. She has a son and daughter who visit regularly and are very involved. Pt indicates she was very short of breath yesterday and came to ED for evaluation. Pt admitted with URI/UTI. She shared that she really likes it at [facility] and has made a lot of friends there. Per Tammy at facility, pt has been a resident over a year on AL unit. Staff said she was very short of breath with severe wheezing. Pt has bilateral knee arthritis and is a 1-2 person assist with transfers to wheelchair. She also requires assist with dressing and toileting. Okay to return. Pt just finished home health in house.   Employment status:  Retired Forensic scientist:  Medicare PT Recommendations:  Not assessed at this time [place] / Referral to community resources:  Other (Comment Required) (return to [facility])  Patient/Family's Response to care:  Pt requests to return to [facility] at d/c.    Patient/Family's Understanding of and Emotional Response to Diagnosis, Current Treatment, and Prognosis:  Pt aware of admission diagnosis and is eager to return to [facility] when medically stable.   Emotional Assessment Appearance:  Appears stated age Attitude/Demeanor/Rapport:  Other (Cooperative) Affect (typically observed):  Appropriate Orientation:  Oriented to Self, Oriented to Place, Oriented to  Time, Oriented to Situation Alcohol / Substance use:  Not Applicable Psych involvement (Current and /or in the community):  No (Comment)  Discharge Needs  Concerns to be addressed:  Discharge Planning Concerns Readmission within the last 30 days:  No Current discharge risk:  Physical Impairment Barriers to Discharge:  Continued Medical Work up   Salome Arnt, [place] 04/13/2015, 9:37 AM [phone]

## 2015-04-13 NOTE — Plan of Care (Signed)
Problem: Phase I Progression Outcomes Goal: Pain controlled with appropriate interventions Outcome: Progressing Pt denies pain. Goal: OOB as tolerated unless otherwise ordered Outcome: Not Progressing Pt unable to get oob. Goal: Vital Signs stable- temperature less than 102 Outcome: Progressing See flowsheet.

## 2015-04-13 NOTE — Care Management Note (Signed)
Case Management Note  Patient Details  Name: Irish EldersMary M Shepherd MRN: [id number] Date of Birth: [birth year]  Subjective/Objective:                  [facility]. Anticipate pt will discharge back to facility when medically stable. Pt has a w/c that she uses at the facility.  Action/Plan: CSW is aware and will arrange discharge to facility at discharge.  Expected Discharge Date:                  Expected Discharge Plan:  Assisted Living / Rest Home  In-House Referral:  Clinical Social Work  Discharge planning Services  CM Consult  Post Acute Care Choice:  NA Choice offered to:  NA  DME Arranged:    DME Agency:     HH Arranged:    HH Agency:     Status of Service:  Completed, signed off  Medicare Important Message Given:    Date Medicare IM Given:    Medicare IM give by:    Date Additional Medicare IM Given:    Additional Medicare Important Message give by:     If discussed at Long Length of Stay Meetings, dates discussed:    Additional Comments:  Cheryl FlashBlackwell, Nathalya Wolanski Crowder, RN 04/13/2015, 11:00 AM

## 2015-04-14 DIAGNOSIS — J069 Acute upper respiratory infection, unspecified: Secondary | ICD-10-CM

## 2015-04-14 DIAGNOSIS — I482 Chronic atrial fibrillation: Secondary | ICD-10-CM

## 2015-04-14 DIAGNOSIS — D638 Anemia in other chronic diseases classified elsewhere: Secondary | ICD-10-CM

## 2015-04-14 DIAGNOSIS — N3000 Acute cystitis without hematuria: Secondary | ICD-10-CM

## 2015-04-14 MED ORDER — CIPROFLOXACIN HCL 250 MG PO TABS: 250.0000 mg | ORAL_TABLET | Freq: Two times a day (BID) | ORAL | Status: DC

## 2015-04-14 NOTE — Discharge Summary (Signed)
Physician Discharge Summary  Kim [name] [id number] DOB: 03-01-1918 DOA: 04/12/2015  PCP: Dwana MelenaZack Hall, MD  Admit date: 04/12/2015 Discharge date: 04/14/2015  Time spent: 45 minutes  Recommendations for Outpatient Follow-up:  -Will be discharged back to ALF today. -Would recommend following urine cx results to make sure cipro was an adequate selection.   Discharge Diagnoses:  Principal Problem:   UTI (urinary tract infection) Active Problems:   FIBRILLATION, ATRIAL   URI, acute   Sepsis (HCC)   Anemia of chronic disease   Discharge Condition: Stable and improved  Filed Weights   04/12/15 1942 04/13/15 0045  Weight: 63.504 kg (140 lb) 64 kg (141 lb 1.5 oz)    History of present illness:  As per Dr. Conley RollsLe 10/16: Kim EldersMary M [name] is an 81 y.o. female with hx hip Fx one year ago, s/p ORIF, chronic afib, CAD, assisted living resident, with DNR Code status Kim [name]([place]), brought to the ER as she was having subjective fever, sputum productive coughs, and SOB. She denied chills, abdominal pain, dysuria or polyuria. Evlaution in the ER Included a CXR which showed bilateral effusion, but no infiltrate, and leukocytosis with WBC of 15K. Her UA is positive for a UTI, with TNTC WBCs. Her Cr is normal, and other serology was unremarkable. She was given IV Rocephin, and hospitlaist was asked to admit her for UTI.   Hospital Course:   UTI with early Sepsis -Sepsis parameters improved. -Cx data remains pending at time of DC. -Will DC on cipro 250 mg BID for 5 days. -Recommend following urine cx in OP setting.  URI -No radiographic evidence for PNA, not hypoxia. -Symptomatic management.  Chronic A Fib -Rate controlled. -not a candidate for anticoagulation due to h/o falls.  AOCD -Stable.   Procedures:  None   Consultations:  None  Discharge Instructions  Discharge Instructions    Diet - low sodium heart healthy    Complete by:  As directed      Increase  activity slowly    Complete by:  As directed             Medication List    TAKE these medications        acetaminophen 500 MG tablet  Commonly known as:  TYLENOL  Take 500 mg by mouth daily.     ARTIFICIAL TEAR OP  Place 1 drop into both eyes 2 (two) times daily.     CALTRATE 600 PO  Take 1 capsule by mouth every morning.     carvedilol 3.125 MG tablet  Commonly known as:  COREG  Take 3.125 mg by mouth 2 (two) times daily with a meal.     cetirizine 10 MG tablet  Commonly known as:  ZYRTEC  Take 10 mg by mouth daily.     ciprofloxacin 250 MG tablet  Commonly known as:  CIPRO  Take 1 tablet (250 mg total) by mouth 2 (two) times daily.     DSS 100 MG Caps  Take 100 mg by mouth 2 (two) times daily.     ENSURE  Take 237 mLs by mouth 2 (two) times daily. Strawberry flavored     fluticasone 50 MCG/ACT nasal spray  Commonly known as:  FLONASE  Place 1 spray into both nostrils 2 (two) times daily.     gabapentin 100 MG capsule  Commonly known as:  NEURONTIN  Take 2 capsules (200 mg total) by mouth 3 (three) times daily.     HYDROcodone-acetaminophen 5-325  MG tablet  Commonly known as:  NORCO/VICODIN  Take 1-2 tablets by mouth every 6 (six) hours as needed for moderate pain.     levothyroxine 50 MCG tablet  Commonly known as:  SYNTHROID, LEVOTHROID  Take 50 mcg by mouth daily before breakfast.     multivitamin with minerals tablet  Take 1 tablet by mouth every morning.     ondansetron 4 MG tablet  Commonly known as:  ZOFRAN  Take 1 tablet (4 mg total) by mouth every 6 (six) hours as needed for nausea.     PATADAY 0.2 % Soln  Generic drug:  Olopatadine HCl  Apply 1 drop to eye daily.     SENEXON-S 8.6-50 MG tablet  Generic drug:  senna-docusate  Take 1 tablet by mouth 2 (two) times daily.     traZODone 50 MG tablet  Commonly known as:  DESYREL  Take 25 mg by mouth at bedtime as needed for sleep.     vitamin C 500 MG tablet  Commonly known as:  ASCORBIC  ACID  Take 500 mg by mouth daily.       Allergies  Allergen Reactions  . Nitrofurantoin Rash       Follow-up Information    Follow up with Dwana Melena, MD. Schedule an appointment as soon as possible for a visit in 2 weeks.   Specialty:  Internal Medicine   Contact information:   [address] [place] [address] [phone]        The results of significant diagnostics from this hospitalization (including imaging, microbiology, ancillary and laboratory) are listed below for reference.    Significant Diagnostic Studies: Dg Chest 2 View  04/12/2015  CLINICAL DATA:  Shortness of breath, cough, fever EXAM: CHEST  2 VIEW COMPARISON:  10/22/2013 FINDINGS: Cardiopericardial enlargement with aortic tortuosity. There are small bilateral pleural effusions, greater on the right. No definitive pulmonary edema. No pneumonia, collapse, or air leak. Chronic appearing T8 compression fracture with advanced height loss. Remote T12 compression fracture with vertebroplasty. IMPRESSION: Cardiomegaly and small pleural effusions. Electronically Signed   By: Marnee Spring M.D.   On: 04/12/2015 21:21    Microbiology: Recent Results (from the past 240 hour(s))  Urine culture     Status: None (Preliminary result)   Collection Time: 04/12/15 10:00 PM  Result Value Ref Range Status   Specimen Description URINE, CATHETERIZED  Final   Special Requests NONE  Final   Culture   Final    TOO YOUNG TO READ Performed at [facility]    Report Status PENDING  Incomplete  Culture, blood (routine x 2)     Status: None (Preliminary result)   Collection Time: 04/12/15 10:55 PM  Result Value Ref Range Status   Specimen Description BLOOD LEFT ANTECUBITAL  Final   Special Requests BOTTLES DRAWN AEROBIC AND ANAEROBIC 6CC  Final   Culture NO GROWTH 2 DAYS  Final   Report Status PENDING  Incomplete  Culture, blood (routine x 2)     Status: None (Preliminary result)   Collection Time: 04/12/15 10:58  PM  Result Value Ref Range Status   Specimen Description BLOOD LEFT ANTECUBITAL  Final   Special Requests BOTTLES DRAWN AEROBIC AND ANAEROBIC 6CC  Final   Culture NO GROWTH 2 DAYS  Final   Report Status PENDING  Incomplete     Labs: Basic Metabolic Panel:  Recent Labs Lab 04/12/15 2020  NA 132*  K 4.4  CL 99*  CO2 25  GLUCOSE 124*  BUN 21*  CREATININE 0.83  CALCIUM 8.8*   Liver Function Tests: No results for input(s): AST, ALT, ALKPHOS, BILITOT, PROT, ALBUMIN in the last 168 hours. No results for input(s): LIPASE, AMYLASE in the last 168 hours. No results for input(s): AMMONIA in the last 168 hours. CBC:  Recent Labs Lab 04/12/15 2020  WBC 15.9*  NEUTROABS 12.9*  HGB 9.2*  HCT 28.3*  MCV 97.9  PLT 405*   Cardiac Enzymes:  Recent Labs Lab 04/12/15 2020  TROPONINI <0.03   BNP: BNP (last 3 results)  Recent Labs  04/12/15 2020  BNP 473.0*    ProBNP (last 3 results) No results for input(s): PROBNP in the last 8760 hours.  CBG: No results for input(s): GLUCAP in the last 168 hours.     SignedChaya Jan  [name] Pager: [phone] 04/14/2015, 10:30 AM

## 2015-04-14 NOTE — Progress Notes (Signed)
Jodelle RedMary M Sottile discharged [facility] per MD order.      Medication List    TAKE these medications        acetaminophen 500 MG tablet  Commonly known as:  TYLENOL  Take 500 mg by mouth daily.     ARTIFICIAL TEAR OP  Place 1 drop into both eyes 2 (two) times daily.     CALTRATE 600 PO  Take 1 capsule by mouth every morning.     carvedilol 3.125 MG tablet  Commonly known as:  COREG  Take 3.125 mg by mouth 2 (two) times daily with a meal.     cetirizine 10 MG tablet  Commonly known as:  ZYRTEC  Take 10 mg by mouth daily.     ciprofloxacin 250 MG tablet  Commonly known as:  CIPRO  Take 1 tablet (250 mg total) by mouth 2 (two) times daily.     DSS 100 MG Caps  Take 100 mg by mouth 2 (two) times daily.     ENSURE  Take 237 mLs by mouth 2 (two) times daily. Strawberry flavored     fluticasone 50 MCG/ACT nasal spray  Commonly known as:  FLONASE  Place 1 spray into both nostrils 2 (two) times daily.     gabapentin 100 MG capsule  Commonly known as:  NEURONTIN  Take 2 capsules (200 mg total) by mouth 3 (three) times daily.     HYDROcodone-acetaminophen 5-325 MG tablet  Commonly known as:  NORCO/VICODIN  Take 1-2 tablets by mouth every 6 (six) hours as needed for moderate pain.     levothyroxine 50 MCG tablet  Commonly known as:  SYNTHROID, LEVOTHROID  Take 50 mcg by mouth daily before breakfast.     multivitamin with minerals tablet  Take 1 tablet by mouth every morning.     ondansetron 4 MG tablet  Commonly known as:  ZOFRAN  Take 1 tablet (4 mg total) by mouth every 6 (six) hours as needed for nausea.     PATADAY 0.2 % Soln  Generic drug:  Olopatadine HCl  Apply 1 drop to eye daily.     SENEXON-S 8.6-50 MG tablet  Generic drug:  senna-docusate  Take 1 tablet by mouth 2 (two) times daily.     traZODone 50 MG tablet  Commonly known as:  DESYREL  Take 25 mg by mouth at bedtime as needed for sleep.     vitamin C 500 MG tablet  Commonly known as:   ASCORBIC ACID  Take 500 mg by mouth daily.        Patients skin is clean, dry and intact, no evidence of skin break down. IV site discontinued and catheter remains intact. Site without signs and symptoms of complications. Dressing and pressure applied.  Patient transported via wheelchair by NT, no distress noted upon discharge.  Hoover BrunetteBonnie M Holbrook 04/14/2015 2:01 PM

## 2015-04-14 NOTE — Care Management Important Message (Signed)
Important Message  Patient Details  Name: Irish EldersMary M Hasz MRN: [id number] Date of Birth: [birth year]   Medicare Important Message Given:  N/A - LOS <3 / Initial given by admissions    Cheryl FlashBlackwell, Bari Handshoe Crowder, RN 04/14/2015, 11:21 AM

## 2015-04-14 NOTE — Clinical Social Work Note (Signed)
Pt d/c today back to [facility]. Pt, son, and facility aware and agreeable. D/C summary and FL2 faxed. Pt to transport via facility van.  Derenda FennelKara Finnleigh Marchetti, LCSW [phone]

## 2015-04-14 NOTE — Care Management Note (Signed)
Case Management Note  Patient Details  Name: Kim [name] MRN: [id number] Date of Birth: 02/12/1918  Subjective/Objective:                    Action/Plan:   Expected Discharge Date:                  Expected Discharge Plan:  Assisted Living / Rest Home  In-House Referral:  Clinical Social Work  Discharge planning Services  CM Consult  Post Acute Care Choice:  NA Choice offered to:  NA  DME Arranged:    DME Agency:     HH Arranged:    HH Agency:     Status of Service:  Completed, signed off  Medicare Important Message Given:    Date Medicare IM Given:    Medicare IM give by:    Date Additional Medicare IM Given:    Additional Medicare Important Message give by:     If discussed at Long Length of Stay Meetings, dates discussed:    Additional Comments: [facility]. CSW to arrange discharge to facility. Kim [name], Kim Steenson Boonevillerowder, RN 04/14/2015, 11:06 AM

## 2015-04-16 LAB — URINE CULTURE

## 2015-04-17 LAB — CULTURE, BLOOD (ROUTINE X 2)
CULTURE: NO GROWTH
CULTURE: NO GROWTH

## 2015-04-26 ENCOUNTER — Encounter (HOSPITAL_COMMUNITY): Payer: Self-pay | Admitting: *Deleted

## 2015-04-26 ENCOUNTER — Inpatient Hospital Stay (HOSPITAL_COMMUNITY)
Admission: EM | Admit: 2015-04-26 | Discharge: 2015-04-29 | DRG: 871 | Disposition: A | Discharge status: Nursing Home (Includes ICF) | Payer: Medicare Other | Attending: Internal Medicine | Admitting: Internal Medicine

## 2015-04-26 ENCOUNTER — Emergency Department (HOSPITAL_COMMUNITY): Payer: Medicare Other

## 2015-04-26 DIAGNOSIS — E871 Hypo-osmolality and hyponatremia: Secondary | ICD-10-CM | POA: Diagnosis present

## 2015-04-26 DIAGNOSIS — I482 Chronic atrial fibrillation: Secondary | ICD-10-CM | POA: Diagnosis present

## 2015-04-26 DIAGNOSIS — Z7901 Long term (current) use of anticoagulants: Secondary | ICD-10-CM

## 2015-04-26 DIAGNOSIS — I4891 Unspecified atrial fibrillation: Secondary | ICD-10-CM | POA: Diagnosis present

## 2015-04-26 DIAGNOSIS — Y95 Nosocomial condition: Secondary | ICD-10-CM | POA: Diagnosis present

## 2015-04-26 DIAGNOSIS — Z96652 Presence of left artificial knee joint: Secondary | ICD-10-CM | POA: Diagnosis present

## 2015-04-26 DIAGNOSIS — M810 Age-related osteoporosis without current pathological fracture: Secondary | ICD-10-CM | POA: Diagnosis present

## 2015-04-26 DIAGNOSIS — A419 Sepsis, unspecified organism: Secondary | ICD-10-CM | POA: Diagnosis present

## 2015-04-26 DIAGNOSIS — J90 Pleural effusion, not elsewhere classified: Secondary | ICD-10-CM

## 2015-04-26 DIAGNOSIS — D638 Anemia in other chronic diseases classified elsewhere: Secondary | ICD-10-CM | POA: Diagnosis present

## 2015-04-26 DIAGNOSIS — Z66 Do not resuscitate: Secondary | ICD-10-CM | POA: Diagnosis present

## 2015-04-26 DIAGNOSIS — R945 Abnormal results of liver function studies: Secondary | ICD-10-CM

## 2015-04-26 DIAGNOSIS — Z8249 Family history of ischemic heart disease and other diseases of the circulatory system: Secondary | ICD-10-CM | POA: Diagnosis not present

## 2015-04-26 DIAGNOSIS — R0602 Shortness of breath: Secondary | ICD-10-CM | POA: Diagnosis present

## 2015-04-26 DIAGNOSIS — J189 Pneumonia, unspecified organism: Secondary | ICD-10-CM | POA: Diagnosis present

## 2015-04-26 DIAGNOSIS — J948 Other specified pleural conditions: Secondary | ICD-10-CM | POA: Diagnosis not present

## 2015-04-26 DIAGNOSIS — R0902 Hypoxemia: Secondary | ICD-10-CM | POA: Diagnosis present

## 2015-04-26 DIAGNOSIS — R7989 Other specified abnormal findings of blood chemistry: Secondary | ICD-10-CM

## 2015-04-26 DIAGNOSIS — Z9889 Other specified postprocedural states: Secondary | ICD-10-CM

## 2015-04-26 DIAGNOSIS — D539 Nutritional anemia, unspecified: Secondary | ICD-10-CM | POA: Diagnosis present

## 2015-04-26 LAB — COMPREHENSIVE METABOLIC PANEL
ALK PHOS: 102 U/L (ref 38–126)
ALT: 223 U/L — AB (ref 14–54)
AST: 413 U/L — ABNORMAL HIGH (ref 15–41)
Albumin: 3.1 g/dL — ABNORMAL LOW (ref 3.5–5.0)
Anion gap: 10 (ref 5–15)
BUN: 33 mg/dL — ABNORMAL HIGH (ref 6–20)
CALCIUM: 8.8 mg/dL — AB (ref 8.9–10.3)
CHLORIDE: 98 mmol/L — AB (ref 101–111)
CO2: 25 mmol/L (ref 22–32)
CREATININE: 0.76 mg/dL (ref 0.44–1.00)
Glucose, Bld: 97 mg/dL (ref 65–99)
Potassium: 4.6 mmol/L (ref 3.5–5.1)
Sodium: 133 mmol/L — ABNORMAL LOW (ref 135–145)
Total Bilirubin: 1.6 mg/dL — ABNORMAL HIGH (ref 0.3–1.2)
Total Protein: 6.7 g/dL (ref 6.5–8.1)

## 2015-04-26 LAB — TROPONIN I: Troponin I: 0.03 ng/mL (ref <0.031)

## 2015-04-26 LAB — URINALYSIS, ROUTINE W REFLEX MICROSCOPIC
BILIRUBIN URINE: NEGATIVE
Glucose, UA: NEGATIVE mg/dL
KETONES UR: NEGATIVE mg/dL
Leukocytes, UA: NEGATIVE
NITRITE: NEGATIVE
PROTEIN: 30 mg/dL — AB
UROBILINOGEN UA: 0.2 mg/dL (ref 0.0–1.0)
pH: 5.5 (ref 5.0–8.0)

## 2015-04-26 LAB — CBC
HCT: 33.6 % — ABNORMAL LOW (ref 36.0–46.0)
Hemoglobin: 10.5 g/dL — ABNORMAL LOW (ref 12.0–15.0)
MCH: 32.2 pg (ref 26.0–34.0)
MCHC: 31.3 g/dL (ref 30.0–36.0)
MCV: 103.1 fL — ABNORMAL HIGH (ref 78.0–100.0)
PLATELETS: 245 10*3/uL (ref 150–400)
RBC: 3.26 MIL/uL — AB (ref 3.87–5.11)
RDW: 21.9 % — ABNORMAL HIGH (ref 11.5–15.5)
WBC: 14.6 10*3/uL — AB (ref 4.0–10.5)

## 2015-04-26 LAB — LACTIC ACID, PLASMA
LACTIC ACID, VENOUS: 2.8 mmol/L — AB (ref 0.5–2.0)
LACTIC ACID, VENOUS: 2.8 mmol/L — AB (ref 0.5–2.0)
Lactic Acid, Venous: 2.9 mmol/L (ref 0.5–2.0)

## 2015-04-26 LAB — PROCALCITONIN: Procalcitonin: <0.1 ng/mL

## 2015-04-26 LAB — URINE MICROSCOPIC-ADD ON

## 2015-04-26 LAB — BRAIN NATRIURETIC PEPTIDE: B Natriuretic Peptide: 547 pg/mL — ABNORMAL HIGH (ref 0.0–100.0)

## 2015-04-26 MED ORDER — SODIUM CHLORIDE 0.9 % IV BOLUS (SEPSIS)
1000.0000 mL | INTRAVENOUS | Status: AC
  Administered 2015-04-26 (×2): 1000 mL via INTRAVENOUS

## 2015-04-26 MED ORDER — ALBUTEROL SULFATE (2.5 MG/3ML) 0.083% IN NEBU: 2.5000 mg | INHALATION_SOLUTION | RESPIRATORY_TRACT | Status: AC | PRN

## 2015-04-26 MED ORDER — VANCOMYCIN HCL IN DEXTROSE 1-5 GM/200ML-% IV SOLN
1000.0000 mg | Freq: Once | INTRAVENOUS | Status: AC
  Administered 2015-04-26: 1000 mg via INTRAVENOUS
  Filled 2015-04-26: qty 200

## 2015-04-26 MED ORDER — HYDROCODONE-ACETAMINOPHEN 5-325 MG PO TABS: 1.0000 | ORAL_TABLET | Freq: Four times a day (QID) | ORAL | Status: DC | PRN

## 2015-04-26 MED ORDER — SODIUM CHLORIDE 0.9 % IJ SOLN: 3.0000 mL | INTRAMUSCULAR | Status: DC | PRN

## 2015-04-26 MED ORDER — CARVEDILOL 3.125 MG PO TABS
3.1250 mg | ORAL_TABLET | Freq: Two times a day (BID) | ORAL | Status: DC
  Administered 2015-04-26 – 2015-04-28 (×4): 3.125 mg via ORAL
  Filled 2015-04-26 (×4): qty 1

## 2015-04-26 MED ORDER — GABAPENTIN 100 MG PO CAPS
200.0000 mg | ORAL_CAPSULE | Freq: Three times a day (TID) | ORAL | Status: DC
  Administered 2015-04-26 – 2015-04-29 (×9): 200 mg via ORAL
  Filled 2015-04-26 (×9): qty 2

## 2015-04-26 MED ORDER — LORATADINE 10 MG PO TABS
10.0000 mg | ORAL_TABLET | Freq: Every day | ORAL | Status: DC
Start: 2015-04-27 — End: 2015-04-29
  Administered 2015-04-27 – 2015-04-29 (×3): 10 mg via ORAL
  Filled 2015-04-26 (×3): qty 1

## 2015-04-26 MED ORDER — ACETAMINOPHEN 325 MG PO TABS
650.0000 mg | ORAL_TABLET | Freq: Four times a day (QID) | ORAL | Status: DC | PRN
Start: 2015-04-26 — End: 2015-04-29

## 2015-04-26 MED ORDER — SODIUM CHLORIDE 0.9 % IV SOLN: 250.0000 mL | INTRAVENOUS | Status: DC | PRN

## 2015-04-26 MED ORDER — SODIUM CHLORIDE 0.9 % IV BOLUS (SEPSIS)
250.0000 mL | Freq: Once | INTRAVENOUS | Status: AC
  Administered 2015-04-26: 250 mL via INTRAVENOUS

## 2015-04-26 MED ORDER — FLUTICASONE PROPIONATE 50 MCG/ACT NA SUSP
1.0000 | Freq: Two times a day (BID) | NASAL | Status: DC
  Administered 2015-04-26 – 2015-04-29 (×6): 1 via NASAL
  Filled 2015-04-26: qty 16

## 2015-04-26 MED ORDER — DEXTROSE 5 % IV SOLN
2.0000 g | Freq: Two times a day (BID) | INTRAVENOUS | Status: DC
  Administered 2015-04-26 – 2015-04-29 (×6): 2 g via INTRAVENOUS
  Filled 2015-04-26 (×8): qty 2

## 2015-04-26 MED ORDER — SODIUM CHLORIDE 0.9 % IJ SOLN
3.0000 mL | Freq: Two times a day (BID) | INTRAMUSCULAR | Status: DC
  Administered 2015-04-26 – 2015-04-29 (×4): 3 mL via INTRAVENOUS

## 2015-04-26 MED ORDER — VANCOMYCIN HCL IN DEXTROSE 750-5 MG/150ML-% IV SOLN
750.0000 mg | INTRAVENOUS | Status: DC
  Filled 2015-04-26: qty 150

## 2015-04-26 MED ORDER — SODIUM CHLORIDE 0.9 % IV SOLN
INTRAVENOUS | Status: DC
  Administered 2015-04-26: 14:00:00 via INTRAVENOUS

## 2015-04-26 MED ORDER — DEXTROSE 5 % IV SOLN
INTRAVENOUS | Status: AC
  Filled 2015-04-26: qty 2

## 2015-04-26 MED ORDER — ENOXAPARIN SODIUM 40 MG/0.4ML ~~LOC~~ SOLN
40.0000 mg | SUBCUTANEOUS | Status: DC
  Administered 2015-04-26 – 2015-04-28 (×3): 40 mg via SUBCUTANEOUS
  Filled 2015-04-26 (×3): qty 0.4

## 2015-04-26 MED ORDER — ALBUTEROL SULFATE (2.5 MG/3ML) 0.083% IN NEBU
5.0000 mg | INHALATION_SOLUTION | Freq: Once | RESPIRATORY_TRACT | Status: AC
  Administered 2015-04-26: 5 mg via RESPIRATORY_TRACT
  Filled 2015-04-26: qty 6

## 2015-04-26 MED ORDER — PIPERACILLIN-TAZOBACTAM 3.375 G IVPB 30 MIN
3.3750 g | Freq: Three times a day (TID) | INTRAVENOUS | Status: DC
  Administered 2015-04-26: 3.375 g via INTRAVENOUS
  Filled 2015-04-26 (×4): qty 50

## 2015-04-26 MED ORDER — VANCOMYCIN HCL IN DEXTROSE 750-5 MG/150ML-% IV SOLN
750.0000 mg | INTRAVENOUS | Status: DC
  Administered 2015-04-27 – 2015-04-28 (×2): 750 mg via INTRAVENOUS
  Filled 2015-04-26 (×4): qty 150

## 2015-04-26 MED ORDER — SODIUM CHLORIDE 0.9 % IJ SOLN
3.0000 mL | Freq: Two times a day (BID) | INTRAMUSCULAR | Status: DC
  Administered 2015-04-27 – 2015-04-28 (×3): 3 mL via INTRAVENOUS

## 2015-04-26 MED ORDER — ACETAMINOPHEN 650 MG RE SUPP: 650.0000 mg | Freq: Four times a day (QID) | RECTAL | Status: DC | PRN

## 2015-04-26 MED ORDER — PIPERACILLIN-TAZOBACTAM 3.375 G IVPB
3.3750 g | Freq: Three times a day (TID) | INTRAVENOUS | Status: DC
  Filled 2015-04-26 (×3): qty 50

## 2015-04-26 MED ORDER — TRAZODONE HCL 50 MG PO TABS: 25.0000 mg | ORAL_TABLET | Freq: Every evening | ORAL | Status: DC | PRN

## 2015-04-26 MED ORDER — LEVOTHYROXINE SODIUM 25 MCG PO TABS
50.0000 ug | ORAL_TABLET | Freq: Every day | ORAL | Status: DC
  Administered 2015-04-27 – 2015-04-29 (×3): 50 ug via ORAL
  Filled 2015-04-26 (×3): qty 2

## 2015-04-26 NOTE — ED Notes (Signed)
Patient transported to X-ray 

## 2015-04-26 NOTE — ED Notes (Signed)
CRITICAL VALUE ALERT  Critical value received:  Lactic Acid 2.8  Date of notification:  04/26/2015  Time of notification:  1333  Critical value read back:Yes.    Nurse who received alert:  Tarri Glennrystal Blackburn RN   MD notified (1st page):  Dr Clarene DukeMcManus  Time of first page:  1333  MD notified (2nd page):  Time of second page:  Responding MD:  Dr Clarene DukeMcManus  Time MD responded:  [phone]

## 2015-04-26 NOTE — ED Notes (Signed)
Unable to stand for orthostatic, standing pressure

## 2015-04-26 NOTE — Progress Notes (Addendum)
ANTIBIOTIC CONSULT NOTE - INITIAL  Pharmacy Consult for Vancomycin and Ceftazidime Indication: pneumonia  Allergies  Allergen Reactions  . Nitrofurantoin Rash    Patient Measurements: Height: 5\' 4"  (162.6 cm) Weight: 126 lb (57.153 kg) IBW/kg (Calculated) : 54.7  Vital Signs: Temp: 98.8 F (37.1 C) (10/30 1308) Temp Source: Rectal (10/30 1308) BP: 128/74 mmHg (10/30 1257) Pulse Rate: 117 (10/30 1349) Intake/Output from previous day:   Intake/Output from this shift: Total I/O In: -  Out: 20 [Urine:20]  Labs:  Recent Labs  04/26/15 1139  WBC 14.6*  HGB 10.5*  PLT 245  CREATININE 0.76   Estimated Creatinine Clearance: 36.3 mL/min (by C-G formula based on Cr of 0.76). No results for input(s): VANCOTROUGH, VANCOPEAK, VANCORANDOM, GENTTROUGH, GENTPEAK, GENTRANDOM, TOBRATROUGH, TOBRAPEAK, TOBRARND, AMIKACINPEAK, AMIKACINTROU, AMIKACIN in the last 72 hours.   Microbiology: Recent Results (from the past 720 hour(s))  Urine culture     Status: None   Collection Time: 04/12/15 10:00 PM  Result Value Ref Range Status   Specimen Description URINE, CATHETERIZED  Final   Special Requests NONE  Final   Culture   Final    MULTIPLE SPECIES PRESENT, SUGGEST RECOLLECTION Performed [facility]    Report Status 04/16/2015 FINAL  Final  Culture, blood (routine x 2)     Status: None   Collection Time: 04/12/15 10:55 PM  Result Value Ref Range Status   Specimen Description BLOOD LEFT ANTECUBITAL  Final   Special Requests BOTTLES DRAWN AEROBIC AND ANAEROBIC 6CC  Final   Culture NO GROWTH 5 DAYS  Final   Report Status 04/17/2015 FINAL  Final  Culture, blood (routine x 2)     Status: None   Collection Time: 04/12/15 10:58 PM  Result Value Ref Range Status   Specimen Description BLOOD LEFT ANTECUBITAL  Final   Special Requests BOTTLES DRAWN AEROBIC AND ANAEROBIC 6CC  Final   Culture NO GROWTH 5 DAYS  Final   Report Status 04/17/2015 FINAL  Final    Medical  History: Past Medical History  Diagnosis Date  . Atrial fibrillation (HCC)   . Osteoporosis     Knee; hip  . Wrist fracture 1988    Left  . Detached retina, right   . Chronic anticoagulation   . Compression fracture of thoracic spine, non-traumatic (HCC)   . Coronary artery disease   . DNR (do not resuscitate)     Medications:  See medication hx  Assessment: 81 yo female presented with SOB, coughing, wheezing, and generalized weakness/fatigue. No fevers or chills. Elevated WBC. CXray shows worsening right suprahilar airspace disease and progressive moderate right pleural effusion. Empiric tx with Vancomycin and ceftazidime for HCAP  Goal of Therapy:  Vancomycin trough level 15-20 mcg/ml  Plan:  Vancomycin 1gm LD, then 750mg  iv q24h Ceftazidime 2gm iv q12h Measure antibiotic drug levels at steady state Follow up culture results  Monitor V/S, labs  Elder CyphersLorie Tesha Archambeau, BS Loura BackPharm D, BCPS Clinical Pharmacist Pager [phone] 04/26/2015,1:57 PM

## 2015-04-26 NOTE — H&P (Signed)
History and Physical  Kim [name] ZOX:[id number] DOB: 1918/04/03 DOA: 04/26/2015  Referring physician: Samuel JesterKathleen McManus, DO PCP: Dwana MelenaZack Hall, MD   Chief Complaint:  SOB  HPI:  81 yow PMH chronic afib and CAD presented with SOB and wheezing. While in the ED CXR revealed pneumonia and labs with mild elevation of lactic acid. Admitted for treatment of HCAP. Of note, Discharged 10/18, treated for UTI with sepsis at that time, URI without evidence of pneumonia or hypoxia.  Has not felt good the last few days. Mild dysuria. Has been SOB, worse over the last several days. No fever or systemic symptoms. Poor appetite. Because of SOB today she was sent to the ED.  In the emergency department Afebrile, VSS, not hypoxic.  Pertinent labs: Sodium 133, BUN 33, Creatinine .76, BNP 547, troponin .03, lactic acid 2.8, WBC 14.6, Hgb 10.5 EKG: Independently reviewed. EKG independent review: Afib RVR Imaging: independently reviewed. CXR revealed worsening right suprahilar airspace disease and progressive moderate right pleural effusion.   Review of Systems:  Positive for SOB, dysuria Negative for fever, visual changes, sore throat, rash, new muscle aches, chest pain, bleeding, n/v/abdominal pain.  Past Medical History  Diagnosis Date  . Atrial fibrillation (HCC)   . Osteoporosis     Knee; hip  . Wrist fracture 1988    Left  . Detached retina, right   . Chronic anticoagulation   . Compression fracture of thoracic spine, non-traumatic (HCC)   . Coronary artery disease   . DNR (do not resuscitate)     Past Surgical History  Procedure Laterality Date  . Sp kyphoplasty      Percutaneous  . Retinal detachment surgery    . Cataract extraction      Bilateral  . Total knee arthroplasty  2006    Left  . Breast excisional biopsy  1954; 1968    Benign disease  . Orif wrist fracture  1988  . Colonoscopy  2000  . Back surgery    . Femur im nail Left 10/25/2013    Procedure: INTRAMEDULLARY (IM) NAIL  FEMORAL WITH DALL MILES CABLE TO LEFT FEMUR;  Surgeon: Vickki HearingStanley E Harrison, MD;  Location: [facility];  Service: Orthopedics;  Laterality: Left;  BIOMET     Social History:  reports that she has never smoked. She has never used smokeless tobacco. She reports that she does not drink alcohol or use illicit drugs. lives in an assisted living facility Partial assistance  Allergies  Allergen Reactions  . Nitrofurantoin Rash    Family History  Problem Relation Age of Onset  . Heart disease Father     deceased age 178  . Heart attack Mother   . Arthritis       Prior to Admission medications   Medication Sig Start Date End Date Taking? Authorizing Provider  acetaminophen (TYLENOL) 500 MG tablet Take 500 mg by mouth daily.   Yes Historical Provider, MD  ARTIFICIAL TEAR OP Place 1 drop into both eyes 2 (two) times daily.   Yes Historical Provider, MD  Ascorbic Acid (VITAMIN C) 500 MG tablet Take 500 mg by mouth daily.     Yes Historical Provider, MD  Calcium Carbonate (CALTRATE 600 PO) Take 1 capsule by mouth every morning.    Yes Historical Provider, MD  carvedilol (COREG) 3.125 MG tablet Take 3.125 mg by mouth 2 (two) times daily with a meal.     Yes Historical Provider, MD  cetirizine (ZYRTEC) 10 MG tablet Take 10 mg by  mouth daily.   Yes Historical Provider, MD  docusate sodium 100 MG CAPS Take 100 mg by mouth 2 (two) times daily. Patient taking differently: Take 200 mg by mouth at bedtime.  10/29/13  Yes Christiane Ha, MD  ENSURE (ENSURE) Take 237 mLs by mouth 2 (two) times daily. Strawberry flavored   Yes Historical Provider, MD  fluticasone (FLONASE) 50 MCG/ACT nasal spray Place 1 spray into both nostrils 2 (two) times daily.    Yes Historical Provider, MD  gabapentin (NEURONTIN) 100 MG capsule Take 2 capsules (200 mg total) by mouth 3 (three) times daily. 10/29/13  Yes Christiane Ha, MD  HYDROcodone-acetaminophen (NORCO/VICODIN) 5-325 MG per tablet Take 1-2 tablets by mouth every 6  (six) hours as needed for moderate pain. Patient taking differently: Take 1 tablet by mouth 2 (two) times daily.  01/08/14  Yes Mahima Pandey, MD  levofloxacin (LEVAQUIN) 500 MG tablet Take 1 tablet by mouth daily. For 7 days, started on 10/27 04/23/15  Yes Historical Provider, MD  levothyroxine (SYNTHROID, LEVOTHROID) 50 MCG tablet Take 50 mcg by mouth daily before breakfast.   Yes Historical Provider, MD  Multiple Vitamins-Minerals (MULTIVITAMIN WITH MINERALS) tablet Take 1 tablet by mouth every morning.    Yes Historical Provider, MD  Olopatadine HCl (PATADAY) 0.2 % SOLN Apply 1 drop to eye daily.   Yes Historical Provider, MD  ondansetron (ZOFRAN) 4 MG tablet Take 1 tablet (4 mg total) by mouth every 6 (six) hours as needed for nausea. 10/29/13  Yes Christiane Ha, MD  PROAIR HFA 108 (90 BASE) MCG/ACT inhaler Inhale 2 puffs into the lungs 3 (three) times daily. 04/15/15  Yes Historical Provider, MD  senna-docusate (SENEXON-S) 8.6-50 MG tablet Take 1 tablet by mouth 2 (two) times daily.   Yes Historical Provider, MD  traZODone (DESYREL) 50 MG tablet Take 25 mg by mouth at bedtime as needed for sleep.   Yes Historical Provider, MD   Physical Exam: Filed Vitals:   04/26/15 1124 04/26/15 1200 04/26/15 1240  BP: 157/81  125/67  Pulse: 126  119  Temp: 98.4 F (36.9 C)  98.4 F (36.9 C)  TempSrc: Oral  Oral  Resp: 26  23  Height:  (1.626 m)    Weight: 57.153 kg (126 lb)    SpO2: 99% 96% 96%    General:  Appears calm and comfortable Eyes: Right pupil eccentric and non-reactive. Left is unremarkable.  ENT: grossly normal hearing, lips & tongue Neck: no LAD, masses or thyromegaly Cardiovascular: Irregular rhythm and tachycardiac with no m/r/g. No LE edema. Respiratory: Diminished breath sounds on right, clear on left. Mild increased respiratory efforts. No w/r/r.  Abdomen: soft, ntnd. No RUQ pain. Skin: no rash or induration noted Musculoskeletal: grossly normal tone BUE/BLE; moves  all extremities to command Psychiatric: grossly normal mood and affect, speech fluent and appropriate Neurologic: grossly non-focal.  Wt Readings from Last 3 Encounters:  04/26/15 57.153 kg (126 lb)  04/13/15 64 kg (141 lb 1.5 oz)  11/04/14 65.599 kg (144 lb 9.9 oz)    Labs on Admission:  Basic Metabolic Panel:  Recent Labs Lab 04/26/15 1139  NA 133*  K 4.6  CL 98*  CO2 25  GLUCOSE 97  BUN 33*  CREATININE 0.76  CALCIUM 8.8*    Liver Function Tests:  Recent Labs Lab 04/26/15 1139  AST 413*  ALT 223*  ALKPHOS 102  BILITOT 1.6*  PROT 6.7  ALBUMIN 3.1*    CBC:  Recent Labs  Lab 04/26/15 1139  WBC 14.6*  HGB 10.5*  HCT 33.6*  MCV 103.1*  PLT 245    Radiological Exams on Admission: Dg Chest 1 View  04/26/2015  CLINICAL DATA:  SOB, pt had to be propped up to get semi erect film on stretcher, unable to cooperate or move EXAM: CHEST  1 VIEW COMPARISON:  04/12/2015 FINDINGS: Moderate right pleural effusion, increased from previous exam. New suprahilar airspace consolidation. Coarse interstitial opacities throughout both lungs as before. Heart size upper limits normal for technique. Atheromatous aorta. Pneumothorax. IMPRESSION: 1. Worsening right suprahilar airspace disease and progressive moderate right pleural effusion. Electronically Signed   By: Corlis Leak M.D.   On: 04/26/2015 12:44      Principal Problem:   HCAP (healthcare-associated pneumonia) Active Problems:   FIBRILLATION, ATRIAL   Sepsis (HCC)   Anemia of chronic disease   Pleural effusion on right   Hyponatremia   Assessment/Plan 1. HCAP, suspected early sepsis, without hypoxia. Lactic acid elevated at 2.8. 2. Chronic atrial fibrillation with mild RVR. 3. Elevated transaminases, 10x normal. No RUQ. No history to suggest gallbladder pathology. likey reactive to pleural effusion 4. Right pleural effusion. Respiratory status stable, no hypoxia. Somewhat larger than last CXR. 5. Stable macrocytic  anemia, anemia of chronic disease. 6. Mild hyponatremia   Appears stable but with elevated HR and tachypnea will admit to the SDU.  Empiric abx for HCAP. Trend lactic acid. IVF.  Oral meds for afib; may need IV diltiazem  Repeat CXR in AM, consider thoracentesis 10/31   Code Status: DNR  DVT prophylaxis:SCDs  Family Communication: Son and daughter bedside Disposition Plan/Anticipated LOS: Admitted to SDU for 1 day. Then transfer to medical bed for 1-2 days.    Time spent: 25 minutes  Brendia Sacks, MD  [name] Pager [phone] 04/26/2015, 12:50 PM    By signing my name below, I, [name] attest that this documentation has been prepared under the direction and in the presence of Brendia Sacks, MD Electronically signed: [name]  04/26/2015 2:19pm

## 2015-04-26 NOTE — Progress Notes (Signed)
Patient completed 2000ml NS bolus. Increased respiration noted. Patient anxious at time. Notified Dr. Irene LimboGoodrich regarding respiration and given Vital signs post bolus given. Dr. Irene LimboGoodrich in to see patient.

## 2015-04-26 NOTE — ED Notes (Addendum)
EMS called out for respiratory distress. Per EMS altered mental status, short of breath but in NAD.  [facility] of [address] [facility].

## 2015-04-26 NOTE — Progress Notes (Signed)
Informed Dr. Irene LimboGoodrich Lab attempt to draw blood cultures twice. Patient refused to have labs drawn again anymore.

## 2015-04-26 NOTE — ED Provider Notes (Signed)
CSN: [id number]     Arrival date & time 04/26/15  1119 History   First MD Initiated Contact with Patient 04/26/15 1154     Chief Complaint  Patient presents with  . Altered Mental Status  . Shortness of Breath      HPI Pt was seen at 1205. Per pt and her family, c/o gradual onset and worsening of persistent SOB for the past 1 week. Has been associated with generalized weakness/fatigue, coughing, and "wheezing." Denies fevers, no CP/palpitations, no abd pain, no N/V/D, no back pain, no focal motor weakness.    Past Medical History  Diagnosis Date  . Atrial fibrillation (HCC)   . Osteoporosis     Knee; hip  . Wrist fracture 1988    Left  . Detached retina, right   . Chronic anticoagulation   . Compression fracture of thoracic spine, non-traumatic (HCC)   . Coronary artery disease   . DNR (do not resuscitate)    Past Surgical History  Procedure Laterality Date  . Sp kyphoplasty      Percutaneous  . Retinal detachment surgery    . Cataract extraction      Bilateral  . Total knee arthroplasty  2006    Left  . Breast excisional biopsy  1954; 1968    Benign disease  . Orif wrist fracture  1988  . Colonoscopy  2000  . Back surgery    . Femur im nail Left 10/25/2013    Procedure: INTRAMEDULLARY (IM) NAIL FEMORAL WITH DALL MILES CABLE TO LEFT FEMUR;  Surgeon: Vickki HearingStanley E Harrison, MD;  Location: [facility];  Service: Orthopedics;  Laterality: Left;  BIOMET    Family History  Problem Relation Age of Onset  . Heart disease Father     deceased age 79  . Heart attack Mother   . Arthritis     Social History  Substance Use Topics  . Smoking status: Never Smoker   . Smokeless tobacco: Never Used  . Alcohol Use: No    Review of Systems ROS: Statement: All systems negative except as marked or noted in the HPI; Constitutional: Negative for fever and chills. +generalized weakness.; ; Eyes: Negative for eye pain, redness and discharge. ; ; ENMT: Negative for ear pain, hoarseness, nasal  congestion, sinus pressure and sore throat. ; ; Cardiovascular: Negative for chest pain, palpitations, diaphoresis, and peripheral edema. ; ; Respiratory: +cough, SOB, wheezing. Negative for stridor. ; ; Gastrointestinal: Negative for nausea, vomiting, diarrhea, abdominal pain, blood in stool, hematemesis, jaundice and rectal bleeding. . ; ; Genitourinary: Negative for dysuria, flank pain and hematuria. ; ; Musculoskeletal: Negative for back pain and neck pain. Negative for swelling and trauma.; ; Skin: Negative for pruritus, rash, abrasions, blisters, bruising and skin lesion.; ; Neuro: Negative for headache, lightheadedness and neck stiffness. Negative for altered level of consciousness , altered mental status, extremity weakness, paresthesias, involuntary movement, seizure and syncope.      Allergies  Nitrofurantoin  Home Medications   Prior to Admission medications   Medication Sig Start Date End Date Taking? Authorizing Provider  acetaminophen (TYLENOL) 500 MG tablet Take 500 mg by mouth daily.   Yes Historical Provider, MD  ARTIFICIAL TEAR OP Place 1 drop into both eyes 2 (two) times daily.   Yes Historical Provider, MD  Ascorbic Acid (VITAMIN C) 500 MG tablet Take 500 mg by mouth daily.     Yes Historical Provider, MD  Calcium Carbonate (CALTRATE 600 PO) Take 1 capsule by mouth every morning.  Yes Historical Provider, MD  carvedilol (COREG) 3.125 MG tablet Take 3.125 mg by mouth 2 (two) times daily with a meal.     Yes Historical Provider, MD  cetirizine (ZYRTEC) 10 MG tablet Take 10 mg by mouth daily.   Yes Historical Provider, MD  docusate sodium 100 MG CAPS Take 100 mg by mouth 2 (two) times daily. Patient taking differently: Take 200 mg by mouth at bedtime.  10/29/13  Yes Christiane Ha, MD  ENSURE (ENSURE) Take 237 mLs by mouth 2 (two) times daily. Strawberry flavored   Yes Historical Provider, MD  fluticasone (FLONASE) 50 MCG/ACT nasal spray Place 1 spray into both nostrils 2  (two) times daily.    Yes Historical Provider, MD  gabapentin (NEURONTIN) 100 MG capsule Take 2 capsules (200 mg total) by mouth 3 (three) times daily. 10/29/13  Yes Christiane Ha, MD  HYDROcodone-acetaminophen (NORCO/VICODIN) 5-325 MG per tablet Take 1-2 tablets by mouth every 6 (six) hours as needed for moderate pain. Patient taking differently: Take 1 tablet by mouth 2 (two) times daily.  01/08/14  Yes Mahima Pandey, MD  levofloxacin (LEVAQUIN) 500 MG tablet Take 1 tablet by mouth daily. For 7 days, started on 10/27 04/23/15  Yes Historical Provider, MD  levothyroxine (SYNTHROID, LEVOTHROID) 50 MCG tablet Take 50 mcg by mouth daily before breakfast.   Yes Historical Provider, MD  Multiple Vitamins-Minerals (MULTIVITAMIN WITH MINERALS) tablet Take 1 tablet by mouth every morning.    Yes Historical Provider, MD  Olopatadine HCl (PATADAY) 0.2 % SOLN Apply 1 drop to eye daily.   Yes Historical Provider, MD  ondansetron (ZOFRAN) 4 MG tablet Take 1 tablet (4 mg total) by mouth every 6 (six) hours as needed for nausea. 10/29/13  Yes Christiane Ha, MD  PROAIR HFA 108 (90 BASE) MCG/ACT inhaler Inhale 2 puffs into the lungs 3 (three) times daily. 04/15/15  Yes Historical Provider, MD  senna-docusate (SENEXON-S) 8.6-50 MG tablet Take 1 tablet by mouth 2 (two) times daily.   Yes Historical Provider, MD  traZODone (DESYREL) 50 MG tablet Take 25 mg by mouth at bedtime as needed for sleep.   Yes Historical Provider, MD   BP 128/74 mmHg  Pulse 119  Temp(Src) 98.8 F (37.1 C) (Rectal)  Resp 23  Ht  (1.626 m)  Wt 126 lb (57.153 kg)  BMI 21.62 kg/m2  SpO2 96% Physical Exam 1210: Physical examination:  Nursing notes reviewed; Vital signs and O2 SAT reviewed;  Constitutional: Well developed, Well nourished, In no acute distress; Head:  Normocephalic, atraumatic; Eyes: EOMI, PERRL, No scleral icterus; ENMT: Mouth and pharynx normal, Mucous membranes dry; Neck: Supple, Full range of motion, No  lymphadenopathy; Cardiovascular: Tachycardic rate and rhythm, No gallop; Respiratory: Breath sounds coarse & equal bilaterally, No wheezes.  Speaking full sentences with ease, Normal respiratory effort/excursion; Chest: Nontender, Movement normal; Abdomen: Soft, Nontender, Nondistended, Normal bowel sounds; Genitourinary: No CVA tenderness; Extremities: Pulses normal, No tenderness, No edema, No calf edema or asymmetry.; Neuro: AA&Ox3, Major CN grossly intact. No facial droop. Speech clear. Moves all extremities on stretcher spontaneously without apparent focal motor deficit.; Skin: Color normal, Warm, Dry.   ED Course  Procedures (including critical care time) Labs Review   Imaging Review  I have personally reviewed and evaluated these images and lab results as part of my medical decision-making.   EKG Interpretation   Date/Time:  Sunday April 26 2015 13:44:59 EDT Ventricular Rate:  115 PR Interval:    QRS Duration:  74 QT Interval:  296 QTC Calculation: 409 R Axis:   4 Text Interpretation:  Atrial fibrillation with rapid ventricular response  Low voltage QRS Nonspecific ST and T wave abnormality When compared with  ECG of 04/12/2015 No significant change was found Confirmed by [facility]   MD, Nicholos Johns [phone]) on 04/26/2015 1:53:08 PM      MDM  MDM Reviewed: previous chart, nursing note and vitals Reviewed previous: labs and ECG Interpretation: labs, ECG and x-ray   Results for orders placed or performed during the hospital encounter of 04/26/15  Comprehensive metabolic panel  Result Value Ref Range   Sodium 133 (L) 135 - 145 mmol/L   Potassium 4.6 3.5 - 5.1 mmol/L   Chloride 98 (L) 101 - 111 mmol/L   CO2 25 22 - 32 mmol/L   Glucose, Bld 97 65 - 99 mg/dL   BUN 33 (H) 6 - 20 mg/dL   Creatinine, Ser 6.21 0.44 - 1.00 mg/dL   Calcium 8.8 (L) 8.9 - 10.3 mg/dL   Total Protein 6.7 6.5 - 8.1 g/dL   Albumin 3.1 (L) 3.5 - 5.0 g/dL   AST 308 (H) 15 - 41 U/L   ALT 223 (H) 14 -  54 U/L   Alkaline Phosphatase 102 38 - 126 U/L   Total Bilirubin 1.6 (H) 0.3 - 1.2 mg/dL   GFR calc non Af Amer >60 >60 mL/min   GFR calc Af Amer >60 >60 mL/min   Anion gap 10 5 - 15  CBC  Result Value Ref Range   WBC 14.6 (H) 4.0 - 10.5 K/uL   RBC 3.26 (L) 3.87 - 5.11 MIL/uL   Hemoglobin 10.5 (L) 12.0 - 15.0 g/dL   HCT 65.7 (L) 84.6 - 96.2 %   MCV 103.1 (H) 78.0 - 100.0 fL   MCH 32.2 26.0 - 34.0 pg   MCHC 31.3 30.0 - 36.0 g/dL   RDW 95.2 (H) 84.1 - 32.4 %   Platelets 245 150 - 400 K/uL  Troponin I  Result Value Ref Range   Troponin I 0.03 <0.031 ng/mL  Brain natriuretic peptide  Result Value Ref Range   B Natriuretic Peptide 547.0 (H) 0.0 - 100.0 pg/mL  Lactic acid, plasma  Result Value Ref Range   Lactic Acid, Venous 2.8 (HH) 0.5 - 2.0 mmol/L  Urinalysis, Routine w reflex microscopic  Result Value Ref Range   Color, Urine YELLOW YELLOW   APPearance CLEAR CLEAR   Specific Gravity, Urine >1.030 (H) 1.005 - 1.030   pH 5.5 5.0 - 8.0   Glucose, UA NEGATIVE NEGATIVE mg/dL   Hgb urine dipstick SMALL (A) NEGATIVE   Bilirubin Urine NEGATIVE NEGATIVE   Ketones, ur NEGATIVE NEGATIVE mg/dL   Protein, ur 30 (A) NEGATIVE mg/dL   Urobilinogen, UA 0.2 0.0 - 1.0 mg/dL   Nitrite NEGATIVE NEGATIVE   Leukocytes, UA NEGATIVE NEGATIVE  Urine microscopic-add on  Result Value Ref Range   Squamous Epithelial / LPF FEW (A) RARE   RBC / HPF 3-6 <3 RBC/hpf   Bacteria, UA FEW (A) RARE   Dg Chest 1 View 04/26/2015  CLINICAL DATA:  SOB, pt had to be propped up to get semi erect film on stretcher, unable to cooperate or move EXAM: CHEST  1 VIEW COMPARISON:  04/12/2015 FINDINGS: Moderate right pleural effusion, increased from previous exam. New suprahilar airspace consolidation. Coarse interstitial opacities throughout both lungs as before. Heart size upper limits normal for technique. Atheromatous aorta. Pneumothorax. IMPRESSION: 1. Worsening right suprahilar airspace  disease and progressive  moderate right pleural effusion. Electronically Signed   By: Corlis Leak M.D.   On: 04/26/2015 12:44   Results for ALISABETH, SELKIRK (MRN [id number]) as of 04/26/2015 13:36  Ref. Range 04/18/2014 14:59 04/19/2014 05:45 04/26/2015 11:39  AST Latest Ref Range: 15-41 U/L 22 21 413 (H)  ALT Latest Ref Range: 14-54 U/L 11 11 223 (H)  Total Bilirubin Latest Ref Range: 0.3-1.2 mg/dL 1.1 0.7 1.6 (H)   Results for LASHE, OLIVEIRA (MRN [id number]) as of 04/26/2015 13:36  Ref. Range 04/12/2015 20:20 04/26/2015 11:39  B Natriuretic Peptide Latest Ref Range: 0.0-100.0 pg/mL 473.0 (H) 547.0 (H)    1350:  Pt unable to stand for orthostatic VS. ED RN gave short neb before my arrival to exam room for faint wheezing; no wheezing during my exam. IV abx started for HCAP. H/H per baseline. LFT's newly elevated today, no RUQ tenderness on exam. Dx and testing d/w pt and family.  Questions answered.  Verb understanding, agreeable to admit.  T/C to [facility]. Irene Limbo, case discussed, including:  HPI, pertinent PM/SHx, VS/PE, dx testing, ED course and treatment:  Agreeable to admit, requests to write temporary orders, obtain medical bed to team [facility].     Samuel Jester, DO 04/29/15 1400

## 2015-04-27 ENCOUNTER — Observation Stay (HOSPITAL_COMMUNITY): Payer: Medicare Other

## 2015-04-27 ENCOUNTER — Inpatient Hospital Stay (HOSPITAL_COMMUNITY): Payer: Medicare Other

## 2015-04-27 LAB — BODY FLUID CELL COUNT WITH DIFFERENTIAL
EOS FL: 0 %
LYMPHS FL: 65 %
MONOCYTE-MACROPHAGE-SEROUS FLUID: 4 % — AB (ref 50–90)
Neutrophil Count, Fluid: 31 % — ABNORMAL HIGH (ref 0–25)
Total Nucleated Cell Count, Fluid: 742 cu mm (ref 0–1000)

## 2015-04-27 LAB — URINE CULTURE: Culture: NO GROWTH

## 2015-04-27 LAB — CBC
HEMATOCRIT: 33.9 % — AB (ref 36.0–46.0)
Hemoglobin: 10.4 g/dL — ABNORMAL LOW (ref 12.0–15.0)
MCH: 31.3 pg (ref 26.0–34.0)
MCHC: 30.7 g/dL (ref 30.0–36.0)
MCV: 102.1 fL — AB (ref 78.0–100.0)
Platelets: 212 10*3/uL (ref 150–400)
RBC: 3.32 MIL/uL — ABNORMAL LOW (ref 3.87–5.11)
RDW: 22.1 % — AB (ref 11.5–15.5)
WBC: 15.9 10*3/uL — ABNORMAL HIGH (ref 4.0–10.5)

## 2015-04-27 LAB — COMPREHENSIVE METABOLIC PANEL
ALBUMIN: 2.7 g/dL — AB (ref 3.5–5.0)
ALT: 241 U/L — AB (ref 14–54)
AST: 377 U/L — AB (ref 15–41)
Alkaline Phosphatase: 102 U/L (ref 38–126)
Anion gap: 6 (ref 5–15)
BUN: 31 mg/dL — AB (ref 6–20)
CHLORIDE: 104 mmol/L (ref 101–111)
CO2: 24 mmol/L (ref 22–32)
Calcium: 8.1 mg/dL — ABNORMAL LOW (ref 8.9–10.3)
Creatinine, Ser: 0.81 mg/dL (ref 0.44–1.00)
GFR calc Af Amer: >60 mL/min (ref >60)
GFR calc non Af Amer: 60 mL/min — ABNORMAL LOW (ref >60)
GLUCOSE: 106 mg/dL — AB (ref 65–99)
POTASSIUM: 4.5 mmol/L (ref 3.5–5.1)
Sodium: 134 mmol/L — ABNORMAL LOW (ref 135–145)
Total Bilirubin: 1.5 mg/dL — ABNORMAL HIGH (ref 0.3–1.2)
Total Protein: 5.8 g/dL — ABNORMAL LOW (ref 6.5–8.1)

## 2015-04-27 LAB — GRAM STAIN

## 2015-04-27 LAB — MRSA PCR SCREENING: MRSA by PCR: NEGATIVE

## 2015-04-27 MED ORDER — ALBUTEROL SULFATE (2.5 MG/3ML) 0.083% IN NEBU: 2.5000 mg | INHALATION_SOLUTION | RESPIRATORY_TRACT | Status: DC | PRN

## 2015-04-27 MED ORDER — METHYLPREDNISOLONE SODIUM SUCC 40 MG IJ SOLR
40.0000 mg | Freq: Two times a day (BID) | INTRAMUSCULAR | Status: DC
  Administered 2015-04-27 – 2015-04-28 (×3): 40 mg via INTRAVENOUS
  Filled 2015-04-27 (×3): qty 1

## 2015-04-27 NOTE — Progress Notes (Signed)
PROGRESS NOTE  Kim [name] [id number] DOB: Mar 01, 1918 DOA: 04/26/2015 PCP: Dwana MelenaZack Hall, MD  Summary: [age] yow PMH chronic afib and CAD presented with SOB and wheezing. While in the ED CXR revealed pneumonia and labs with mild elevation of lactic acid. Admitted for treatment of HCAP.  Assessment/Plan: 1. HCAP,  sepsis, with mild hypoxia and increased work in breathing.  2. Right pleural effusion. Respiratory status stable. Suspect parapneumonic effusion.  3. Chronic atrial fibrillation with mild intermittent RVR. 4. Elevated transaminases, 5x normal. No RUQ pain. No history to suggest gallbladder pathology. likey reactive to pleural effusion. 5. Stable macrocytic anemia, anemia of chronic disease. 6. Mild hyponatremia   Appears about the same, mild tachypnea. She feels better and looks stable though her CXR appears worse.   Continue broad spectrum abx  Check RUQ ultrasound   Thoracentesis 10/31  CMP and CBC in the morning.   Code Status: DNR  DVT prophylaxis: SCDs Family Communication: No family bedside Disposition Plan: Anticipate discharge within   Brendia Sacksaniel Annaclaire Walsworth, MD  [name]  Pager [phone] If 7PM-7AM, please contact night-coverage at [email or web address], password [email or web address] 04/27/2015, 6:29 AM  LOS: 1 day   Consultants:   Procedures:    Antibiotics:  Vancomycin 10/30>>  Ceftazidime 10/30 >>  HPI/Subjective: Feels good, was able to sleep without difficulty. Denies pain, coughing, nausea, vomiting, or coughing.   Objective: Filed Vitals:   04/26/15 2035 04/26/15 2205 04/27/15 0000 04/27/15 0400  BP:      Pulse: 102 111    Temp: 97.8 F (36.6 C)  97.9 F (36.6 C) 98.6 F (37 C)  TempSrc: Oral  Oral Oral  Resp: 36     Height:      Weight:    67.7 kg (149 lb 4 oz)  SpO2: 96%       Intake/Output Summary (Last 24 hours) at 04/27/15 0629 Last data filed at 04/26/15 1736  Gross per 24 hour  Intake   2000 ml  Output     20 ml  Net   1980 ml      Filed Weights   04/26/15 1124 04/27/15 0400  Weight: 57.153 kg (126 lb) 67.7 kg (149 lb 4 oz)    Exam: Afebrile, VSS, not hypoxic on [place].  General:  Appears calm and comfortable. Lying in bed. Eyes: PERRL, normal lids, irises & conjunctiva ENT: grossly normal hearing, lips & tongue Cardiovascular: irregular rhythm, and tachycardic, no m/r/g. No LE edema. Telemetry: AFib  Respiratory: Left is clear and normal on right. Decreased breath sounds. Mild to moderate increased respiratory effort with abdominal breathing and mild supraclavicular retractions: Abdomen: soft, nt nd. No RUQ pain. Musculoskeletal: grossly normal tone BUE/BLE Psychiatric: grossly normal mood and affect, speech fluent and appropriate Neurologic: grossly non-focal.  New data reviewed:  BMP unremarkable BUN 31, Creatinine .81  Transaminases without significant change.   WBC 15.9  Hgb stable 10.4  Independently reviewed: repeat CXR worsen infiltrate right mid and upper lobe pleural effusion looks the same.   Pertinent data since admission:  Lactic acid 2.9  CXR revealed worsening right suprahilar airspace disease and progressive moderate right pleural effusion.   Pending data:    Scheduled Meds: . carvedilol  3.125 mg Oral BID WC  . cefTAZidime (FORTAZ)  IV  2 g Intravenous Q12H  . enoxaparin (LOVENOX) injection  40 mg Subcutaneous Q24H  . fluticasone  1 spray Each Nare BID  . gabapentin  200 mg Oral TID  . levothyroxine  50 mcg Oral  QAC breakfast  . loratadine  10 mg Oral Daily  . sodium chloride  3 mL Intravenous Q12H  . sodium chloride  3 mL Intravenous Q12H  . vancomycin  750 mg Intravenous Q24H   Continuous Infusions:   Principal Problem:   HCAP (healthcare-associated pneumonia) Active Problems:   FIBRILLATION, ATRIAL   Sepsis (HCC)   Anemia of chronic disease   Pleural effusion on right   Hyponatremia   Time spent 25 minutes   By signing my name below, I, Kim [name]  attest that this documentation has been prepared under the direction and in the presence of Brendia Sacks, MD Electronically signed: Zadie [name]  04/27/2015 8:16am  I personally performed the services described in this documentation. All medical record entries made by the scribe were at my direction. I have reviewed the chart and agree that the record reflects my personal performance and is accurate and complete. Brendia Sacks, MD

## 2015-04-27 NOTE — Procedures (Signed)
PreOperative Dx: RT pleural effusion Postoperative Dx: RT pleural effusion Procedure:   US guided RT thoracentesis Radiologist:  Tyron RussellBoles Anesthesia:  10 ml of 1 lidocaine Specimen:  1000 ml of amber colored fluid EBL:   < 1 ml Complications: None

## 2015-04-28 ENCOUNTER — Inpatient Hospital Stay (HOSPITAL_COMMUNITY): Payer: Medicare Other

## 2015-04-28 DIAGNOSIS — D638 Anemia in other chronic diseases classified elsewhere: Secondary | ICD-10-CM

## 2015-04-28 DIAGNOSIS — I4891 Unspecified atrial fibrillation: Secondary | ICD-10-CM

## 2015-04-28 LAB — COMPREHENSIVE METABOLIC PANEL
ALBUMIN: 2.5 g/dL — AB (ref 3.5–5.0)
ALK PHOS: 93 U/L (ref 38–126)
ALT: 190 U/L — ABNORMAL HIGH (ref 14–54)
ANION GAP: 7 (ref 5–15)
AST: 199 U/L — AB (ref 15–41)
BUN: 32 mg/dL — ABNORMAL HIGH (ref 6–20)
CALCIUM: 8.3 mg/dL — AB (ref 8.9–10.3)
CHLORIDE: 103 mmol/L (ref 101–111)
CO2: 25 mmol/L (ref 22–32)
Creatinine, Ser: 0.82 mg/dL (ref 0.44–1.00)
GFR calc non Af Amer: 59 mL/min — ABNORMAL LOW (ref >60)
Glucose, Bld: 119 mg/dL — ABNORMAL HIGH (ref 65–99)
POTASSIUM: 4.2 mmol/L (ref 3.5–5.1)
SODIUM: 135 mmol/L (ref 135–145)
Total Bilirubin: 1.4 mg/dL — ABNORMAL HIGH (ref 0.3–1.2)
Total Protein: 5.8 g/dL — ABNORMAL LOW (ref 6.5–8.1)

## 2015-04-28 LAB — CBC
HCT: 36.8 % (ref 36.0–46.0)
HEMOGLOBIN: 11 g/dL — AB (ref 12.0–15.0)
MCH: 30.7 pg (ref 26.0–34.0)
MCHC: 29.9 g/dL — ABNORMAL LOW (ref 30.0–36.0)
MCV: 102.8 fL — ABNORMAL HIGH (ref 78.0–100.0)
Platelets: 201 10*3/uL (ref 150–400)
RBC: 3.58 MIL/uL — AB (ref 3.87–5.11)
RDW: 22.1 % — ABNORMAL HIGH (ref 11.5–15.5)
WBC: 10 10*3/uL (ref 4.0–10.5)

## 2015-04-28 LAB — PATHOLOGIST SMEAR REVIEW

## 2015-04-28 LAB — AMYLASE, PLEURAL FLUID: Amylase, Pleural Fluid: 5 U/L

## 2015-04-28 MED ORDER — PREDNISONE 20 MG PO TABS
40.0000 mg | ORAL_TABLET | Freq: Every day | ORAL | Status: DC
  Administered 2015-04-29: 40 mg via ORAL
  Filled 2015-04-28: qty 2

## 2015-04-28 MED ORDER — CARVEDILOL 3.125 MG PO TABS
6.2500 mg | ORAL_TABLET | Freq: Two times a day (BID) | ORAL | Status: DC
  Administered 2015-04-28 – 2015-04-29 (×2): 6.25 mg via ORAL
  Filled 2015-04-28 (×2): qty 2

## 2015-04-28 NOTE — Clinical Social Work Note (Signed)
Clinical Social Work Assessment  Patient Details  Name: Kim EldersMary M Vandemark MRN: [id number] Date of Birth: [birth year]  Date of referral:  04/28/15               Reason for consult:  Facility Placement                Permission sought to share information with:    Permission granted to share information::     Name::        Agency::     Relationship::     Contact Information:     Housing/Transportation Living arrangements for the past 2 months:  Assisted Living Facility Source of Information:  Adult Children Patient Interpreter Needed:  None Criminal Activity/Legal Involvement Pertinent to Current Situation/Hospitalization:  No - Comment as needed Significant Relationships:  Adult Children Lives with:  Facility Resident Do you feel safe going back to the place where you live?  Yes Need for family participation in patient care:  Yes (Comment)  Care giving concerns:  Pt is long term resident at ALF.    Social Worker assessment / plan:  CSW spoke with RN regarding pt. Pt continues to have altered mental status. CSW called son, Kim [name] who is involved and supportive. [facility] for about a year and a half. Lewis indicates that she considers this "home" now and he would like for pt to return at d/c. PT evaluation pending for recommendations. Pt was in hospital about 2 weeks ago. Per Kim [name] at facility, pt returned much weaker and has been in home health PT/RN in house. She is an extensive assist with transfers, dressing, bathing, and toileting. Pt is not ambulatory due to severe arthritis in knees. Okay for return.   Employment status:  Retired Health and safety inspectornsurance information:  Medicare PT Recommendations:  Not assessed at this time Information / Referral to community resources:  Other (Comment Required) (return [facility])  Patient/Family's Response to care:  Pt's son is hopeful that pt will be [facility] when [facility].    Patient/Family's Understanding of and Emotional Response to Diagnosis, Current Treatment, and Prognosis:  Pt's son is aware of admission diagnosis and reports he will be at hospital for update soon.   Emotional Assessment Appearance:  Appears stated age Attitude/Demeanor/Rapport:  Unable to Assess Affect (typically observed):  Unable to Assess Orientation:    Alcohol / Substance use:  Not Applicable Psych involvement (Current and /or in the community):  No (Comment)  Discharge Needs  Concerns to be addressed:  Discharge Planning Concerns Readmission within the last 30 days:  Yes Current discharge risk:  Physical Impairment Barriers to Discharge:  Continued Medical Work up   Kim [name], Kim Rudy Shanaberger, LCSW 04/28/2015, 10:53 AM [phone]

## 2015-04-28 NOTE — Evaluation (Signed)
Physical Therapy Evaluation Patient Details Name: Kim [name] MRN: [id number] DOB: [birth year] Today's Date: 04/28/2015   History of Present Illness  89 yow PMH chronic afib and CAD presented with SOB and wheezing. While in the ED CXR revealed pneumonia and labs with mild elevation of lactic acid. Admitted for treatment of HCAP. Of note, Discharged 10/18, treated for UTI with sepsis at that time, URI without evidence of pneumonia or hypoxia.  Clinical Impression   Pt was seen for evaluation.  She is a resident of [facility] and is non ambulatory, requires a 1-2 person assist to transfer bed to chair.  Pt was drowsy but able to awaken for me.  She was very oriented and cooperative, very hungry as she had not eaten since yesterday.  She has significant bilateral knee flexion contractures of at least 30*.  She required max assist to transfer from supine to chair.  Her rehab potential is poor but she has been receiving HHPT and would like to resume that at d/c.  No acute care PT will be indicated.    Follow Up Recommendations Home health PT    Equipment Recommendations  None recommended by PT    Recommendations for Other Services   none    Precautions / Restrictions Precautions Precautions: Fall Restrictions Weight Bearing Restrictions: No      Mobility  Bed Mobility Overal bed mobility: Needs Assistance Bed Mobility: Supine to Sit     Supine to sit: Max assist        Transfers Overall transfer level: Needs assistance Equipment used: None Transfers: Stand Pivot Transfers   Stand pivot transfers: Max assist       General transfer comment: pt is able to hug therapist during transfer but is unable to functionally able to bear weight on LEs  Ambulation/Gait             General Gait Details: not ambulatory  Stairs            Wheelchair Mobility    Modified Rankin (Stroke Patients Only)       Balance Overall balance assessment:  (sitting balance is good)                                            Pertinent Vitals/Pain Pain Assessment: No/denies pain    Home Living Family/patient expects to be discharged to:: Assisted living               Home Equipment: Wheelchair - manual      Prior Function Level of Independence: Needs assistance   Gait / Transfers Assistance Needed: non ambulatory, needs 1-2 person assist with transfers  ADL's / Homemaking Assistance Needed: assist with all ADLs but is normally able to feed herself        Hand Dominance   Dominant Hand: Right    Extremity/Trunk Assessment               Lower Extremity Assessment: Generalized weakness (bilateral knee flexion contractures of about 30* )      Cervical / Trunk Assessment: Kyphotic  Communication   Communication: No difficulties  Cognition Arousal/Alertness: Lethargic Behavior During Therapy: WFL for tasks assessed/performed Overall Cognitive Status: Within Functional Limits for tasks assessed                      General Comments  Exercises        Assessment/Plan    PT Assessment All further PT needs can be met in the next venue of care  PT Diagnosis Generalized weakness   PT Problem List Decreased strength;Decreased range of motion;Decreased activity tolerance;Decreased mobility  PT Treatment Interventions     PT Goals (Current goals can be found in the Care Plan section) Acute Rehab PT Goals PT Goal Formulation: All assessment and education complete, DC therapy    Frequency     Barriers to discharge        Co-evaluation               End of Session Equipment Utilized During Treatment: Gait belt Activity Tolerance: Patient limited by fatigue Patient left: in chair;with call bell/phone within reach;with nursing/sitter in room Nurse Communication: Mobility status         Time: 2767-0110 PT Time Calculation (min) (ACUTE ONLY): 27 min   Charges:   PT Evaluation $Initial PT  Evaluation Tier I: 1 Procedure     PT G CodesDemetrios Isaacs L  PT 04/28/2015, 12:28 PM [phone]

## 2015-04-28 NOTE — [place] FL2 (Signed)
[place] MEDICAID FL2 LEVEL OF CARE SCREENING TOOL     IDENTIFICATION  Patient Name: Kim [name] Birthdate: 01/06/1918 Sex: female Admission Date (Current Location): 04/26/2015  [facility] and [place] Number: [employer] and Address:  [facility],  [address], [place] [address]      Provider Number: [id number]  Attending Physician Name and Address:  Standley Brooking, MD  Relative Name and Phone Number:       Current Level of Care: Hospital Recommended Level of Care: Assisted Living Facility Prior Approval Number:    Date Approved/Denied:   PASRR Number:    Discharge Plan: Other (Comment) (ALF)    Current Diagnoses: Patient Active Problem List   Diagnosis Date Noted  . HCAP (healthcare-associated pneumonia) 04/26/2015  . Pleural effusion on right 04/26/2015  . Hyponatremia 04/26/2015  . Sepsis (HCC) 04/13/2015  . Anemia of chronic disease 04/13/2015  . UTI (urinary tract infection) 04/12/2015  . URI, acute 04/12/2015  . Cellulitis of left lower extremity 04/18/2014  . Cellulitis and abscess of leg, except foot 04/18/2014  . Femur fracture (HCC) 02/04/2014  . Unspecified constipation 02/03/2014  . Unspecified hereditary and idiopathic peripheral neuropathy 02/03/2014  . S/p left hip fracture 11/12/2013  . Herniated disc 11/12/2013  . Pressure ulcer, heel(707.07) 10/30/2013  . Right leg weakness 10/30/2013  . Acute blood loss anemia 10/26/2013  . Thrombocytopenia, unspecified (HCC) 10/26/2013  . Right hip pain 10/26/2013  . Subtrochanteric fracture of left femur (HCC) 10/25/2013  . Normocytic anemia 10/23/2013  . Femur fracture, left (HCC) 10/22/2013  . Fracture of hip, left, closed (HCC) 10/22/2013  . Knee pain, right 02/01/2012  . Unsteady gait 10/14/2010  . FIBRILLATION, ATRIAL 08/18/2006  . DETACHMENT, RETINAL NOS 05/22/2006  . OSTEOPOROSIS 05/22/2006  . Fracture, thoracic vertebra, compression (HCC) 05/22/2006     Orientation ACTIVITIES/SOCIAL BLADDER RESPIRATION    Self, Place  Family supportive Incontinent O2 (As needed) (2L)  BEHAVIORAL SYMPTOMS/MOOD NEUROLOGICAL BOWEL NUTRITION STATUS      Incontinent Diet (Heart healthy)  PHYSICIAN VISITS COMMUNICATION OF NEEDS Height & Weight Skin    Verbally  (162.6 cm) 145 lbs. Other (Comment) (cracking to feet)          AMBULATORY STATUS RESPIRATION     (not ambulatory) O2 (As needed) (2L)      Personal Care Assistance Level of Assistance  Bathing, Dressing Bathing Assistance: Maximum assistance Feeding assistance: Limited assistance Dressing Assistance: Maximum assistance      Functional Limitations Info                SPECIAL CARE FACTORS FREQUENCY                      Additional Factors Info  Code Status, Allergies Code Status Info: DNR Allergies Info: Nitrofurantoin           Current Medications (04/28/2015): Current Facility-Administered Medications  Medication Dose Route Frequency Provider Last Rate Last Dose  . 0.9 %  sodium chloride infusion  250 mL Intravenous PRN Standley Brooking, MD      . acetaminophen (TYLENOL) tablet 650 mg  650 mg Oral Q6H PRN Standley Brooking, MD       Or  . acetaminophen (TYLENOL) suppository 650 mg  650 mg Rectal Q6H PRN Standley Brooking, MD      . albuterol (PROVENTIL) (2.5 MG/3ML) 0.083% nebulizer solution 2.5 mg  2.5 mg Nebulization Q4H PRN Standley Brooking, MD      .  carvedilol (COREG) tablet 6.25 mg  6.25 mg Oral BID WC Standley Brooking, MD      . cefTAZidime (FORTAZ) 2 g in dextrose 5 % 50 mL IVPB  2 g Intravenous Q12H Standley Brooking, MD   2 g at 04/28/15 [phone]  . enoxaparin (LOVENOX) injection 40 mg  40 mg Subcutaneous Q24H Standley Brooking, MD   40 mg at 04/27/15 1700  . fluticasone (FLONASE) 50 MCG/ACT nasal spray 1 spray  1 spray Each Nare BID Standley Brooking, MD   1 spray at 04/28/15 0900  . gabapentin (NEURONTIN) capsule 200 mg  200 mg Oral TID Standley Brooking, MD   200 mg at 04/28/15 0900  . HYDROcodone-acetaminophen (NORCO/VICODIN) 5-325 MG per tablet 1-2 tablet  1-2 tablet Oral Q6H PRN Standley Brooking, MD      . levothyroxine (SYNTHROID, LEVOTHROID) tablet 50 mcg  50 mcg Oral QAC breakfast Standley Brooking, MD   50 mcg at 04/28/15 0810  . loratadine (CLARITIN) tablet 10 mg  10 mg Oral Daily Standley Brooking, MD   10 mg at 04/28/15 0900  . [START ON 04/29/2015] predniSONE (DELTASONE) tablet 40 mg  40 mg Oral Q breakfast Standley Brooking, MD      . sodium chloride 0.9 % injection 3 mL  3 mL Intravenous Q12H Standley Brooking, MD   3 mL at 04/27/15 2340  . sodium chloride 0.9 % injection 3 mL  3 mL Intravenous Q12H Standley Brooking, MD   3 mL at 04/27/15 2339  . sodium chloride 0.9 % injection 3 mL  3 mL Intravenous PRN Standley Brooking, MD      . traZODone (DESYREL) tablet 25 mg  25 mg Oral QHS PRN Standley Brooking, MD      . vancomycin (VANCOCIN) IVPB 750 mg/150 ml premix  750 mg Intravenous Q24H Standley Brooking, MD   750 mg at 04/27/15 1300   Do not use this list as official medication orders. Please verify with discharge summary.  Discharge Medications:   Medication List    ASK your doctor about these medications        acetaminophen 500 MG tablet  Commonly known as:  TYLENOL  Take 500 mg by mouth daily.     ARTIFICIAL TEAR OP  Place 1 drop into both eyes 2 (two) times daily.     CALTRATE 600 PO  Take 1 capsule by mouth every morning.     carvedilol 3.125 MG tablet  Commonly known as:  COREG  Take 3.125 mg by mouth 2 (two) times daily with a meal.     cetirizine 10 MG tablet  Commonly known as:  ZYRTEC  Take 10 mg by mouth daily.     DSS 100 MG Caps  Take 100 mg by mouth 2 (two) times daily.     ENSURE  Take 237 mLs by mouth 2 (two) times daily. Strawberry flavored     fluticasone 50 MCG/ACT nasal spray  Commonly known as:  FLONASE  Place 1 spray into both nostrils 2 (two) times daily.     gabapentin 100  MG capsule  Commonly known as:  NEURONTIN  Take 2 capsules (200 mg total) by mouth 3 (three) times daily.     HYDROcodone-acetaminophen 5-325 MG tablet  Commonly known as:  NORCO/VICODIN  Take 1-2 tablets by mouth every 6 (six) hours as needed for moderate pain.     levofloxacin 500 MG tablet  Commonly known as:  LEVAQUIN  Take 1 tablet by mouth daily. For 7 days, started on 10/27     levothyroxine 50 MCG tablet  Commonly known as:  SYNTHROID, LEVOTHROID  Take 50 mcg by mouth daily before breakfast.     multivitamin with minerals tablet  Take 1 tablet by mouth every morning.     ondansetron 4 MG tablet  Commonly known as:  ZOFRAN  Take 1 tablet (4 mg total) by mouth every 6 (six) hours as needed for nausea.     PATADAY 0.2 % Soln  Generic drug:  Olopatadine HCl  Apply 1 drop to eye daily.     PROAIR HFA 108 (90 BASE) MCG/ACT inhaler  Generic drug:  albuterol  Inhale 2 puffs into the lungs 3 (three) times daily.     SENEXON-S 8.6-50 MG tablet  Generic drug:  senna-docusate  Take 1 tablet by mouth 2 (two) times daily.     traZODone 50 MG tablet  Commonly known as:  DESYREL  Take 25 mg by mouth at bedtime as needed for sleep.     vitamin C 500 MG tablet  Commonly known as:  ASCORBIC ACID  Take 500 mg by mouth daily.        Relevant Imaging Results:  Relevant Lab Results:  Recent Labs    Additional Information    Karn CassisStultz, Talayia Hjort [place], [place] [phone]

## 2015-04-28 NOTE — Progress Notes (Signed)
PROGRESS NOTE  Kim [name]:[id number] DOB: 25-Sep-1917 DOA: 04/26/2015 PCP: Dwana Melena, MD  Summary: 53 yow PMH chronic afib and CAD presented with SOB and wheezing. While in the ED CXR revealed pneumonia and labs with mild elevation of lactic acid. Admitted for treatment of HCAP.  Assessment/Plan: 1. HCAP, sepsis, continue to improve.  2. Right pleural effusion. Respiratory status stable. Suspect parapneumonic effusion, improved s/p thoracentesis 10/31. Check echo to assess RV. 3. Chronic atrial fibrillation with mild intermittent RVR. Stable  4. Elevated transaminases, trending down. No RUQ pain. No history to suggest gallbladder pathology. likey reactive to pleural effusion. Discussed with surgery, follow clinically. Check echo as above.  5. Stable macrocytic anemia, anemia of chronic disease. Hgb remains stable. 6. Mild hyponatremia, resolved.    Continues to improve; continue abx.  Increase Coreg.  Mobilize, consult PT.  Transfer to medical bed today .  Code Status: DNR  DVT prophylaxis: SCDs Family Communication: No family bedside Disposition Plan: Transfer to medical bed.   Brendia Sacks, MD  [name]  Pager [phone] If 7PM-7AM, please contact night-coverage at [email or web address], password [facility] 04/28/2015, 6:30 AM  LOS: 2 days   Consultants:   Procedures:    Antibiotics:  Vancomycin 10/30>>  Ceftazidime 10/30 >>  HPI/Subjective: Doing good. Denies any cough, nausea, vomiting, pain. Able to sleep without difficulty.  Objective: Filed Vitals:   04/28/15 0200 04/28/15 0300 04/28/15 0400 04/28/15 0500  BP: 93/58 95/60    Pulse: 103 103    Temp:   97.3 F (36.3 C)   TempSrc:   Axillary   Resp: 20 20    Height:      Weight:    66.2 kg (145 lb 15.1 oz)  SpO2: 92% 94%      Intake/Output Summary (Last 24 hours) at 04/28/15 0630 Last data filed at 04/27/15 2339  Gross per 24 hour  Intake      3 ml  Output      0 ml  Net      3 ml      Filed Weights   04/26/15 1124 04/27/15 0400 04/28/15 0500  Weight: 57.153 kg (126 lb) 67.7 kg (149 lb 4 oz) 66.2 kg (145 lb 15.1 oz)    Exam: Afebrile, VSS, not hypoxic on [place].  General:  Appears calm and comfortable ENT: grossly normal hearing, lips & tongue Cardiovascular: Irregular rhythm and, no m/r/g. No LE edema. Telemetry:AFib  Respiratory: CTA bilaterally, no w/r/r. Normal respiratory effort. Abdomen: soft, ntnd, no RUQ pain  Musculoskeletal: grossly normal tone BUE/BLE Psychiatric: grossly normal mood and affect, speech fluent and appropriate  New data reviewed:  BMP unremarkable  LFT trending down   WBC now normal   Pleural unremarkable  CXR 10/31 revealed improvement in pleural effusion .   Hgb 11   Pertinent data since admission:  Lactic acid 2.9  CXR revealed worsening right suprahilar airspace disease and progressive moderate right pleural effusion.   Pending data:    Scheduled Meds: . carvedilol  3.125 mg Oral BID WC  . cefTAZidime (FORTAZ)  IV  2 g Intravenous Q12H  . enoxaparin (LOVENOX) injection  40 mg Subcutaneous Q24H  . fluticasone  1 spray Each Nare BID  . gabapentin  200 mg Oral TID  . levothyroxine  50 mcg Oral QAC breakfast  . loratadine  10 mg Oral Daily  . methylPREDNISolone (SOLU-MEDROL) injection  40 mg Intravenous Q12H  . sodium chloride  3 mL Intravenous Q12H  . sodium chloride  3 mL Intravenous Q12H  . vancomycin  750 mg Intravenous Q24H   Continuous Infusions:   Principal Problem:   HCAP (healthcare-associated pneumonia) Active Problems:   FIBRILLATION, ATRIAL   Sepsis (HCC)   Anemia of chronic disease   Pleural effusion on right   Hyponatremia   Time spent 20 minutes   By signing my name below, I, Kim [name] attest that this documentation has been prepared under the direction and in the presence of Brendia Sacksaniel Kimyata Milich, MD Electronically signed: Zadie CleverlyJessica [name]  04/28/2015 9:53am   I personally  performed the services described in this documentation. All medical record entries made by the scribe were at my direction. I have reviewed the chart and agree that the record reflects my personal performance and is accurate and complete. Brendia Sacksaniel Stancil Deisher, MD

## 2015-04-29 ENCOUNTER — Encounter (HOSPITAL_COMMUNITY): Payer: Self-pay | Admitting: *Deleted

## 2015-04-29 DIAGNOSIS — A419 Sepsis, unspecified organism: Principal | ICD-10-CM

## 2015-04-29 DIAGNOSIS — J189 Pneumonia, unspecified organism: Secondary | ICD-10-CM

## 2015-04-29 DIAGNOSIS — E871 Hypo-osmolality and hyponatremia: Secondary | ICD-10-CM

## 2015-04-29 DIAGNOSIS — J948 Other specified pleural conditions: Secondary | ICD-10-CM

## 2015-04-29 DIAGNOSIS — I482 Chronic atrial fibrillation: Secondary | ICD-10-CM

## 2015-04-29 LAB — COMPREHENSIVE METABOLIC PANEL
ALBUMIN: 2.4 g/dL — AB (ref 3.5–5.0)
ALK PHOS: 86 U/L (ref 38–126)
ALT: 143 U/L — AB (ref 14–54)
ANION GAP: 7 (ref 5–15)
AST: 101 U/L — ABNORMAL HIGH (ref 15–41)
BUN: 34 mg/dL — ABNORMAL HIGH (ref 6–20)
CALCIUM: 8.1 mg/dL — AB (ref 8.9–10.3)
CHLORIDE: 102 mmol/L (ref 101–111)
CO2: 25 mmol/L (ref 22–32)
Creatinine, Ser: 0.67 mg/dL (ref 0.44–1.00)
GFR calc non Af Amer: >60 mL/min (ref >60)
GLUCOSE: 141 mg/dL — AB (ref 65–99)
Potassium: 3.9 mmol/L (ref 3.5–5.1)
SODIUM: 134 mmol/L — AB (ref 135–145)
Total Bilirubin: 1.1 mg/dL (ref 0.3–1.2)
Total Protein: 5.6 g/dL — ABNORMAL LOW (ref 6.5–8.1)

## 2015-04-29 MED ORDER — CARVEDILOL 6.25 MG PO TABS: 6.2500 mg | ORAL_TABLET | Freq: Two times a day (BID) | ORAL | Status: DC

## 2015-04-29 MED ORDER — AMOXICILLIN-POT CLAVULANATE 500-125 MG PO TABS: 1.0000 | ORAL_TABLET | Freq: Two times a day (BID) | ORAL | Status: DC

## 2015-04-29 NOTE — Discharge Summary (Signed)
Physician Discharge Summary  Kim [name] ION:[id number] DOB: 1917-12-08 DOA: 04/26/2015  PCP: Dwana Melena, MD  Admit date: 04/26/2015 Discharge date: 04/29/2015  Time spent: 45 minutes  Recommendations for Outpatient Follow-up:  -Will be discharged back to ALF today. -To complete 5 days of Augmentin for PNA. -Will need follow up with PCP in 2 weeks.   Discharge Diagnoses:  Principal Problem:   HCAP (healthcare-associated pneumonia) Active Problems:   FIBRILLATION, ATRIAL   Sepsis (HCC)   Anemia of chronic disease   Pleural effusion on right   Hyponatremia   Discharge Condition: Stable and improved  Filed Weights   04/27/15 0400 04/28/15 0500 04/29/15 0500  Weight: 67.7 kg (149 lb 4 oz) 66.2 kg (145 lb 15.1 oz) 66.7 kg (147 lb 0.8 oz)    History of present illness:  As per Dr. Irene Limbo 10/30: 89 yow PMH chronic afib and CAD presented with SOB and wheezing. While in the ED CXR revealed pneumonia and labs with mild elevation of lactic acid. Admitted for treatment of HCAP. Of note, Discharged 10/18, treated for UTI with sepsis at that time, URI without evidence of pneumonia or hypoxia.  Has not felt good the last few days. Mild dysuria. Has been SOB, worse over the last several days. No fever or systemic symptoms. Poor appetite. Because of SOB today she was sent to the ED.  In the emergency department Afebrile, VSS, not hypoxic.  Pertinent labs: Sodium 133, BUN 33, Creatinine .76, BNP 547, troponin .03, lactic acid 2.8, WBC 14.6, Hgb 10.5 EKG: Independently reviewed. EKG independent review: Afib RVR Imaging: independently reviewed. CXR revealed worsening right suprahilar airspace disease and progressive moderate right pleural effusion.   Hospital Course:   HCAP, Sepsis -Sepsis parameters resolved. -Improving. -Will transition to augmentin for 5 more days of treatment.  Right Pleural Effusion -S/p thoracentesis 10/31 with 1000 ml removed.  Chronic A fib with  intermittent RVR -On increased dose of coreg. -Stable. -Not on chronic anticoagulation.  Transaminitis -Improving. -Suspect related to right heart failure evidenced on 2D ECHO. -No further work up given advanced age.  Hyponatremia -Resolved.  Procedures:  Thoracentesis   Consultations:  None  Discharge Instructions  Discharge Instructions    Diet - low sodium heart healthy    Complete by:  As directed      Increase activity slowly    Complete by:  As directed             Medication List    STOP taking these medications        levofloxacin 500 MG tablet  Commonly known as:  LEVAQUIN      TAKE these medications        acetaminophen 500 MG tablet  Commonly known as:  TYLENOL  Take 500 mg by mouth daily.     amoxicillin-clavulanate 500-125 MG tablet  Commonly known as:  AUGMENTIN  Take 1 tablet (500 mg total) by mouth 2 (two) times daily.     ARTIFICIAL TEAR OP  Place 1 drop into both eyes 2 (two) times daily.     CALTRATE 600 PO  Take 1 capsule by mouth every morning.     carvedilol 6.25 MG tablet  Commonly known as:  COREG  Take 1 tablet (6.25 mg total) by mouth 2 (two) times daily with a meal.     cetirizine 10 MG tablet  Commonly known as:  ZYRTEC  Take 10 mg by mouth daily.     DSS 100 MG  Caps  Take 100 mg by mouth 2 (two) times daily.     ENSURE  Take 237 mLs by mouth 2 (two) times daily. Strawberry flavored     fluticasone 50 MCG/ACT nasal spray  Commonly known as:  FLONASE  Place 1 spray into both nostrils 2 (two) times daily.     gabapentin 100 MG capsule  Commonly known as:  NEURONTIN  Take 2 capsules (200 mg total) by mouth 3 (three) times daily.     HYDROcodone-acetaminophen 5-325 MG tablet  Commonly known as:  NORCO/VICODIN  Take 1-2 tablets by mouth every 6 (six) hours as needed for moderate pain.     levothyroxine 50 MCG tablet  Commonly known as:  SYNTHROID, LEVOTHROID  Take 50 mcg by mouth daily before breakfast.      multivitamin with minerals tablet  Take 1 tablet by mouth every morning.     ondansetron 4 MG tablet  Commonly known as:  ZOFRAN  Take 1 tablet (4 mg total) by mouth every 6 (six) hours as needed for nausea.     PATADAY 0.2 % Soln  Generic drug:  Olopatadine HCl  Apply 1 drop to eye daily.     PROAIR HFA 108 (90 BASE) MCG/ACT inhaler  Generic drug:  albuterol  Inhale 2 puffs into the lungs 3 (three) times daily.     SENEXON-S 8.6-50 MG tablet  Generic drug:  senna-docusate  Take 1 tablet by mouth 2 (two) times daily.     traZODone 50 MG tablet  Commonly known as:  DESYREL  Take 25 mg by mouth at bedtime as needed for sleep.     vitamin C 500 MG tablet  Commonly known as:  ASCORBIC ACID  Take 500 mg by mouth daily.       Allergies  Allergen Reactions  . Nitrofurantoin Rash       Follow-up Information    Follow up with Dwana Melena, MD. Schedule an appointment as soon as possible for a visit in 2 weeks.   Specialty:  Internal Medicine   Contact information:   [address] [place] [address] [phone]        The results of significant diagnostics from this hospitalization (including imaging, microbiology, ancillary and laboratory) are listed below for reference.    Significant Diagnostic Studies: Dg Chest 1 View  04/26/2015  CLINICAL DATA:  SOB, pt had to be propped up to get semi erect film on stretcher, unable to cooperate or move EXAM: CHEST  1 VIEW COMPARISON:  04/12/2015 FINDINGS: Moderate right pleural effusion, increased from previous exam. New suprahilar airspace consolidation. Coarse interstitial opacities throughout both lungs as before. Heart size upper limits normal for technique. Atheromatous aorta. Pneumothorax. IMPRESSION: 1. Worsening right suprahilar airspace disease and progressive moderate right pleural effusion. Electronically Signed   By: Corlis Leak M.D.   On: 04/26/2015 12:44   Dg Chest 2 View  04/12/2015  CLINICAL DATA:  Shortness of  breath, cough, fever EXAM: CHEST  2 VIEW COMPARISON:  10/22/2013 FINDINGS: Cardiopericardial enlargement with aortic tortuosity. There are small bilateral pleural effusions, greater on the right. No definitive pulmonary edema. No pneumonia, collapse, or air leak. Chronic appearing T8 compression fracture with advanced height loss. Remote T12 compression fracture with vertebroplasty. IMPRESSION: Cardiomegaly and small pleural effusions. Electronically Signed   By: Marnee Spring M.D.   On: 04/12/2015 21:21   Dg Chest Port 1 View  04/27/2015  CLINICAL DATA:  Post RIGHT thoracentesis EXAM: PORTABLE CHEST 1  VIEW COMPARISON:  Portable exam 1314 hours compared to earlier study of 0652 hours FINDINGS: Decrease in RIGHT pleural effusion and basilar atelectasis post thoracentesis. No pneumothorax. Heart remains enlarged with pulmonary vascular congestion. Atherosclerotic calcification aorta. Central peribronchial thickening. LEFT lung clear. Bones demineralized. IMPRESSION: Decrease in RIGHT pleural effusion and basilar atelectasis post thoracentesis without pneumothorax. Enlargement of cardiac silhouette with pulmonary vascular congestion. Electronically Signed   By: Ulyses SouthwardMark  Boles M.D.   On: 04/27/2015 13:34   Dg Chest Port 1 View  04/27/2015  CLINICAL DATA:  Shortness of breath, healthcare associated pneumonia, sepsis EXAM: PORTABLE CHEST 1 VIEW COMPARISON:  Portable chest x-ray of April 26, 2015 FINDINGS: The patient is rotated on this study. There is been interval deterioration in the appearance of the right hemithorax with diffusely increased interstitial markings. There is a right pleural effusion layering posteriorly. The left lung is clear. The heart is mildly enlarged where visualized but the pulmonary vascularity is not engorged. The observed bony thorax exhibits no acute abnormality. IMPRESSION: Slight interval deterioration in the appearance of the right hemithorax where there is interstitial infiltrate  as well as a moderate-sized right pleural effusion. Electronically Signed   By: David  SwazilandJordan M.D.   On: 04/27/2015 07:38   [place] Abdomen Limited Ruq  04/27/2015  CLINICAL DATA:  Elevated LFTs EXAM: US ABDOMEN LIMITED - RIGHT UPPER QUADRANT COMPARISON:  None. FINDINGS: Gallbladder: The gallbladder is diffusely edematous measuring 5.8 mm in thickness. Negative sonographic Murphy's side. Negative for gallstones. Common bile duct: Diameter: 3.6 mm Liver: No focal lesion identified. Within normal limits in parenchymal echogenicity. IMPRESSION: 1. Gallbladder wall edema measuring 5.8 mm in maximum thickness. Electronically Signed   By: Signa Kellaylor  Stroud M.D.   On: 04/27/2015 10:32   [place] Thoracentesis Asp Pleural Space W/img Guide  04/27/2015  CLINICAL DATA:  RIGHT pleural effusion EXAM: ULTRASOUND GUIDED DIAGNOSTIC AND THERAPEUTIC THORACENTESIS COMPARISON:  Chest radiograph 04/27/2015 PROCEDURE: Procedure, benefits, and risks of procedure were discussed with patient. Written informed consent for procedure was obtained. Time out protocol followed. Pleural effusion localized by ultrasound at the posterior RIGHT hemithorax. Skin prepped and draped in usual sterile fashion. Skin and soft tissues anesthetized with 10 mL of 1% lidocaine. 8 French thoracentesis catheter placed into the RIGHT pleural space. 1000 mL of amber color fluid aspirated by syringe pump. Procedure tolerated well by patient without immediate complication. COMPLICATIONS: None FINDINGS: A total of approximately 1000 mL of RIGHT pleural fluid was removed. A fluid sample of 180 mL wassent for laboratory analysis. IMPRESSION: Successful ultrasound guided RIGHT thoracentesis yielding 1000 mL of pleural fluid. Electronically Signed   By: Ulyses SouthwardMark  Boles M.D.   On: 04/27/2015 14:21    Microbiology: Recent Results (from the past 240 hour(s))  Urine culture     Status: None   Collection Time: 04/26/15 12:57 PM  Result Value Ref Range Status   Specimen  Description URINE, CATHETERIZED  Final   Special Requests NONE  Final   Culture   Final    NO GROWTH 1 DAY [facility]    Report Status 04/27/2015 FINAL  Final  MRSA PCR Screening     Status: None   Collection Time: 04/26/15  6:00 PM  Result Value Ref Range Status   MRSA by PCR NEGATIVE NEGATIVE Final    Comment:        The GeneXpert MRSA Assay (FDA approved for NASAL specimens only), is one component of a comprehensive MRSA colonization surveillance program. It is  not intended to diagnose MRSA infection nor to guide or monitor treatment for MRSA infections.   Culture, blood (routine x 2) Call MD if unable to obtain prior to antibiotics being given     Status: None (Preliminary result)   Collection Time: 04/26/15  9:10 PM  Result Value Ref Range Status   Specimen Description BLOOD LEFT HAND  Final   Special Requests BOTTLES DRAWN AEROBIC AND ANAEROBIC 7CC  Final   Culture NO GROWTH 3 DAYS  Final   Report Status PENDING  Incomplete  Culture, blood (routine x 2) Call MD if unable to obtain prior to antibiotics being given     Status: None (Preliminary result)   Collection Time: 04/26/15  9:15 PM  Result Value Ref Range Status   Specimen Description BLOOD LEFT HAND  Final   Special Requests BOTTLES DRAWN AEROBIC AND ANAEROBIC 6CC  Final   Culture NO GROWTH 3 DAYS  Final   Report Status PENDING  Incomplete  Culture, body fluid-bottle     Status: None (Preliminary result)   Collection Time: 04/27/15 12:55 PM  Result Value Ref Range Status   Specimen Description FLUID RIGHT PLEURAL  Final   Special Requests BOTTLES DRAWN AEROBIC AND ANAEROBIC 10CC EACH  Final   Culture NO GROWTH 2 DAYS  Final   Report Status PENDING  Incomplete  Gram stain     Status: None   Collection Time: 04/27/15 12:55 PM  Result Value Ref Range Status   Specimen Description FLUID RIGHT PLEURAL  Final   Special Requests NONE  Final   Gram Stain   Final    FEW WBC PRESENT,  PREDOMINANTLY MONONUCLEAR NO ORGANISMS SEEN Performed at [facility]    Report Status 04/27/2015 FINAL  Final     Labs: Basic Metabolic Panel:  Recent Labs Lab 04/26/15 1139 04/27/15 0503 04/28/15 0523 04/29/15 0458  NA 133* 134* 135 134*  K 4.6 4.5 4.2 3.9  CL 98* 104 103 102  CO2 25 24 25 25   GLUCOSE 97 106* 119* 141*  BUN 33* 31* 32* 34*  CREATININE 0.76 0.81 0.82 0.67  CALCIUM 8.8* 8.1* 8.3* 8.1*   Liver Function Tests:  Recent Labs Lab 04/26/15 1139 04/27/15 0503 04/28/15 0523 04/29/15 0458  AST 413* 377* 199* 101*  ALT 223* 241* 190* 143*  ALKPHOS 102 102 93 86  BILITOT 1.6* 1.5* 1.4* 1.1  PROT 6.7 5.8* 5.8* 5.6*  ALBUMIN 3.1* 2.7* 2.5* 2.4*   No results for input(s): LIPASE, AMYLASE in the last 168 hours. No results for input(s): AMMONIA in the last 168 hours. CBC:  Recent Labs Lab 04/26/15 1139 04/27/15 0503 04/28/15 0523  WBC 14.6* 15.9* 10.0  HGB 10.5* 10.4* 11.0*  HCT 33.6* 33.9* 36.8  MCV 103.1* 102.1* 102.8*  PLT 245 212 201   Cardiac Enzymes:  Recent Labs Lab 04/26/15 1139  TROPONINI 0.03   BNP: BNP (last 3 results)  Recent Labs  04/12/15 2020 04/26/15 1139  BNP 473.0* 547.0*    ProBNP (last 3 results) No results for input(s): PROBNP in the last 8760 hours.  CBG: No results for input(s): GLUCAP in the last 168 hours.     SignedChaya Jan  [name] Pager: [phone] 04/29/2015, 10:34 AM

## 2015-04-29 NOTE — Plan of Care (Signed)
Problem: Physical Regulation: Goal: Ability to maintain clinical measurements within normal limits will improve Outcome: Adequate for Discharge [facility]

## 2015-04-29 NOTE — Care Management Note (Signed)
Case Management Note  Patient Details  Name: Kim [name] MRN: [id number] Date of Birth: [birth year]  Subjective/Objective:                  [facility] [place]. Pt admitted with HCAP.   Action/Plan: Pt returning to [facility] today. CSW has arranged for return to facility. PT has recommended HH PT. Order has been placed and pt will receive PT through [facility]. CSW has made facility aware of PT needs. No further CM needs.   Expected Discharge Date:      04/29/2015            Expected Discharge Plan:  Assisted Living / Rest Home  In-House Referral:  Clinical Social Work  Discharge planning Services  CM Consult  Post Acute Care Choice:  Home Health Choice offered to:  NA  DME Arranged:    DME Agency:     HH Arranged:  PT HH Agency:  Other - See comment  Status of Service:  Completed, signed off  Medicare Important Message Given:  Yes-second notification given Date Medicare IM Given:    Medicare IM give by:    Date Additional Medicare IM Given:    Additional Medicare Important Message give by:     If discussed at Long Length of Stay Meetings, dates discussed:    Additional Comments:  Malcolm MetroChildress, Tayden Duran Demske, RN 04/29/2015, 12:42 PM

## 2015-04-29 NOTE — Progress Notes (Addendum)
Patient taken out of facility by RN, NT, and [facility] employee. Packet given to [facility]. Family present at lunch time aware of transfer.

## 2015-04-29 NOTE — Care Management Important Message (Signed)
Important Message  Patient Details  Name: Irish EldersMary M Longsworth MRN: [id number] Date of Birth: 11/13/1917   Medicare Important Message Given:  Yes-second notification given    Malcolm MetroChildress, Casmer Yepiz Demske, RN 04/29/2015, 12:41 PM

## 2015-04-29 NOTE — Clinical Social Work Note (Signed)
CSW facilitated discharge.   CSW spoke with patient's daughter and advised that patient was being discharge.  Patient's daughter indicated that the facility would need to transport patient back to the facility due to patient's wheelchair being at the facility.  CSW spoke Marylene Landngela at [facility] who advised that someone would pick patient up today after 1:00 p.m.    CSW faxed discharge summary at Angela's request.  CSW signing off.  Annice NeedySettle, Abdulrahman Bracey D, [place] [phone]

## 2015-04-29 NOTE — NC FL2 (Signed)
[place] MEDICAID FL2 LEVEL OF CARE SCREENING TOOL     IDENTIFICATION  Patient Name: Kim [name] Birthdate: Sep 24, 1917 Sex: female Admission Date (Current Location): 04/26/2015  [facility] [place]   Facility and Address:  [facility],  [address], [place] [id number]      Provider Number: [id number]  Attending Physician Name and Address:  Micael HampshireEstela Y Hernandez Acost*  Relative Name and Phone Number:       Current Level of Care: Hospital Recommended Level of Care: Assisted Living Facility Prior Approval Number:    Date Approved/Denied:   PASRR Number:    Discharge Plan: Other (Comment) (ALF)    Current Diagnoses: Patient Active Problem List   Diagnosis Date Noted  . HCAP (healthcare-associated pneumonia) 04/26/2015  . Pleural effusion on right 04/26/2015  . Hyponatremia 04/26/2015  . Sepsis (HCC) 04/13/2015  . Anemia of chronic disease 04/13/2015  . UTI (urinary tract infection) 04/12/2015  . URI, acute 04/12/2015  . Cellulitis of left lower extremity 04/18/2014  . Cellulitis and abscess of leg, except foot 04/18/2014  . Femur fracture (HCC) 02/04/2014  . Unspecified constipation 02/03/2014  . Unspecified hereditary and idiopathic peripheral neuropathy 02/03/2014  . S/p left hip fracture 11/12/2013  . Herniated disc 11/12/2013  . Pressure ulcer, heel(707.07) 10/30/2013  . Right leg weakness 10/30/2013  . Acute blood loss anemia 10/26/2013  . Thrombocytopenia, unspecified (HCC) 10/26/2013  . Right hip pain 10/26/2013  . Subtrochanteric fracture of left femur (HCC) 10/25/2013  . Normocytic anemia 10/23/2013  . Femur fracture, left (HCC) 10/22/2013  . Fracture of hip, left, closed (HCC) 10/22/2013  . Knee pain, right 02/01/2012  . Unsteady gait 10/14/2010  . FIBRILLATION, ATRIAL 08/18/2006  . DETACHMENT, RETINAL NOS 05/22/2006  . OSTEOPOROSIS 05/22/2006  . Fracture, thoracic vertebra, compression (HCC) 05/22/2006     Orientation ACTIVITIES/SOCIAL BLADDER RESPIRATION    Self, Place  Family supportive Incontinent O2 (As needed) (2L)  BEHAVIORAL SYMPTOMS/MOOD NEUROLOGICAL BOWEL NUTRITION STATUS      Incontinent Diet (Diet Heart Healthy, Low sodium)  PHYSICIAN VISITS COMMUNICATION OF NEEDS Height & Weight Skin    Verbally 5\' 4"  (162.6 cm) 145 lbs. Other (Comment) (cracking to feet)          AMBULATORY STATUS RESPIRATION     (not ambulatory) O2 (As needed) (2L)      Personal Care Assistance Level of Assistance  Bathing, Dressing Bathing Assistance: Maximum assistance Feeding assistance: Limited assistance Dressing Assistance: Maximum assistance      Functional Limitations Info  Sight Sight Info: Impaired           SPECIAL CARE FACTORS FREQUENCY                      Additional Factors Info  Code Status, Allergies Code Status Info: DNR Allergies Info: Nitrofurantoin           Current Medications (04/29/2015): Current Facility-Administered Medications  Medication Dose Route Frequency Provider Last Rate Last Dose  . 0.9 %  sodium chloride infusion  250 mL Intravenous PRN Standley Brookinganiel P Goodrich, MD      . acetaminophen (TYLENOL) tablet 650 mg  650 mg Oral Q6H PRN Standley Brookinganiel P Goodrich, MD       Or  . acetaminophen (TYLENOL) suppository 650 mg  650 mg Rectal Q6H PRN Standley Brookinganiel P Goodrich, MD      . albuterol (PROVENTIL) (2.5 MG/3ML) 0.083% nebulizer solution 2.5 mg  2.5 mg Nebulization Q4H PRN Melton Alaraniel P  Irene Limbo, MD      . carvedilol (COREG) tablet 6.25 mg  6.25 mg Oral BID WC Standley Brooking, MD   6.25 mg at 04/29/15 0806  . cefTAZidime (FORTAZ) 2 g in dextrose 5 % 50 mL IVPB  2 g Intravenous Q12H Standley Brooking, MD   2 g at 04/29/15 0805  . enoxaparin (LOVENOX) injection 40 mg  40 mg Subcutaneous Q24H Standley Brooking, MD   40 mg at 04/28/15 1710  . fluticasone (FLONASE) 50 MCG/ACT nasal spray 1 spray  1 spray Each Nare BID Standley Brooking, MD   1 spray at 04/29/15 [phone]  .  gabapentin (NEURONTIN) capsule 200 mg  200 mg Oral TID Standley Brooking, MD   200 mg at 04/29/15 0959  . HYDROcodone-acetaminophen (NORCO/VICODIN) 5-325 MG per tablet 1-2 tablet  1-2 tablet Oral Q6H PRN Standley Brooking, MD      . levothyroxine (SYNTHROID, LEVOTHROID) tablet 50 mcg  50 mcg Oral QAC breakfast Standley Brooking, MD   50 mcg at 04/29/15 0806  . loratadine (CLARITIN) tablet 10 mg  10 mg Oral Daily Standley Brooking, MD   10 mg at 04/29/15 0959  . predniSONE (DELTASONE) tablet 40 mg  40 mg Oral Q breakfast Standley Brooking, MD   40 mg at 04/29/15 0806  . sodium chloride 0.9 % injection 3 mL  3 mL Intravenous Q12H Standley Brooking, MD   3 mL at 04/28/15 2230  . sodium chloride 0.9 % injection 3 mL  3 mL Intravenous Q12H Standley Brooking, MD   3 mL at 04/29/15 1000  . sodium chloride 0.9 % injection 3 mL  3 mL Intravenous PRN Standley Brooking, MD      . traZODone (DESYREL) tablet 25 mg  25 mg Oral QHS PRN Standley Brooking, MD      . vancomycin (VANCOCIN) IVPB 750 mg/150 ml premix  750 mg Intravenous Q24H Standley Brooking, MD   750 mg at 04/28/15 1500   Do not use this list as official medication orders. Please verify with discharge summary.  Discharge Medications:   Medication List    STOP taking these medications        levofloxacin 500 MG tablet  Commonly known as:  LEVAQUIN      TAKE these medications        acetaminophen 500 MG tablet  Commonly known as:  TYLENOL  Take 500 mg by mouth daily.     amoxicillin-clavulanate 500-125 MG tablet  Commonly known as:  AUGMENTIN  Take 1 tablet (500 mg total) by mouth 2 (two) times daily.     ARTIFICIAL TEAR OP  Place 1 drop into both eyes 2 (two) times daily.     CALTRATE 600 PO  Take 1 capsule by mouth every morning.     carvedilol 6.25 MG tablet  Commonly known as:  COREG  Take 1 tablet (6.25 mg total) by mouth 2 (two) times daily with a meal.     cetirizine 10 MG tablet  Commonly known as:  ZYRTEC  Take 10 mg  by mouth daily.     DSS 100 MG Caps  Take 100 mg by mouth 2 (two) times daily.     ENSURE  Take 237 mLs by mouth 2 (two) times daily. Strawberry flavored     fluticasone 50 MCG/ACT nasal spray  Commonly known as:  FLONASE  Place 1 spray into both nostrils 2 (two) times daily.  gabapentin 100 MG capsule  Commonly known as:  NEURONTIN  Take 2 capsules (200 mg total) by mouth 3 (three) times daily.     HYDROcodone-acetaminophen 5-325 MG tablet  Commonly known as:  NORCO/VICODIN  Take 1-2 tablets by mouth every 6 (six) hours as needed for moderate pain.     levothyroxine 50 MCG tablet  Commonly known as:  SYNTHROID, LEVOTHROID  Take 50 mcg by mouth daily before breakfast.     multivitamin with minerals tablet  Take 1 tablet by mouth every morning.     ondansetron 4 MG tablet  Commonly known as:  ZOFRAN  Take 1 tablet (4 mg total) by mouth every 6 (six) hours as needed for nausea.     PATADAY 0.2 % Soln  Generic drug:  Olopatadine HCl  Apply 1 drop to eye daily.     PROAIR HFA 108 (90 BASE) MCG/ACT inhaler  Generic drug:  albuterol  Inhale 2 puffs into the lungs 3 (three) times daily.     SENEXON-S 8.6-50 MG tablet  Generic drug:  senna-docusate  Take 1 tablet by mouth 2 (two) times daily.     traZODone 50 MG tablet  Commonly known as:  DESYREL  Take 25 mg by mouth at bedtime as needed for sleep.     vitamin C 500 MG tablet  Commonly known as:  ASCORBIC ACID  Take 500 mg by mouth daily.        Relevant Imaging Results:  Relevant Lab Results:  Recent Labs    Additional Information    Annice Needy, LCSW [phone]

## 2015-05-01 ENCOUNTER — Encounter (HOSPITAL_COMMUNITY): Payer: Self-pay | Admitting: Emergency Medicine

## 2015-05-01 ENCOUNTER — Emergency Department (HOSPITAL_COMMUNITY): Payer: Medicare Other

## 2015-05-01 ENCOUNTER — Inpatient Hospital Stay (HOSPITAL_COMMUNITY)
Admission: EM | Admit: 2015-05-01 | Discharge: 2015-05-04 | DRG: 194 | Disposition: A | Discharge status: Skilled Nursing Facility | Payer: Medicare Other | Attending: Internal Medicine | Admitting: Internal Medicine

## 2015-05-01 DIAGNOSIS — Z8261 Family history of arthritis: Secondary | ICD-10-CM

## 2015-05-01 DIAGNOSIS — R627 Adult failure to thrive: Secondary | ICD-10-CM | POA: Diagnosis not present

## 2015-05-01 DIAGNOSIS — J189 Pneumonia, unspecified organism: Secondary | ICD-10-CM | POA: Diagnosis present

## 2015-05-01 DIAGNOSIS — Z7189 Other specified counseling: Secondary | ICD-10-CM | POA: Insufficient documentation

## 2015-05-01 DIAGNOSIS — Z8249 Family history of ischemic heart disease and other diseases of the circulatory system: Secondary | ICD-10-CM | POA: Diagnosis not present

## 2015-05-01 DIAGNOSIS — I482 Chronic atrial fibrillation: Secondary | ICD-10-CM | POA: Diagnosis not present

## 2015-05-01 DIAGNOSIS — Y95 Nosocomial condition: Secondary | ICD-10-CM | POA: Diagnosis present

## 2015-05-01 DIAGNOSIS — E039 Hypothyroidism, unspecified: Secondary | ICD-10-CM | POA: Diagnosis present

## 2015-05-01 DIAGNOSIS — I5032 Chronic diastolic (congestive) heart failure: Secondary | ICD-10-CM | POA: Diagnosis present

## 2015-05-01 DIAGNOSIS — I4891 Unspecified atrial fibrillation: Secondary | ICD-10-CM | POA: Diagnosis present

## 2015-05-01 DIAGNOSIS — K761 Chronic passive congestion of liver: Secondary | ICD-10-CM | POA: Diagnosis present

## 2015-05-01 DIAGNOSIS — Z515 Encounter for palliative care: Secondary | ICD-10-CM | POA: Diagnosis not present

## 2015-05-01 DIAGNOSIS — R7401 Elevation of levels of liver transaminase levels: Secondary | ICD-10-CM | POA: Insufficient documentation

## 2015-05-01 DIAGNOSIS — Z66 Do not resuscitate: Secondary | ICD-10-CM | POA: Diagnosis present

## 2015-05-01 DIAGNOSIS — Z96652 Presence of left artificial knee joint: Secondary | ICD-10-CM | POA: Diagnosis present

## 2015-05-01 DIAGNOSIS — J948 Other specified pleural conditions: Secondary | ICD-10-CM

## 2015-05-01 DIAGNOSIS — D638 Anemia in other chronic diseases classified elsewhere: Secondary | ICD-10-CM | POA: Diagnosis present

## 2015-05-01 DIAGNOSIS — J90 Pleural effusion, not elsewhere classified: Secondary | ICD-10-CM | POA: Diagnosis present

## 2015-05-01 DIAGNOSIS — R740 Nonspecific elevation of levels of transaminase and lactic acid dehydrogenase [LDH]: Secondary | ICD-10-CM

## 2015-05-01 HISTORY — DX: Hypothyroidism, unspecified: E03.9

## 2015-05-01 HISTORY — DX: Heart failure, unspecified: I50.9

## 2015-05-01 LAB — BASIC METABOLIC PANEL
ANION GAP: 7 (ref 5–15)
BUN: 30 mg/dL — ABNORMAL HIGH (ref 6–20)
CALCIUM: 8.4 mg/dL — AB (ref 8.9–10.3)
CHLORIDE: 103 mmol/L (ref 101–111)
CO2: 28 mmol/L (ref 22–32)
CREATININE: 0.69 mg/dL (ref 0.44–1.00)
GFR calc Af Amer: >60 mL/min (ref >60)
GFR calc non Af Amer: >60 mL/min (ref >60)
GLUCOSE: 82 mg/dL (ref 65–99)
Potassium: 4.1 mmol/L (ref 3.5–5.1)
Sodium: 138 mmol/L (ref 135–145)

## 2015-05-01 LAB — URINE MICROSCOPIC-ADD ON

## 2015-05-01 LAB — CBC WITH DIFFERENTIAL/PLATELET
Basophils Absolute: 0 10*3/uL (ref 0.0–0.1)
Basophils Relative: 0 %
Eosinophils Absolute: 0.1 10*3/uL (ref 0.0–0.7)
Eosinophils Relative: 1 %
HEMATOCRIT: 39.7 % (ref 36.0–46.0)
HEMOGLOBIN: 12.4 g/dL (ref 12.0–15.0)
LYMPHS ABS: 1.6 10*3/uL (ref 0.7–4.0)
LYMPHS PCT: 14 %
MCH: 32.8 pg (ref 26.0–34.0)
MCHC: 31.2 g/dL (ref 30.0–36.0)
MCV: 105 fL — AB (ref 78.0–100.0)
MONO ABS: 1.3 10*3/uL — AB (ref 0.1–1.0)
MONOS PCT: 12 %
NEUTROS ABS: 8.3 10*3/uL — AB (ref 1.7–7.7)
Neutrophils Relative %: 74 %
Platelets: 154 10*3/uL (ref 150–400)
RBC: 3.78 MIL/uL — ABNORMAL LOW (ref 3.87–5.11)
RDW: 23.4 % — AB (ref 11.5–15.5)
WBC: 11.2 10*3/uL — ABNORMAL HIGH (ref 4.0–10.5)

## 2015-05-01 LAB — TROPONIN I: Troponin I: 0.04 ng/mL — ABNORMAL HIGH (ref <0.031)

## 2015-05-01 LAB — URINALYSIS, ROUTINE W REFLEX MICROSCOPIC
BILIRUBIN URINE: NEGATIVE
GLUCOSE, UA: NEGATIVE mg/dL
KETONES UR: NEGATIVE mg/dL
Leukocytes, UA: NEGATIVE
NITRITE: NEGATIVE
Specific Gravity, Urine: 1.02 (ref 1.005–1.030)
Urobilinogen, UA: 0.2 mg/dL (ref 0.0–1.0)
pH: 6 (ref 5.0–8.0)

## 2015-05-01 LAB — BRAIN NATRIURETIC PEPTIDE: B Natriuretic Peptide: 602 pg/mL — ABNORMAL HIGH (ref 0.0–100.0)

## 2015-05-01 LAB — CULTURE, BLOOD (ROUTINE X 2)
Culture: NO GROWTH
Culture: NO GROWTH

## 2015-05-01 LAB — LACTIC ACID, PLASMA: LACTIC ACID, VENOUS: 1.6 mmol/L (ref 0.5–2.0)

## 2015-05-01 MED ORDER — CARVEDILOL 3.125 MG PO TABS
6.2500 mg | ORAL_TABLET | Freq: Two times a day (BID) | ORAL | Status: DC
  Administered 2015-05-02 – 2015-05-03 (×5): 6.25 mg via ORAL
  Filled 2015-05-01 (×6): qty 2

## 2015-05-01 MED ORDER — ENSURE ENLIVE PO LIQD
237.0000 mL | Freq: Two times a day (BID) | ORAL | Status: DC
  Administered 2015-05-02 – 2015-05-04 (×5): 237 mL via ORAL

## 2015-05-01 MED ORDER — SODIUM CHLORIDE 0.9 % IV BOLUS (SEPSIS): 500.0000 mL | Freq: Once | INTRAVENOUS | Status: DC

## 2015-05-01 MED ORDER — SODIUM CHLORIDE 0.9 % IV SOLN
1000.0000 mL | Freq: Once | INTRAVENOUS | Status: AC
  Administered 2015-05-01: 1000 mL via INTRAVENOUS

## 2015-05-01 MED ORDER — LEVOTHYROXINE SODIUM 50 MCG PO TABS
50.0000 ug | ORAL_TABLET | Freq: Every day | ORAL | Status: DC
  Administered 2015-05-02 – 2015-05-04 (×3): 50 ug via ORAL
  Filled 2015-05-01 (×3): qty 1

## 2015-05-01 MED ORDER — SODIUM CHLORIDE 0.9 % IV BOLUS (SEPSIS)
500.0000 mL | Freq: Once | INTRAVENOUS | Status: AC
  Administered 2015-05-01: 500 mL via INTRAVENOUS

## 2015-05-01 MED ORDER — SENNOSIDES-DOCUSATE SODIUM 8.6-50 MG PO TABS
1.0000 | ORAL_TABLET | Freq: Two times a day (BID) | ORAL | Status: DC
  Administered 2015-05-02 – 2015-05-04 (×6): 1 via ORAL
  Filled 2015-05-01 (×6): qty 1

## 2015-05-01 MED ORDER — DEXTROSE 5 % IV SOLN
INTRAVENOUS | Status: AC
  Filled 2015-05-01: qty 2

## 2015-05-01 MED ORDER — DEXTROSE 5 % IV SOLN
1.0000 g | Freq: Two times a day (BID) | INTRAVENOUS | Status: DC
  Administered 2015-05-02: 1 g via INTRAVENOUS
  Filled 2015-05-01 (×5): qty 1

## 2015-05-01 MED ORDER — DOCUSATE SODIUM 100 MG PO CAPS
200.0000 mg | ORAL_CAPSULE | Freq: Every day | ORAL | Status: DC
  Administered 2015-05-02 – 2015-05-03 (×3): 200 mg via ORAL
  Filled 2015-05-01 (×3): qty 2

## 2015-05-01 MED ORDER — CEFTAZIDIME 2 G IJ SOLR
2.0000 g | Freq: Once | INTRAMUSCULAR | Status: AC
  Administered 2015-05-02: 2 g via INTRAVENOUS

## 2015-05-01 MED ORDER — FUROSEMIDE 10 MG/ML IJ SOLN
40.0000 mg | Freq: Once | INTRAMUSCULAR | Status: AC
  Administered 2015-05-02: 40 mg via INTRAVENOUS
  Filled 2015-05-01: qty 4

## 2015-05-01 MED ORDER — SODIUM CHLORIDE 0.9 % IJ SOLN
3.0000 mL | Freq: Two times a day (BID) | INTRAMUSCULAR | Status: DC
  Administered 2015-05-01 – 2015-05-04 (×6): 3 mL via INTRAVENOUS

## 2015-05-01 MED ORDER — ENOXAPARIN SODIUM 40 MG/0.4ML ~~LOC~~ SOLN
40.0000 mg | SUBCUTANEOUS | Status: DC
Start: 2015-05-01 — End: 2015-05-04
  Administered 2015-05-02 – 2015-05-03 (×3): 40 mg via SUBCUTANEOUS
  Filled 2015-05-01 (×3): qty 0.4

## 2015-05-01 MED ORDER — SODIUM CHLORIDE 0.9 % IV SOLN: Freq: Once | INTRAVENOUS | Status: DC

## 2015-05-01 MED ORDER — TRAZODONE HCL 50 MG PO TABS: 25.0000 mg | ORAL_TABLET | Freq: Every evening | ORAL | Status: DC | PRN

## 2015-05-01 MED ORDER — ACETAMINOPHEN 500 MG PO TABS
500.0000 mg | ORAL_TABLET | Freq: Every day | ORAL | Status: DC
  Administered 2015-05-02 – 2015-05-04 (×3): 500 mg via ORAL
  Filled 2015-05-01 (×3): qty 1

## 2015-05-01 NOTE — Progress Notes (Signed)
ANTIBIOTIC CONSULT NOTE - INITIAL  Pharmacy Consult for Ceftazidime Indication: pneumonia  Allergies  Allergen Reactions  . Nitrofurantoin Rash    Patient Measurements: Height: 5' (152.4 cm) Weight: 147 lb (66.679 kg) IBW/kg (Calculated) : 45.5 Adjusted Body Weight:   Vital Signs: Temp: 97.6 F (36.4 C) (11/04 1554) Temp Source: Oral (11/04 1554) BP: 105/64 mmHg (11/04 1930) Pulse Rate: 123 (11/04 1830) Intake/Output from previous day:   Intake/Output from this shift:    Labs:  Recent Labs  04/29/15 0458 05/01/15 1605  WBC  --  11.2*  HGB  --  12.4  PLT  --  154  CREATININE 0.67 0.69   Estimated Creatinine Clearance: 35.9 mL/min (by C-G formula based on Cr of 0.69). No results for input(s): VANCOTROUGH, VANCOPEAK, VANCORANDOM, GENTTROUGH, GENTPEAK, GENTRANDOM, TOBRATROUGH, TOBRAPEAK, TOBRARND, AMIKACINPEAK, AMIKACINTROU, AMIKACIN in the last 72 hours.   Microbiology: Recent Results (from the past 720 hour(s))  Urine culture     Status: None   Collection Time: 04/12/15 10:00 PM  Result Value Ref Range Status   Specimen Description URINE, CATHETERIZED  Final   Special Requests NONE  Final   Culture   Final    MULTIPLE SPECIES PRESENT, SUGGEST RECOLLECTION Performed at [facility]    Report Status 04/16/2015 FINAL  Final  Culture, blood (routine x 2)     Status: None   Collection Time: 04/12/15 10:55 PM  Result Value Ref Range Status   Specimen Description BLOOD LEFT ANTECUBITAL  Final   Special Requests BOTTLES DRAWN AEROBIC AND ANAEROBIC 6CC  Final   Culture NO GROWTH 5 DAYS  Final   Report Status 04/17/2015 FINAL  Final  Culture, blood (routine x 2)     Status: None   Collection Time: 04/12/15 10:58 PM  Result Value Ref Range Status   Specimen Description BLOOD LEFT ANTECUBITAL  Final   Special Requests BOTTLES DRAWN AEROBIC AND ANAEROBIC 6CC  Final   Culture NO GROWTH 5 DAYS  Final   Report Status 04/17/2015 FINAL  Final  Urine culture      Status: None   Collection Time: 04/26/15 12:57 PM  Result Value Ref Range Status   Specimen Description URINE, CATHETERIZED  Final   Special Requests NONE  Final   Culture   Final    NO GROWTH 1 DAY Performed at [facility]    Report Status 04/27/2015 FINAL  Final  MRSA PCR Screening     Status: None   Collection Time: 04/26/15  6:00 PM  Result Value Ref Range Status   MRSA by PCR NEGATIVE NEGATIVE Final    Comment:        The GeneXpert MRSA Assay (FDA approved for NASAL specimens only), is one component of a comprehensive MRSA colonization surveillance program. It is not intended to diagnose MRSA infection nor to guide or monitor treatment for MRSA infections.   Culture, blood (routine x 2) Call MD if unable to obtain prior to antibiotics being given     Status: None   Collection Time: 04/26/15  9:10 PM  Result Value Ref Range Status   Specimen Description BLOOD LEFT HAND  Final   Special Requests BOTTLES DRAWN AEROBIC AND ANAEROBIC 7CC  Final   Culture NO GROWTH 5 DAYS  Final   Report Status 05/01/2015 FINAL  Final  Culture, blood (routine x 2) Call MD if unable to obtain prior to antibiotics being given     Status: None   Collection Time: 04/26/15  9:15  PM  Result Value Ref Range Status   Specimen Description BLOOD LEFT HAND  Final   Special Requests BOTTLES DRAWN AEROBIC AND ANAEROBIC 6CC  Final   Culture NO GROWTH 5 DAYS  Final   Report Status 05/01/2015 FINAL  Final  Culture, body fluid-bottle     Status: None (Preliminary result)   Collection Time: 04/27/15 12:55 PM  Result Value Ref Range Status   Specimen Description FLUID RIGHT PLEURAL  Final   Special Requests BOTTLES DRAWN AEROBIC AND ANAEROBIC 10CC EACH  Final   Culture NO GROWTH 4 DAYS  Final   Report Status PENDING  Incomplete  Gram stain     Status: None   Collection Time: 04/27/15 12:55 PM  Result Value Ref Range Status   Specimen Description FLUID RIGHT PLEURAL  Final   Special Requests NONE   Final   Gram Stain   Final    FEW WBC PRESENT, PREDOMINANTLY MONONUCLEAR NO ORGANISMS SEEN Performed at [facility]    Report Status 04/27/2015 FINAL  Final    Medical History: Past Medical History  Diagnosis Date  . Atrial fibrillation (HCC)   . Osteoporosis     Knee; hip  . Wrist fracture 1988    Left  . Detached retina, right   . Chronic anticoagulation   . Compression fracture of thoracic spine, non-traumatic (HCC)   . Coronary artery disease   . DNR (do not resuscitate)   . CHF (congestive heart failure) (HCC)   . Hypothyroidism     Medications:  Scheduled:   Assessment: 81 yo female ED patient, recently admitted for HCAP. Discharged 2 days ago, since has been persistently impaired. Not getting up, eating. No obvious source of additional infection. Reduced renal function, CrCl 35.9 ml/min  Goal of Therapy:  Eradicate infection  Plan:  Ceftazidime 2 GM IV loading dose, then 1 GM IV every 12 hours Monitor renal function Labs per protocol  Kim Jamesittman, Kim [name] 05/01/2015,8:23 PM

## 2015-05-01 NOTE — ED Notes (Signed)
Hospaitlist at bedside for evaluation.

## 2015-05-01 NOTE — H&P (Signed)
History and Physical  Patient Name: Kim [name]     ZOX:[id number]    DOB: 03-28-1918    DOA: 05/01/2015 Referring physician: Donnetta HutchingBrian Cook, MD PCP: Dwana MelenaZack Hall, MD      Chief Complaint: Fatigue, confusion  HPI: Kim [name] is a 81 y.o. female with a past medical history significant for Afib not on warfarin, hypothyroidism, and recent UTI and then HCAP discharged to ALF two days ago who presents with decreased energy and failure to thrive at home.  The patient was admitted in mid October for UTI, discharged on levofloxacin back to her ALF.  One week ago, she was readmitted with RLL opacity and parapneumonic effusion, was on vancomycin and ceftazidime, underwent thoracentesis of 1L fluid (appears exudative with negative culture at 5d), and was discharged back to ALF with amoxicillin-clavulanate for five more days.  The following history was collected from daughter, Lillia CarmelCarol Phillips, by telephone: the patient returned to ALF on Wednesday, ate dinner in dining room, but had reduced appetite and very low energy. She is normally able to wheel herself around in her wheelchair, but was unable to do this Wednesday.  On Thursday, Okey RegalCarol talked with her by phone and said she was "not talkative" and perhaps confused.  Tonight, she was sent back to ER by ALF, and daughter found her profoundly weak and unable to maintain conversation.    In the ED, she was tachycardic in Afib, had elevated BUN-creatinine ratio, stable WBC, and unchanged pleural effusion.  She was able to answer questions, but very weak, sluggish and couldn't remember where she'd been for the last two days.  She reported right sided chest discomfort but no dyspnea or fever or cough.  There was minimal TNI elevation.  She was given 2L NS and TRH were asked to admit for failure to thrive at home in context of HCAP.     Review of Systems:  Pt complains of right chest discomfort, fatigue, confusion, decreased appetite. Pt denies any dyspnea,  fever, cough.  All other systems negative except as just noted or noted in the history of present illness.  Allergies  Allergen Reactions  . Nitrofurantoin Rash    Prior to Admission medications   Medication Sig Start Date End Date Taking? Authorizing Provider  acetaminophen (TYLENOL) 500 MG tablet Take 500 mg by mouth daily.   Yes Historical Provider, MD  amoxicillin-clavulanate (AUGMENTIN) 500-125 MG tablet Take 1 tablet (500 mg total) by mouth 2 (two) times daily. 04/29/15  Yes Estela Isaiah BlakesY Hernandez Acosta, MD  ARTIFICIAL TEAR OP Place 1 drop into both eyes 2 (two) times daily.   Yes Historical Provider, MD  Ascorbic Acid (VITAMIN C) 500 MG tablet Take 500 mg by mouth daily.     Yes Historical Provider, MD  Calcium Carbonate (CALTRATE 600 PO) Take 1 capsule by mouth every morning.    Yes Historical Provider, MD  carvedilol (COREG) 6.25 MG tablet Take 1 tablet (6.25 mg total) by mouth 2 (two) times daily with a meal. 04/29/15  Yes Estela Isaiah BlakesY Hernandez Acosta, MD  cetirizine (ZYRTEC) 10 MG tablet Take 10 mg by mouth daily.   Yes Historical Provider, MD  docusate sodium 100 MG CAPS Take 100 mg by mouth 2 (two) times daily. Patient taking differently: Take 200 mg by mouth at bedtime.  10/29/13  Yes Christiane Haorinna L Sullivan, MD  ENSURE (ENSURE) Take 237 mLs by mouth 2 (two) times daily. Strawberry flavored   Yes Historical Provider, MD  fluticasone (FLONASE) 50 MCG/ACT  nasal spray Place 1 spray into both nostrils 2 (two) times daily.    Yes Historical Provider, MD  gabapentin (NEURONTIN) 100 MG capsule Take 2 capsules (200 mg total) by mouth 3 (three) times daily. 10/29/13  Yes Christiane Ha, MD  HYDROcodone-acetaminophen (NORCO/VICODIN) 5-325 MG per tablet Take 1-2 tablets by mouth every 6 (six) hours as needed for moderate pain. Patient taking differently: Take 1 tablet by mouth 2 (two) times daily.  01/08/14  Yes Mahima Pandey, MD  levothyroxine (SYNTHROID, LEVOTHROID) 50 MCG tablet Take 50 mcg by mouth  daily before breakfast.   Yes Historical Provider, MD  Multiple Vitamins-Minerals (MULTIVITAMIN WITH MINERALS) tablet Take 1 tablet by mouth every morning.    Yes Historical Provider, MD  Olopatadine HCl (PATADAY) 0.2 % SOLN Apply 1 drop to eye daily.   Yes Historical Provider, MD  ondansetron (ZOFRAN) 4 MG tablet Take 1 tablet (4 mg total) by mouth every 6 (six) hours as needed for nausea. 10/29/13  Yes Christiane Ha, MD  PROAIR HFA 108 (90 BASE) MCG/ACT inhaler Inhale 2 puffs into the lungs 3 (three) times daily. 04/15/15  Yes Historical Provider, MD  senna-docusate (SENEXON-S) 8.6-50 MG tablet Take 1 tablet by mouth 2 (two) times daily.   Yes Historical Provider, MD  traZODone (DESYREL) 50 MG tablet Take 25 mg by mouth at bedtime as needed for sleep.   Yes Historical Provider, MD    Past Medical History  Diagnosis Date  . Atrial fibrillation (HCC)   . Osteoporosis     Knee; hip  . Wrist fracture 1988    Left  . Detached retina, right   . Chronic anticoagulation   . Compression fracture of thoracic spine, non-traumatic (HCC)   . Coronary artery disease   . DNR (do not resuscitate)   . CHF (congestive heart failure) (HCC)   . Hypothyroidism     Past Surgical History  Procedure Laterality Date  . Sp kyphoplasty      Percutaneous  . Retinal detachment surgery    . Cataract extraction      Bilateral  . Total knee arthroplasty  2006    Left  . Breast excisional biopsy  1954; 1968    Benign disease  . Orif wrist fracture  1988  . Colonoscopy  2000  . Back surgery    . Femur im nail Left 10/25/2013    Procedure: INTRAMEDULLARY (IM) NAIL FEMORAL WITH DALL MILES CABLE TO LEFT FEMUR;  Surgeon: Vickki Hearing, MD;  Location: [facility];  Service: Orthopedics;  Laterality: Left;  BIOMET     Family history: There is some heart disease and arthritis, in her mother and father.  Social History: Patient lives in an ALF.  She is normally able to transport by her self in a wheelchair.   She is independent with ADLs at baseline.  She is a never smoker.  She is from [facility].       Physical Exam: BP 105/64 mmHg  Pulse 123  Temp(Src) 97.6 F (36.4 C) (Oral)  Resp 19  Ht 5' (1.524 m)  Wt 66.679 kg (147 lb)  BMI 28.71 kg/m2  SpO2 94% General appearance: Elderly  female, eyes closed and sluggish but in no acute distress.   Eyes: Anicteric, conjunctiva pink, lids and lashes normal.  The right pupil appears deformed, but reacts to light. ENT: No nasal deformity, discharge, or epistaxis.  OP moist without lesions.   Skin: Warm and dry.  No suspicious rashes or  lesions. Cardiac: Tachycardic, irregularly irregular, nl S1-S2, no murmurs appreciated.  Capillary refill is brisk.  Distended EJs, IJ pulse not clearly seen.  1+ scant ankle bilateral edema.  Radial pulses 2+ and symmetric, DP pulses not felt but feet warm. Respiratory: Tachypnea without respiratory distress.  Dependent crackles audible on left. Breath sounds diminished on right.  Poor inspiratory effort overall. Abdomen: Abdomen soft without rigidity.  No TTP. No ascites, distension.   MSK: No deformities or effusions. Neuro: Sluggish.  Knows she is in hospital, doesn't remember where she has been last two days.  Disoriented to date.  Responding to questions, with eyes closed.  Speech is fluent.  Moves all extremities equally but 3/5 strength globally.   Psych: Unable to test due to mentation.    Labs on Admission:  The metabolic panel shows normal sodium.  Elevated BUN/creatinine ratio, but normal renal function. Transaminitis around 3x ULN for AST/ALT at discharge.  Bilirubin normal.  Lactic acid level normal. BNP 602 pg/mL up from previous of 473. Urinalysis clear. The complete blood count shows mild leukocytosis 11.2K/ul, stable from discharge.  Minimal anemia, no thrombocytopenia.   Radiological Exams on Admission: Personally reviewed: Dg Chest 2V 05/01/2015   Medium R pleural effusion, unchanged  from previous 1V.  Fluid in fissure.  Apparent atelectasis in R base, similar to previous.   EKG: Independently reviewed. Afib without ST changes.  Old anterior non-specific TW changes.    Assessment/Plan  1. Failure to thrive in context of HCAP:  This is new.  Recently admitted with HCAP and discharged to ALF.  Very weak and unable to care for self in ALF.  Gradual decline over months, per daughter.  Will resume ceftazidime for good GPC and GNR coverage incl. Pseudomonas given recent hospitalizations and Levaquin use, but will defer MRSA coverage, as this doesn't seem to fit the clinical picture. -Admit to telemetry -Stop amoxicillin-clavulanate, start ceftazidime while inpatient -Hold further fluids given #2 below -Consult PT/OT for placement -Will check BNP and lactic acid -Ensure dietary supplement twice daily  2. Acute on chronic diastolic CHF:  The clinical exam, appearance of the patient's chest x-ray, elevated BNP and normal lactic acid, to me suggest fluid overload. -Furosemide 40 mg IV once -Strict I/Os and daily weights  3. Atrial fibrillation:  Chads2Vasc 4.  Rapid rate despite increased carvedilol in last hospitalization.  Suspect fluid overload is contributing.  -Diuresis as above  -Continue carvedilol -May start dilt gtt overnight if HR > 130s bpm  4. Hypothyroidism:  Stable.  TSH normal in Oct 2016. -Continue home levothyroxine.  5. Transaminitis:  Stable. Suspect hepatic congestion. -Repeat LFT tomorrow  6. Elevated troponin:  Stable. Suspect this is mild troponin leak insetting of CHF. -Repeat troponin once, if flat discontinue  7. Chronic anemia:  Stable.      DVT PPx: Lovenox Diet: Cardiac Code Status: DO NOT RESUSCITATE Family Communication: the patient's diagnosis, workup, and treatment plan were discussed with her daughter Okey Regal by phone. CODE STATUS was confirmed. All questions were answered.  Medical decision making: What exists of the  patient's previous chart was reviewed in depth and the case was discussed with Dr. Adriana Simas. Patient seen 8:22 PM on 05/01/2015.  Disposition Plan:  Admit to tele.  PT eval for placement.  Continue IV antibiotics for HCAP.  Diuresis.  Expect admission for 3-4 days.      Alberteen Sam [name] Pager [phone]

## 2015-05-01 NOTE — ED Notes (Addendum)
Per EMS: Pt from [facility] was admitted to hospital and discharged 2 days ago after having pneumonia.  Pt since has been weak and not wanting to eat. Pt alert and oriented.  105cbg, 95 percent, 132/99

## 2015-05-01 NOTE — ED Notes (Signed)
Per daughter-Carol phone number [phone].

## 2015-05-01 NOTE — ED Provider Notes (Signed)
CSN: [id number]     Arrival date & time 05/01/15  1550 History   First MD Initiated Contact with Patient 05/01/15 1600     Chief Complaint  Patient presents with  . Weakness     (Consider location/radiation/quality/duration/timing/severity/associated sxs/prior Treatment) HPI..... Level V caveat for severe illness.  Status post recent admission for healthcare associated pneumonia, elevated liver functions, right pleural effusion. Patient was discharged from the hospital 2 days ago. She has not been doing well since that time. Not getting up in her wheelchair. Not eating. No specific somatic complaints.  Past Medical History  Diagnosis Date  . Atrial fibrillation (HCC)   . Osteoporosis     Knee; hip  . Wrist fracture 1988    Left  . Detached retina, right   . Chronic anticoagulation   . Compression fracture of thoracic spine, non-traumatic (HCC)   . Coronary artery disease   . DNR (do not resuscitate)    Past Surgical History  Procedure Laterality Date  . Sp kyphoplasty      Percutaneous  . Retinal detachment surgery    . Cataract extraction      Bilateral  . Total knee arthroplasty  2006    Left  . Breast excisional biopsy  1954; 1968    Benign disease  . Orif wrist fracture  1988  . Colonoscopy  2000  . Back surgery    . Femur im nail Left 10/25/2013    Procedure: INTRAMEDULLARY (IM) NAIL FEMORAL WITH DALL MILES CABLE TO LEFT FEMUR;  Surgeon: Vickki Hearing, MD;  Location: [facility];  Service: Orthopedics;  Laterality: Left;  BIOMET    Family History  Problem Relation Age of Onset  . Heart disease Father     deceased age 86  . Heart attack Mother   . Arthritis     Social History  Substance Use Topics  . Smoking status: Never Smoker   . Smokeless tobacco: Never Used  . Alcohol Use: No   OB History    No data available     Review of Systems    Allergies  Nitrofurantoin  Home Medications   Prior to Admission medications   Medication Sig Start Date End  Date Taking? Authorizing Provider  acetaminophen (TYLENOL) 500 MG tablet Take 500 mg by mouth daily.   Yes Historical Provider, MD  amoxicillin-clavulanate (AUGMENTIN) 500-125 MG tablet Take 1 tablet (500 mg total) by mouth 2 (two) times daily. 04/29/15  Yes Estela Isaiah Blakes, MD  ARTIFICIAL TEAR OP Place 1 drop into both eyes 2 (two) times daily.   Yes Historical Provider, MD  Ascorbic Acid (VITAMIN C) 500 MG tablet Take 500 mg by mouth daily.     Yes Historical Provider, MD  Calcium Carbonate (CALTRATE 600 PO) Take 1 capsule by mouth every morning.    Yes Historical Provider, MD  carvedilol (COREG) 6.25 MG tablet Take 1 tablet (6.25 mg total) by mouth 2 (two) times daily with a meal. 04/29/15  Yes Estela Isaiah Blakes, MD  cetirizine (ZYRTEC) 10 MG tablet Take 10 mg by mouth daily.   Yes Historical Provider, MD  docusate sodium 100 MG CAPS Take 100 mg by mouth 2 (two) times daily. Patient taking differently: Take 200 mg by mouth at bedtime.  10/29/13  Yes Christiane Ha, MD  ENSURE (ENSURE) Take 237 mLs by mouth 2 (two) times daily. Strawberry flavored   Yes Historical Provider, MD  fluticasone (FLONASE) 50 MCG/ACT nasal spray Place 1 spray  into both nostrils 2 (two) times daily.    Yes Historical Provider, MD  gabapentin (NEURONTIN) 100 MG capsule Take 2 capsules (200 mg total) by mouth 3 (three) times daily. 10/29/13  Yes Christiane Haorinna L Sullivan, MD  HYDROcodone-acetaminophen (NORCO/VICODIN) 5-325 MG per tablet Take 1-2 tablets by mouth every 6 (six) hours as needed for moderate pain. Patient taking differently: Take 1 tablet by mouth 2 (two) times daily.  01/08/14  Yes Mahima Pandey, MD  levothyroxine (SYNTHROID, LEVOTHROID) 50 MCG tablet Take 50 mcg by mouth daily before breakfast.   Yes Historical Provider, MD  Multiple Vitamins-Minerals (MULTIVITAMIN WITH MINERALS) tablet Take 1 tablet by mouth every morning.    Yes Historical Provider, MD  Olopatadine HCl (PATADAY) 0.2 % SOLN Apply 1  drop to eye daily.   Yes Historical Provider, MD  ondansetron (ZOFRAN) 4 MG tablet Take 1 tablet (4 mg total) by mouth every 6 (six) hours as needed for nausea. 10/29/13  Yes Christiane Haorinna L Sullivan, MD  PROAIR HFA 108 (90 BASE) MCG/ACT inhaler Inhale 2 puffs into the lungs 3 (three) times daily. 04/15/15  Yes Historical Provider, MD  senna-docusate (SENEXON-S) 8.6-50 MG tablet Take 1 tablet by mouth 2 (two) times daily.   Yes Historical Provider, MD  traZODone (DESYREL) 50 MG tablet Take 25 mg by mouth at bedtime as needed for sleep.   Yes Historical Provider, MD   BP 105/64 mmHg  Pulse 123  Temp(Src) 97.6 F (36.4 C) (Oral)  Resp 19  Ht 5' (1.524 m)  Wt 147 lb (66.679 kg)  BMI 28.71 kg/m2  SpO2 94% Physical Exam  Constitutional:  Patient mumbles answers to questions  HENT:  Head: Normocephalic and atraumatic.  Eyes: Conjunctivae are normal. Pupils are equal, round, and reactive to light.  Neck: Normal range of motion. Neck supple.  Cardiovascular: Normal rate and regular rhythm.   Pulmonary/Chest: Effort normal and breath sounds normal.  Abdominal: Soft. Bowel sounds are normal.  Musculoskeletal:  Unable  Neurological:  Unable  Skin: Skin is warm and dry.  Psychiatric:  Flat affect  Nursing note and vitals reviewed.   ED Course  Procedures (including critical care time) Labs Review Labs Reviewed  CBC WITH DIFFERENTIAL/PLATELET - Abnormal; Notable for the following:    WBC 11.2 (*)    RBC 3.78 (*)    MCV 105.0 (*)    RDW 23.4 (*)    Neutro Abs 8.3 (*)    Monocytes Absolute 1.3 (*)    All other components within normal limits  BASIC METABOLIC PANEL - Abnormal; Notable for the following:    BUN 30 (*)    Calcium 8.4 (*)    All other components within normal limits  URINALYSIS, ROUTINE W REFLEX MICROSCOPIC (NOT AT [facility]) - Abnormal; Notable for the following:    Hgb urine dipstick TRACE (*)    Protein, ur TRACE (*)    All other components within normal limits  TROPONIN I  - Abnormal; Notable for the following:    Troponin I 0.04 (*)    All other components within normal limits  URINE MICROSCOPIC-ADD ON - Abnormal; Notable for the following:    Squamous Epithelial / LPF FEW (*)    All other components within normal limits  BRAIN NATRIURETIC PEPTIDE  LACTIC ACID, PLASMA  LACTIC ACID, PLASMA    Imaging Review Dg Chest Port 1 View  05/01/2015  CLINICAL DATA:  81 year old who was discharged from the hospital 2 days ago with pneumonia, presenting back from the  nursing home with generalized weakness and anorexia. EXAM: PORTABLE CHEST 1 VIEW COMPARISON:  04/27/2015 and earlier. FINDINGS: Cardiac silhouette moderately enlarged, unchanged. Elevation of the right hemidiaphragm with likely subpulmonic right pleural effusion, unchanged since the examination 5 days ago. Consolidation in the right lower lobe and right middle lobe, unchanged. No new pulmonary parenchymal abnormalities. IMPRESSION: 1. Likely subpulmonic right pleural effusion, unchanged since examination 5 days ago. 2. Associated dense atelectasis and/or pneumonia in the right lower lobe and right middle lobe, also unchanged. 3. No new abnormalities. Electronically Signed   By: Hulan Saas M.D.   On: 05/01/2015 17:05   I have personally reviewed and evaluated these images and lab results as part of my medical decision-making.   EKG Interpretation   Date/Time:  Friday May 01 2015 15:57:35 EDT Ventricular Rate:  117 PR Interval:    QRS Duration: 79 QT Interval:  407 QTC Calculation: 568 R Axis:   34 Text Interpretation:  Atrial fibrillation Low voltage, extremity and  precordial leads Abnormal R-wave progression, early transition  Repolarization abnormality, prob rate related Prolonged QT interval  Baseline wander in lead(s) V2 Confirmed by Noelani Harbach  MD, Quanesha Klimaszewski ([id number]) on  05/01/2015 4:38:47 PM     CRITICAL CARE Performed by: Donnetta Hutching Total critical care time: 30 minutes Critical care time was  exclusive of separately billable procedures and treating other patients. Critical care was necessary to treat or prevent imminent or life-threatening deterioration. Critical care was time spent personally by me on the following activities: development of treatment plan with patient and/or surrogate as well as nursing, discussions with consultants, evaluation of patient's response to treatment, examination of patient, obtaining history from patient or surrogate, ordering and performing treatments and interventions, ordering and review of laboratory studies, ordering and review of radiographic studies, pulse oximetry and re-evaluation of patient's condition. MDM   Final diagnoses:  Adult failure to thrive    Patient has been recently admitted for healthcare associated pneumonia, elevated liver functions, right pleural effusion. She was discharged 2 days ago. Since that time she has been persistently impaired. Not getting up. Not eating. Workup in the emergency department shows no obvious source for additional infection. Admit to general medicine.    Donnetta Hutching, MD 05/01/15 2008

## 2015-05-02 LAB — COMPREHENSIVE METABOLIC PANEL
ALT: 66 U/L — ABNORMAL HIGH (ref 14–54)
AST: 45 U/L — AB (ref 15–41)
Albumin: 2.6 g/dL — ABNORMAL LOW (ref 3.5–5.0)
Alkaline Phosphatase: 81 U/L (ref 38–126)
Anion gap: 10 (ref 5–15)
BILIRUBIN TOTAL: 2.1 mg/dL — AB (ref 0.3–1.2)
BUN: 27 mg/dL — AB (ref 6–20)
CHLORIDE: 103 mmol/L (ref 101–111)
CO2: 27 mmol/L (ref 22–32)
CREATININE: 0.7 mg/dL (ref 0.44–1.00)
Calcium: 8.2 mg/dL — ABNORMAL LOW (ref 8.9–10.3)
Glucose, Bld: 85 mg/dL (ref 65–99)
POTASSIUM: 3.5 mmol/L (ref 3.5–5.1)
Sodium: 140 mmol/L (ref 135–145)
TOTAL PROTEIN: 5.8 g/dL — AB (ref 6.5–8.1)

## 2015-05-02 LAB — CBC
HCT: 41.6 % (ref 36.0–46.0)
Hemoglobin: 13.3 g/dL (ref 12.0–15.0)
MCH: 33.5 pg (ref 26.0–34.0)
MCHC: 32 g/dL (ref 30.0–36.0)
MCV: 104.8 fL — ABNORMAL HIGH (ref 78.0–100.0)
PLATELETS: 146 10*3/uL — AB (ref 150–400)
RBC: 3.97 MIL/uL (ref 3.87–5.11)
RDW: 23.5 % — AB (ref 11.5–15.5)
WBC: 10.6 10*3/uL — AB (ref 4.0–10.5)

## 2015-05-02 LAB — TROPONIN I: TROPONIN I: 0.03 ng/mL (ref <0.031)

## 2015-05-02 LAB — CULTURE, BODY FLUID-BOTTLE: CULTURE: NO GROWTH

## 2015-05-02 LAB — LACTIC ACID, PLASMA: Lactic Acid, Venous: 2 mmol/L (ref 0.5–2.0)

## 2015-05-02 LAB — CULTURE, BODY FLUID W GRAM STAIN -BOTTLE

## 2015-05-02 MED ORDER — AMOXICILLIN-POT CLAVULANATE 500-125 MG PO TABS
1.0000 | ORAL_TABLET | Freq: Three times a day (TID) | ORAL | Status: DC
  Administered 2015-05-02 – 2015-05-04 (×6): 500 mg via ORAL
  Filled 2015-05-02 (×7): qty 1

## 2015-05-02 NOTE — Progress Notes (Signed)
[name] PROGRESS NOTE  Kim [name] WGN:[id number] DOB: 11-28-1917 DOA: 05/01/2015 PCP: Dwana Melena, MD  Assessment/Plan: Kim [name] -Likely related to recent infections in this elderly patient. -Discussed at length with family at bedside. -ALF is not able to meet her current needs, family requesting SNF. Explained that this would likely be long term and not for rehab. Have also explained the concept of hospice and palliative care and they are willing to be present for a consultation. Will request on Monday when available.  Recent HCAP -No need for IV abx. -Continue planned course of Augmentin for 3 more days.  Chronic Diastolic CHF -Do not believe she has acute CHF.  Atrial Fibrillation -Rates in the 120s currently. -Continue coreg.  Hypothyroidism -Continue synthroid.  Elevated Troponin -Flat. -Of uncertain value, but no further work up in this elderly patient (discussed with family at bedside).  Code Status: DNR Family Communication: Daughter and son in law at bedside  Disposition Plan: SNF   Consultants:  None   Antibiotics:  Augmentin   Subjective: Sleeping, arouses to voice has no complaints.  Objective: Filed Vitals:   05/01/15 2032 05/01/15 2107 05/02/15 0639 05/02/15 1246  BP: 136/84 137/90 111/68 114/74  Pulse: 113 100 118 126  Temp:  97.7 F (36.5 C) 98.3 F (36.8 C) 98 F (36.7 C)  TempSrc:  Oral Oral Oral  Resp: Height:  5' (1.524 m)    Weight:  63.504 kg (140 lb)    SpO2: 95% 96% 99% 98%    Intake/Output Summary (Last 24 hours) at 05/02/15 1415 Last data filed at 05/02/15 1200  Gross per 24 hour  Intake    120 ml  Output      0 ml  Net    120 ml   Filed Weights   05/01/15 1553 05/01/15 2107  Weight: 66.679 kg (147 lb) 63.504 kg (140 lb)    Exam:   General:  awake  Cardiovascular: tachy, irregular  Respiratory: CTA B  Abdomen: S/NT/ND/+BS  Extremities: no C/C/E, contractures   Neurologic:   Moves all 4 spontaneously  Data Reviewed: Basic Metabolic Panel:  Recent Labs Lab 04/27/15 0503 04/28/15 0523 04/29/15 0458 05/01/15 1605 05/02/15 0617  NA 134* 135 134* 138 140  K 4.5 4.2 3.9 4.1 3.5  CL 104 103 102 103 103  CO2 GLUCOSE 106* 119* 141* 82 85  BUN 31* 32* 34* 30* 27*  CREATININE 0.81 0.82 0.67 0.69 0.70  CALCIUM 8.1* 8.3* 8.1* 8.4* 8.2*   Liver Function Tests:  Recent Labs Lab 04/26/15 1139 04/27/15 0503 04/28/15 0523 04/29/15 0458 05/02/15 0617  AST 413* 377* 199* 101* 45*  ALT 223* 241* 190* 143* 66*  ALKPHOS 102 102 93 86 81  BILITOT 1.6* 1.5* 1.4* 1.1 2.1*  PROT 6.7 5.8* 5.8* 5.6* 5.8*  ALBUMIN 3.1* 2.7* 2.5* 2.4* 2.6*   No results for input(s): LIPASE, AMYLASE in the last 168 hours. No results for input(s): AMMONIA in the last 168 hours. CBC:  Recent Labs Lab 04/26/15 1139 04/27/15 0503 04/28/15 0523 05/01/15 1605 05/02/15 0617  WBC 14.6* 15.9* 10.0 11.2* 10.6*  NEUTROABS  --   --   --  8.3*  --   HGB 10.5* 10.4* 11.0* 12.4 13.3  HCT 33.6* 33.9* 36.8 39.7 41.6  MCV 103.1* 102.1* 102.8* 105.0* 104.8*  PLT 245 212 201 154 146*   Cardiac Enzymes:  Recent Labs Lab 04/26/15 1139 05/01/15 1605  05/02/15 0617  TROPONINI 0.03 0.04* 0.03   BNP (last 3 results)  Recent Labs  04/12/15 2020 04/26/15 1139 05/01/15 1605  BNP 473.0* 547.0* 602.0*    ProBNP (last 3 results) No results for input(s): PROBNP in the last 8760 hours.  CBG: No results for input(s): GLUCAP in the last 168 hours.  Recent Results (from the past 240 hour(s))  Urine culture     Status: None   Collection Time: 04/26/15 12:57 PM  Result Value Ref Range Status   Specimen Description URINE, CATHETERIZED  Final   Special Requests NONE  Final   Culture   Final    NO GROWTH 1 DAY [facility]    Report Status 04/27/2015 FINAL  Final  MRSA PCR Screening     Status: None   Collection Time: 04/26/15  6:00 PM  Result Value  Ref Range Status   MRSA by PCR NEGATIVE NEGATIVE Final    Comment:        The GeneXpert MRSA Assay (FDA approved for NASAL specimens only), is one component of a comprehensive MRSA colonization surveillance program. It is not intended to diagnose MRSA infection nor to guide or monitor treatment for MRSA infections.   Culture, blood (routine x 2) Call MD if unable to obtain prior to antibiotics being given     Status: None   Collection Time: 04/26/15  9:10 PM  Result Value Ref Range Status   Specimen Description BLOOD LEFT HAND  Final   Special Requests BOTTLES DRAWN AEROBIC AND ANAEROBIC 7CC  Final   Culture NO GROWTH 5 DAYS  Final   Report Status 05/01/2015 FINAL  Final  Culture, blood (routine x 2) Call MD if unable to obtain prior to antibiotics being given     Status: None   Collection Time: 04/26/15  9:15 PM  Result Value Ref Range Status   Specimen Description BLOOD LEFT HAND  Final   Special Requests BOTTLES DRAWN AEROBIC AND ANAEROBIC 6CC  Final   Culture NO GROWTH 5 DAYS  Final   Report Status 05/01/2015 FINAL  Final  Culture, body fluid-bottle     Status: None   Collection Time: 04/27/15 12:55 PM  Result Value Ref Range Status   Specimen Description FLUID RIGHT PLEURAL  Final   Special Requests BOTTLES DRAWN AEROBIC AND ANAEROBIC 10CC EACH  Final   Culture NO GROWTH 5 DAYS  Final   Report Status 05/02/2015 FINAL  Final  Gram stain     Status: None   Collection Time: 04/27/15 12:55 PM  Result Value Ref Range Status   Specimen Description FLUID RIGHT PLEURAL  Final   Special Requests NONE  Final   Gram Stain   Final    FEW WBC PRESENT, PREDOMINANTLY MONONUCLEAR NO ORGANISMS SEEN Performed at [facility]    Report Status 04/27/2015 FINAL  Final     Studies: Dg Chest 2 View  05/01/2015  CLINICAL DATA:  Weakness, loss of appetite, recent dx of pneumonia. Pts daughter stated thoracentesis performed also while admitted. History of A-FIB, coronary artery  disease EXAM: CHEST  2 VIEW COMPARISON:  05/01/2015 at 1647 hours FINDINGS: Opacity at the right lung base is similar to the earlier exam. This is consistent with combination of pleural fluid with either atelectasis or pneumonia or combination. There is central and lung base bronchial wall thickening that may reflect chronic bronchitic change. There is no convincing pulmonary edema. There is a small left pleural effusion. Cardiac silhouette  is mildly enlarged. No mediastinal or hilar masses or convincing adenopathy. Bony thorax is diffusely demineralized. IMPRESSION: 1. Right pleural effusion associated with right lung base opacity which may reflect atelectasis or pneumonia. Atelectasis is favored. 2. Small left pleural effusion. 3. No convincing pulmonary edema.  Mild cardiomegaly. Electronically Signed   By: Amie Portland M.D.   On: 05/01/2015 20:43   Dg Chest Port 1 View  05/01/2015  CLINICAL DATA:  81 year old who was discharged from the hospital 2 days ago with pneumonia, presenting back from the nursing home with generalized weakness and anorexia. EXAM: PORTABLE CHEST 1 VIEW COMPARISON:  04/27/2015 and earlier. FINDINGS: Cardiac silhouette moderately enlarged, unchanged. Elevation of the right hemidiaphragm with likely subpulmonic right pleural effusion, unchanged since the examination 5 days ago. Consolidation in the right lower lobe and right middle lobe, unchanged. No new pulmonary parenchymal abnormalities. IMPRESSION: 1. Likely subpulmonic right pleural effusion, unchanged since examination 5 days ago. 2. Associated dense atelectasis and/or pneumonia in the right lower lobe and right middle lobe, also unchanged. 3. No new abnormalities. Electronically Signed   By: Hulan Saas M.D.   On: 05/01/2015 17:05    Scheduled Meds: . acetaminophen  500 mg Oral Daily  . carvedilol  6.25 mg Oral BID WC  . cefTAZidime (FORTAZ)  IV  1 g Intravenous Q12H  . docusate sodium  200 mg Oral QHS  . enoxaparin  (LOVENOX) injection  40 mg Subcutaneous Q24H  . feeding supplement (ENSURE ENLIVE)  237 mL Oral BID  . levothyroxine  50 mcg Oral QAC breakfast  . senna-docusate  1 tablet Oral BID  . sodium chloride  3 mL Intravenous Q12H   Continuous Infusions:   Principal Problem:   Failure to thrive in adult Active Problems:   FIBRILLATION, ATRIAL   Anemia of chronic disease   HCAP (healthcare-associated pneumonia)   Pleural effusion on right   Hepatic congestion    Time spent: 25 minutes. Greater than 50% of this time was spent in direct contact with the patient coordinating care.    Chaya Jan  [name] Pager [phone]  If 7PM-7AM, please contact night-coverage at [email or web address], password [facility] 05/02/2015, 2:15 PM  LOS: 1 day

## 2015-05-02 NOTE — Progress Notes (Signed)
PT Cancellation Note  Patient Details Name: Kim EldersMary M [name] MRN: [id number] DOB: 13-Sep-1917   Cancelled Treatment:    Reason Eval/Treat Not Completed: Other (comment).  Pt was seen during last hospital stay very recently.  She has significant knee flexion contractures which make transfers or gait impossible.  She requires max/total assist to transfer to a chair.  Given this situation her rehab potential is poor. If the ACLF is unable to manage her she will need to transition to long term care nursing home.  PT is not going to significantly affect her mobility.  We will d/c orders.  I will be here all day Monday if you have any questions.   Myrlene BrokerBrown, Nicolle Heward L  PT 05/02/2015, 11:46 AM [phone]

## 2015-05-03 NOTE — Progress Notes (Signed)
[name] PROGRESS NOTE  Kim [name] BJY:[id number] DOB: 1918-03-28 DOA: 05/01/2015 PCP: Dwana Melena, MD  Assessment/Plan: Kim [name] -Likely related to recent infections in this elderly patient. -Discussed at length with family at bedside. -ALF is not able to meet her current needs, family requesting SNF. Explained that this would likely be long term and not for rehab. Have also explained the concept of hospice and palliative care and they are willing to be present for a consultation. Will request on Monday when available.  Recent HCAP -No need for IV abx. -Continue planned course of Augmentin for 2 more days.  Chronic Diastolic CHF -Do not believe she has acute CHF.  Atrial Fibrillation -Rates in the 120s currently. -Continue coreg.  Hypothyroidism -Continue synthroid.  Elevated Troponin -Flat. -Of uncertain value, but no further work up in this elderly patient (discussed with family at bedside).  Code Status: DNR Family Communication: Daughter and son in law at bedside  Disposition Plan: ALF vs SNF with hospice services   Consultants:  None   Antibiotics:  Augmentin   Subjective: Sleeping, arouses to voice has no complaints.  Objective: Filed Vitals:   05/02/15 0639 05/02/15 1246 05/02/15 2317 05/03/15 0532  BP: 111/68 114/74 102/62 103/67  Pulse: 118 126 114 128  Temp: 98.3 F (36.8 C) 98 F (36.7 C) 98.8 F (37.1 C) 98.8 F (37.1 C)  TempSrc: Oral Oral Oral Oral  Resp: Height:      Weight:      SpO2: 99% 98% 99% 97%    Intake/Output Summary (Last 24 hours) at 05/03/15 1130 Last data filed at 05/02/15 1200  Gross per 24 hour  Intake      0 ml  Output      0 ml  Net      0 ml   Filed Weights   05/01/15 1553 05/01/15 2107  Weight: 66.679 kg (147 lb) 63.504 kg (140 lb)    Exam:   General:  awake  Cardiovascular: tachy, irregular  Respiratory: CTA B  Abdomen: S/NT/ND/+BS  Extremities: no C/C/E,  contractures   Neurologic:  Moves all 4 spontaneously  Data Reviewed: Basic Metabolic Panel:  Recent Labs Lab 04/27/15 0503 04/28/15 0523 04/29/15 0458 05/01/15 1605 05/02/15 0617  NA 134* 135 134* 138 140  K 4.5 4.2 3.9 4.1 3.5  CL 104 103 102 103 103  CO2 GLUCOSE 106* 119* 141* 82 85  BUN 31* 32* 34* 30* 27*  CREATININE 0.81 0.82 0.67 0.69 0.70  CALCIUM 8.1* 8.3* 8.1* 8.4* 8.2*   Liver Function Tests:  Recent Labs Lab 04/26/15 1139 04/27/15 0503 04/28/15 0523 04/29/15 0458 05/02/15 0617  AST 413* 377* 199* 101* 45*  ALT 223* 241* 190* 143* 66*  ALKPHOS 102 102 93 86 81  BILITOT 1.6* 1.5* 1.4* 1.1 2.1*  PROT 6.7 5.8* 5.8* 5.6* 5.8*  ALBUMIN 3.1* 2.7* 2.5* 2.4* 2.6*   No results for input(s): LIPASE, AMYLASE in the last 168 hours. No results for input(s): AMMONIA in the last 168 hours. CBC:  Recent Labs Lab 04/26/15 1139 04/27/15 0503 04/28/15 0523 05/01/15 1605 05/02/15 0617  WBC 14.6* 15.9* 10.0 11.2* 10.6*  NEUTROABS  --   --   --  8.3*  --   HGB 10.5* 10.4* 11.0* 12.4 13.3  HCT 33.6* 33.9* 36.8 39.7 41.6  MCV 103.1* 102.1* 102.8* 105.0* 104.8*  PLT 245 212 201 154 146*   Cardiac Enzymes:  Recent Labs Lab 04/26/15 1139 05/01/15 1605 05/02/15 0617  TROPONINI 0.03 0.04* 0.03   BNP (last 3 results)  Recent Labs  04/12/15 2020 04/26/15 1139 05/01/15 1605  BNP 473.0* 547.0* 602.0*    ProBNP (last 3 results) No results for input(s): PROBNP in the last 8760 hours.  CBG: No results for input(s): GLUCAP in the last 168 hours.  Recent Results (from the past 240 hour(s))  Urine culture     Status: None   Collection Time: 04/26/15 12:57 PM  Result Value Ref Range Status   Specimen Description URINE, CATHETERIZED  Final   Special Requests NONE  Final   Culture   Final    NO GROWTH 1 DAY Per[facility]    Report Status 04/27/2015 FINAL  Final  MRSA PCR Screening     Status: None   Collection Time:  04/26/15  6:00 PM  Result Value Ref Range Status   MRSA by PCR NEGATIVE NEGATIVE Final    Comment:        The GeneXpert MRSA Assay (FDA approved for NASAL specimens only), is one component of a comprehensive MRSA colonization surveillance program. It is not intended to diagnose MRSA infection nor to guide or monitor treatment for MRSA infections.   Culture, blood (routine x 2) Call MD if unable to obtain prior to antibiotics being given     Status: None   Collection Time: 04/26/15  9:10 PM  Result Value Ref Range Status   Specimen Description BLOOD LEFT HAND  Final   Special Requests BOTTLES DRAWN AEROBIC AND ANAEROBIC 7CC  Final   Culture NO GROWTH 5 DAYS  Final   Report Status 05/01/2015 FINAL  Final  Culture, blood (routine x 2) Call MD if unable to obtain prior to antibiotics being given     Status: None   Collection Time: 04/26/15  9:15 PM  Result Value Ref Range Status   Specimen Description BLOOD LEFT HAND  Final   Special Requests BOTTLES DRAWN AEROBIC AND ANAEROBIC 6CC  Final   Culture NO GROWTH 5 DAYS  Final   Report Status 05/01/2015 FINAL  Final  Culture, body fluid-bottle     Status: None   Collection Time: 04/27/15 12:55 PM  Result Value Ref Range Status   Specimen Description FLUID RIGHT PLEURAL  Final   Special Requests BOTTLES DRAWN AEROBIC AND ANAEROBIC 10CC EACH  Final   Culture NO GROWTH 5 DAYS  Final   Report Status 05/02/2015 FINAL  Final  Gram stain     Status: None   Collection Time: 04/27/15 12:55 PM  Result Value Ref Range Status   Specimen Description FLUID RIGHT PLEURAL  Final   Special Requests NONE  Final   Gram Stain   Final    FEW WBC PRESENT, PREDOMINANTLY MONONUCLEAR NO ORGANISMS SEEN Performed at [facility]    Report Status 04/27/2015 FINAL  Final     Studies: Dg Chest 2 View  05/01/2015  CLINICAL DATA:  Weakness, loss of appetite, recent dx of pneumonia. Pts daughter stated thoracentesis performed also while admitted.  History of A-FIB, coronary artery disease EXAM: CHEST  2 VIEW COMPARISON:  05/01/2015 at 1647 hours FINDINGS: Opacity at the right lung base is similar to the earlier exam. This is consistent with combination of pleural fluid with either atelectasis or pneumonia or combination. There is central and lung base bronchial wall thickening that may reflect chronic bronchitic change. There is no convincing pulmonary edema. There is  a small left pleural effusion. Cardiac silhouette is mildly enlarged. No mediastinal or hilar masses or convincing adenopathy. Bony thorax is diffusely demineralized. IMPRESSION: 1. Right pleural effusion associated with right lung base opacity which may reflect atelectasis or pneumonia. Atelectasis is favored. 2. Small left pleural effusion. 3. No convincing pulmonary edema.  Mild cardiomegaly. Electronically Signed   By: David  OrmonAmie Portlandn: 05/01/2015 20:43   Dg Chest Port 1 View  05/01/2015  CLINICAL DATA:  81 year old who was discharged from the hospital 2 days ago with pneumonia, presenting back from the nursing home with generalized weakness and anorexia. EXAM: PORTABLE CHEST 1 VIEW COMPARISON:  04/27/2015 and earlier. FINDINGS: Cardiac silhouette moderately enlarged, unchanged. Elevation of the right hemidiaphragm with likely subpulmonic right pleural effusion, unchanged since the examination 5 days ago. Consolidation in the right lower lobe and right middle lobe, unchanged. No new pulmonary parenchymal abnormalities. IMPRESSION: 1. Likely subpulmonic right pleural effusion, unchanged since examination 5 days ago. 2. Associated dense atelectasis and/or pneumonia in the right lower lobe and right middle lobe, also unchanged. 3. No new abnormalities. Electronically Signed   By: Hulan Saas M.D.   On: 05/01/2015 17:05    Scheduled Meds: . acetaminophen  500 mg Oral Daily  . amoxicillin-clavulanate  1 tablet Oral 3 times per day  . carvedilol  6.25 mg Oral BID WC  .  docusate sodium  200 mg Oral QHS  . enoxaparin (LOVENOX) injection  40 mg Subcutaneous Q24H  . feeding supplement (ENSURE ENLIVE)  237 mL Oral BID  . levothyroxine  50 mcg Oral QAC breakfast  . senna-docusate  1 tablet Oral BID  . sodium chloride  3 mL Intravenous Q12H   Continuous Infusions:   Principal Problem:   Failure to thrive in adult Active Problems:   FIBRILLATION, ATRIAL   Anemia of chronic disease   HCAP (healthcare-associated pneumonia)   Pleural effusion on right   Hepatic congestion    Time spent: 25 minutes. Greater than 50% of this time was spent in direct contact with the patient coordinating care.    Chaya Jan  [name] Pager [phone]  If 7PM-7AM, please contact night-coverage at [email or web address], password [email or web address] 05/03/2015, 11:30 AM  LOS: 2 days

## 2015-05-04 DIAGNOSIS — R627 Adult failure to thrive: Secondary | ICD-10-CM

## 2015-05-04 DIAGNOSIS — Z515 Encounter for palliative care: Secondary | ICD-10-CM

## 2015-05-04 DIAGNOSIS — Z7189 Other specified counseling: Secondary | ICD-10-CM | POA: Insufficient documentation

## 2015-05-04 DIAGNOSIS — J189 Pneumonia, unspecified organism: Principal | ICD-10-CM

## 2015-05-04 MED ORDER — HYDROCODONE-ACETAMINOPHEN 5-325 MG PO TABS: 1.0000 | ORAL_TABLET | Freq: Two times a day (BID) | ORAL | Status: AC | PRN

## 2015-05-04 MED ORDER — CARVEDILOL 12.5 MG PO TABS
12.5000 mg | ORAL_TABLET | Freq: Two times a day (BID) | ORAL | Status: DC
  Administered 2015-05-04: 12.5 mg via ORAL

## 2015-05-04 MED ORDER — CARVEDILOL 12.5 MG PO TABS: 12.5000 mg | ORAL_TABLET | Freq: Two times a day (BID) | ORAL | Status: AC

## 2015-05-04 MED ORDER — AMOXICILLIN-POT CLAVULANATE 500-125 MG PO TABS: 1.0000 | ORAL_TABLET | Freq: Three times a day (TID) | ORAL | Status: AC

## 2015-05-04 NOTE — Consult Note (Signed)
Consultation Note Date: 05/04/2015   Kim [name] [name] Name: Kim [name] Kim [name] [name]  DOB: [birth year]  MRN: [id number]  Age / Sex: 81 y.o., female   PCP: Kim [name] Stabile, MD Referring Physician: Cathren Harsh, MD  Reason for Consultation: Establishing goals of care and Psychosocial/spiritual support  Palliative Care Assessment and Plan Summary of Established Goals of Care and Medical Treatment Preferences   Clinical Assessment/Narrative: Kim [name] Kim [name] [name] is resting quietly in bed, she tells me she just wants to sleep.  Family meeting with son, Kim [name] Kim [name] [name], daughter and son in law Kim [name] Kim [name] [name] and Kim [name] Kim [name] [name].  We talk about her current health issues and her desire to go home and be made comfortable.  I share Kim [name] chronic illness trajectory, and we talk about Kim [name] concepts of do not re- hospitalize, do not treat Kim [name] next infection.  Family tells me that their desire is for Kim [name] Kim [name] [name] to return to [name] with [name].  We talk about Kim [name] changes that occur as we near end of life, sleeping more, eating less, interacting less. They tell me she is doing all these.  We discuss comfort care and Kim [name] family tells me this is their goal.   Contacts/Participants in Discussion: Primary Decision Maker: Kim [name] Kim [name] [name] relies on her children to help her make decisions.    HCPOA: Son and daughter, Kim [name] Kim [name] [name] and Kim [name] Kim [name] [name].   Code Status/Advance Care Planning:  DNR  Symptom Management:   Tylenol 500 mg PO QD  Hydrocodone 5/325 BID PRN.   Palliative Prophylaxis: Colace 200 mg PO QHS ; Senna-S BID   Psycho-social/Spiritual:   Support System: Lives at [name] for 1 year.  Son, daughter and son in law active with care.   Desire for further Chaplaincy support:DC today.   Prognosis: > 6 weeks  Discharge Planning:  Skilled Nursing Facility with Hospice  Kim [name] Kim [name] with [name]        Chief Complaint:  Fatigue, confusion History of Present Illness:  Kim [name] Kim [name] [name] is a 81 y.o. female with  a past medical history significant for Afib not on warfarin, hypothyroidism, and recent UTI and then HCAP discharged to [name] two days ago who presents with decreased energy and failure to thrive at home.  Kim [name] Kim [name] [name] was admitted in mid October for UTI, discharged on levofloxacin back to her [name]. One week ago, she was readmitted with RLL opacity and parapneumonic effusion, was on vancomycin and ceftazidime, underwent thoracentesis of 1L fluid (appears exudative with negative culture at 5d), and was discharged back to [name] with amoxicillin-clavulanate for five more days.  Kim [name] following history was collected from daughter, Kim [name] Kim [name] [name], by telephone: Kim [name] Kim [name] [name] returned to [name] on Wednesday, ate dinner in dining room, but had reduced appetite and very low energy. She is normally able to wheel herself around in her wheelchair, but was unable to do this Wednesday. On Thursday, Kim [name] Kim [name] [name] talked with her by phone and said she was "not talkative" and perhaps confused. Tonight, she was sent back to ER by [name], and daughter found her profoundly weak and unable to maintain conversation.   In [name] ED, she was tachycardic in Afib, had elevated BUN-creatinine ratio, stable WBC, and unchanged pleural effusion. She was able to answer questions, but very weak, sluggish and couldn't remember where she'd been for Kim [name] last two days. She reported right sided chest discomfort but no dyspnea or fever or cough. There was minimal TNI elevation. She was given 2L NS and TRH were asked to admit for failure  to thrive at home in context of HCAP.     Primary Diagnoses  Present on Admission:  . FIBRILLATION, ATRIAL . HCAP (healthcare-associated pneumonia) . Pleural effusion on right . Anemia of chronic disease . Hepatic congestion . Failure to thrive in adult  Palliative Review of Systems: Kim [name] Kim [name] [name] denies pain, anxiety, dyspnea.  I have reviewed Kim [name] medical record, interviewed Kim [name] Kim [name] [name] and family, and examined Kim [name]  Kim [name] [name]. Kim [name] following aspects are pertinent.  Past Medical History  Diagnosis Date  . Atrial fibrillation (HCC)   . Osteoporosis     Knee; hip  . Wrist fracture 1988    Left  . Detached retina, right   . Chronic anticoagulation   . Compression fracture of thoracic spine, non-traumatic (HCC)   . Coronary artery disease   . DNR (do not resuscitate)   . CHF (congestive heart failure) (HCC)   . Hypothyroidism    Social History   Social History  . Marital Status: Widowed    Spouse Name: N/A  . Number of Children: N/A  . Years of Education: 12   Occupational History  . retail sales     retired  .     Social History Main Topics  . Smoking status: Never Smoker   . Smokeless tobacco: Never Used  . Alcohol Use: No  . Drug Use: No  . Sexual Activity: No   Other Topics Concern  . None   Social History Narrative   Family History  Problem Relation Age of Onset  . Heart disease Father     deceased age 49  . Heart attack Mother   . Arthritis     Scheduled Meds: . acetaminophen  500 mg Oral Daily  . amoxicillin-clavulanate  1 tablet Oral 3 times per day  . carvedilol  12.5 mg Oral BID WC  . docusate sodium  200 mg Oral QHS  . enoxaparin (LOVENOX) injection  40 mg Subcutaneous Q24H  . feeding supplement (ENSURE ENLIVE)  237 mL Oral BID  . levothyroxine  50 mcg Oral QAC breakfast  . senna-docusate  1 tablet Oral BID  . sodium chloride  3 mL Intravenous Q12H   Continuous Infusions:  PRN Meds:.traZODone Medications Prior to Admission:  Prior to Admission medications   Medication Sig Start Date End Date Taking? Authorizing Provider  acetaminophen (TYLENOL) 500 MG tablet Take 500 mg by mouth daily.   Yes Historical Provider, MD  amoxicillin-clavulanate (AUGMENTIN) 500-125 MG tablet Take 1 tablet (500 mg total) by mouth 2 (two) times daily. 04/29/15  Yes Kim [name] Isaiah Blakes, MD  ARTIFICIAL TEAR OP Place 1 drop into both eyes 2 (two) times daily.   Yes Historical  Provider, MD  Ascorbic Acid (VITAMIN C) 500 MG tablet Take 500 mg by mouth daily.     Yes Historical Provider, MD  Calcium Carbonate (CALTRATE 600 PO) Take 1 capsule by mouth every morning.    Yes Historical Provider, MD  carvedilol (COREG) 6.25 MG tablet Take 1 tablet (6.25 mg total) by mouth 2 (two) times daily with a meal. 04/29/15  Yes Kim [name] Isaiah Blakes, MD  cetirizine (ZYRTEC) 10 MG tablet Take 10 mg by mouth daily.   Yes Historical Provider, MD  docusate sodium 100 MG CAPS Take 100 mg by mouth 2 (two) times daily. Kim [name] [name] taking differently: Take 200 mg by mouth at bedtime.  10/29/13  Yes Christiane Ha, MD  ENSURE (ENSURE) Take 237 mLs by mouth 2 (two) times daily. Strawberry flavored  Yes Historical Provider, MD  fluticasone (FLONASE) 50 MCG/ACT nasal spray Place 1 spray into both nostrils 2 (two) times daily.    Yes Historical Provider, MD  gabapentin (NEURONTIN) 100 MG capsule Take 2 capsules (200 mg total) by mouth 3 (three) times daily. 10/29/13  Yes Christiane Ha, MD  levothyroxine (SYNTHROID, LEVOTHROID) 50 MCG tablet Take 50 mcg by mouth daily before breakfast.   Yes Historical Provider, MD  Multiple Vitamins-Minerals (MULTIVITAMIN WITH MINERALS) tablet Take 1 tablet by mouth every morning.    Yes Historical Provider, MD  Olopatadine HCl (PATADAY) 0.2 % SOLN Apply 1 drop to eye daily.   Yes Historical Provider, MD  ondansetron (ZOFRAN) 4 MG tablet Take 1 tablet (4 mg total) by mouth every 6 (six) hours as needed for nausea. 10/29/13  Yes Christiane Ha, MD  PROAIR HFA 108 (90 BASE) MCG/ACT inhaler Inhale 2 puffs into Kim [name] lungs 3 (three) times daily. 04/15/15  Yes Historical Provider, MD  senna-docusate (SENEXON-S) 8.6-50 MG tablet Take 1 tablet by mouth 2 (two) times daily.   Yes Historical Provider, MD  traZODone (DESYREL) 50 MG tablet Take 25 mg by mouth at bedtime as needed for sleep.   Yes Historical Provider, MD  amoxicillin-clavulanate (AUGMENTIN) 500-125 MG  tablet Take 1 tablet (500 mg total) by mouth every 8 (eight) hours. X 2 more doses 05/04/15   Ripudeep Jenna Luo, MD  carvedilol (COREG) 12.5 MG tablet Take 1 tablet (12.5 mg total) by mouth 2 (two) times daily with a meal. 05/04/15   Ripudeep Jenna Luo, MD  HYDROcodone-acetaminophen (NORCO/VICODIN) 5-325 MG tablet Take 1 tablet by mouth every 12 (twelve) hours as needed for severe pain. 05/04/15   Ripudeep Jenna Luo, MD   Allergies  Allergen Reactions  . Nitrofurantoin Rash   CBC:    Component Value Date/Time   WBC 10.6* 05/02/2015 0617   HGB 13.3 05/02/2015 0617   HCT 41.6 05/02/2015 0617   PLT 146* 05/02/2015 0617   MCV 104.8* 05/02/2015 0617   NEUTROABS 8.3* 05/01/2015 1605   LYMPHSABS 1.6 05/01/2015 1605   MONOABS 1.3* 05/01/2015 1605   EOSABS 0.1 05/01/2015 1605   BASOSABS 0.0 05/01/2015 1605   Comprehensive Metabolic Panel:    Component Value Date/Time   NA 140 05/02/2015 0617   K 3.5 05/02/2015 0617   CL 103 05/02/2015 0617   CO2 27 05/02/2015 0617   BUN 27* 05/02/2015 0617   CREATININE 0.70 05/02/2015 0617   CREATININE 0.78 10/14/2010 1442   GLUCOSE 85 05/02/2015 0617   CALCIUM 8.2* 05/02/2015 0617   AST 45* 05/02/2015 0617   ALT 66* 05/02/2015 0617   ALKPHOS 81 05/02/2015 0617   BILITOT 2.1* 05/02/2015 0617   PROT 5.8* 05/02/2015 0617   ALBUMIN 2.6* 05/02/2015 0617    Physical Exam: Vital Signs: BP 158/95 mmHg  Pulse 108  Temp(Src) 98 F (36.7 C) (Oral)  Resp 16  Ht 5' (1.524 m)  Wt 63.504 kg (140 lb)  BMI 27.34 kg/m2  SpO2 91% SpO2: SpO2: 91 % O2 Device: O2 Device: Not Delivered O2 Flow Rate:   Intake/output summary: No intake or output data in Kim [name] 24 hours ending 05/04/15 1334 LBM:   Baseline Weight: Weight: 66.679 kg (147 lb) Most recent weight: Weight: 63.504 kg (140 lb)  Exam Findings:  Constitutional:  Elderly, lying in bed with eyes closed,  Will open eye when asked.   Resp:  Even and non labored Cardio:  No edema.  Psych:  Keeps eyes  closed unless  spoken to, smiles at me as I leave.          Palliative Performance Scale: 30%              Additional Data Reviewed: Recent Labs     05/01/15  1605  05/02/15  0617  WBC  11.2*  10.6*  HGB  12.4  13.3  PLT  154  146*  NA  138  140  BUN  30*  27*  CREATININE  0.69  0.70     Time In: 1245 Time Out: 1340 Time Total: 55 minutes Greater than 50%  of this time was spent counseling and coordinating care related to Kim [name] above assessment and plan.  Signed by: Katheran Aweove,Tasha A, NP  Katheran Aweasha A Dove, NP  05/04/2015, 1:34 PM  Please contact Palliative Medicine Team phone at [phone] for questions and concerns.

## 2015-05-04 NOTE — Clinical Social Work Note (Signed)
[facility]. Facility will arrange hospice. Pt's son aware and agreeable. [facility] to transport [facility]. D/C summary and FL2 sent to facility.  Derenda FennelKara Niala Stcharles, LCSW [phone]

## 2015-05-04 NOTE — Care Management Important Message (Signed)
Important Message  Patient Details  Name: Kim EldersMary M [name] MRN: [id number] Date of Birth: [birth year]   Medicare Important Message Given:  Yes-second notification given    Malcolm MetroChildress, Sharhonda Atwood Demske, RN 05/04/2015, 1:49 PM

## 2015-05-04 NOTE — NC FL2 (Signed)
[place] MEDICAID FL2 LEVEL OF CARE SCREENING TOOL     IDENTIFICATION  Patient Name: Kim [name] Birthdate: 08/27/1917 Sex: female Admission Date (Current Location): 05/01/2015  [facility] and [place] Number:  [name] )   Facility and Address:  [facility],  [address], [place] [address]      Provider Number: [id number]  Attending Physician Name and Address:  Cathren Harsh, MD  Relative Name and Phone Number:       Current Level of Care: Hospital Recommended Level of Care: Assisted Living Facility [facility] with [facility]) Prior Approval Number:    Date Approved/Denied:   PASRR Number: [id number]  Discharge Plan: Other (Comment)    Current Diagnoses: Patient Active Problem List   Diagnosis Date Noted  . Failure to thrive in adult 05/01/2015  . Transaminitis 05/01/2015  . Hepatic congestion 05/01/2015  . HCAP (healthcare-associated pneumonia) 04/26/2015  . Pleural effusion on right 04/26/2015  . Hyponatremia 04/26/2015  . Sepsis (HCC) 04/13/2015  . Anemia of chronic disease 04/13/2015  . UTI (urinary tract infection) 04/12/2015  . URI, acute 04/12/2015  . Cellulitis of left lower extremity 04/18/2014  . Cellulitis and abscess of leg, except foot 04/18/2014  . Femur fracture (HCC) 02/04/2014  . Unspecified constipation 02/03/2014  . Unspecified hereditary and idiopathic peripheral neuropathy 02/03/2014  . S/p left hip fracture 11/12/2013  . Herniated disc 11/12/2013  . Pressure ulcer, heel(707.07) 10/30/2013  . Right leg weakness 10/30/2013  . Acute blood loss anemia 10/26/2013  . Thrombocytopenia, unspecified (HCC) 10/26/2013  . Right hip pain 10/26/2013  . Subtrochanteric fracture of left femur (HCC) 10/25/2013  . Normocytic anemia 10/23/2013  . Femur fracture, left (HCC) 10/22/2013  . Fracture of hip, left, closed (HCC) 10/22/2013  . Knee pain, right 02/01/2012  . Unsteady gait 10/14/2010  . FIBRILLATION, ATRIAL  08/18/2006  . DETACHMENT, RETINAL NOS 05/22/2006  . OSTEOPOROSIS 05/22/2006  . Fracture, thoracic vertebra, compression (HCC) 05/22/2006    Orientation ACTIVITIES/SOCIAL BLADDER RESPIRATION   self, place    Family supportive Incontinent Normal  BEHAVIORAL SYMPTOMS/MOOD NEUROLOGICAL BOWEL NUTRITION STATUS      Incontinent Diet (Heart healthy)  PHYSICIAN VISITS COMMUNICATION OF NEEDS Height & Weight Skin    Verbally 5' (152.4 cm) 140 lbs. Normal          AMBULATORY STATUS RESPIRATION     (non ambulatory) Normal      Personal Care Assistance Level of Assistance  Feeding, Dressing, Bathing Bathing Assistance: Maximum assistance Feeding assistance: Maximum assistance Dressing Assistance: Maximum assistance      Functional Limitations Info  Sight Sight Info: Impaired           SPECIAL CARE FACTORS FREQUENCY   (Hospice to be set up by facility)                   Additional Factors Info  Code Status, Allergies Code Status Info: DNR Allergies Info:  (Nitrofurantoin)           Current Medications (05/04/2015): Current Facility-Administered Medications  Medication Dose Route Frequency Provider Last Rate Last Dose  . acetaminophen (TYLENOL) tablet 500 mg  500 mg Oral Daily Alberteen Sam, MD   500 mg at 05/04/15 0932  . amoxicillin-clavulanate (AUGMENTIN) 500-125 MG per tablet 500 mg  1 tablet Oral 3 times per day Henderson Cloud, MD   500 mg at 05/04/15 0520  . carvedilol (COREG) tablet 12.5 mg  12.5 mg Oral BID WC Ripudeep K  Rai, MD   12.5 mg at 05/04/15 0931  . docusate sodium (COLACE) capsule 200 mg  200 mg Oral QHS Alberteen Sam, MD   200 mg at 05/03/15 2145  . enoxaparin (LOVENOX) injection 40 mg  40 mg Subcutaneous Q24H Alberteen Sam, MD   40 mg at 05/03/15 2146  . feeding supplement (ENSURE ENLIVE) (ENSURE ENLIVE) liquid 237 mL  237 mL Oral BID Alberteen Sam, MD   237 mL at 05/04/15 0934  . levothyroxine (SYNTHROID,  LEVOTHROID) tablet 50 mcg  50 mcg Oral QAC breakfast Alberteen Sam, MD   50 mcg at 05/04/15 0932  . senna-docusate (Senokot-S) tablet 1 tablet  1 tablet Oral BID Alberteen Sam, MD   1 tablet at 05/04/15 0932  . sodium chloride 0.9 % injection 3 mL  3 mL Intravenous Q12H Alberteen Sam, MD   3 mL at 05/04/15 0938  . traZODone (DESYREL) tablet 25 mg  25 mg Oral QHS PRN Alberteen Sam, MD       Do not use this list as official medication orders. Please verify with discharge summary.  Discharge Medications:   Medication List    STOP taking these medications        gabapentin 100 MG capsule  Commonly known as:  NEURONTIN      TAKE these medications        acetaminophen 500 MG tablet  Commonly known as:  TYLENOL  Take 500 mg by mouth daily.     amoxicillin-clavulanate 500-125 MG tablet  Commonly known as:  AUGMENTIN  Take 1 tablet (500 mg total) by mouth every 8 (eight) hours. X 2 more doses     ARTIFICIAL TEAR OP  Place 1 drop into both eyes 2 (two) times daily.     CALTRATE 600 PO  Take 1 capsule by mouth every morning.     carvedilol 12.5 MG tablet  Commonly known as:  COREG  Take 1 tablet (12.5 mg total) by mouth 2 (two) times daily with a meal.     cetirizine 10 MG tablet  Commonly known as:  ZYRTEC  Take 10 mg by mouth daily.     DSS 100 MG Caps  Take 100 mg by mouth 2 (two) times daily.     ENSURE  Take 237 mLs by mouth 2 (two) times daily. Strawberry flavored     fluticasone 50 MCG/ACT nasal spray  Commonly known as:  FLONASE  Place 1 spray into both nostrils 2 (two) times daily.     HYDROcodone-acetaminophen 5-325 MG tablet  Commonly known as:  NORCO/VICODIN  Take 1 tablet by mouth every 12 (twelve) hours as needed for severe pain.     levothyroxine 50 MCG tablet  Commonly known as:  SYNTHROID, LEVOTHROID  Take 50 mcg by mouth daily before breakfast.     multivitamin with minerals tablet  Take 1 tablet by mouth every  morning.     ondansetron 4 MG tablet  Commonly known as:  ZOFRAN  Take 1 tablet (4 mg total) by mouth every 6 (six) hours as needed for nausea.     PATADAY 0.2 % Soln  Generic drug:  Olopatadine HCl  Apply 1 drop to eye daily.     PROAIR HFA 108 (90 BASE) MCG/ACT inhaler  Generic drug:  albuterol  Inhale 2 puffs into the lungs 3 (three) times daily.     SENEXON-S 8.6-50 MG tablet  Generic drug:  senna-docusate  Take 1 tablet by  mouth 2 (two) times daily.     traZODone 50 MG tablet  Commonly known as:  DESYREL  Take 25 mg by mouth at bedtime as needed for sleep.     vitamin C 500 MG tablet  Commonly known as:  ASCORBIC ACID  Take 500 mg by mouth daily.        Relevant Imaging Results:  Relevant Lab Results:  Recent Labs    Additional Information    Karn CassisStultz, Earlene Bjelland [place], [place] [phone]

## 2015-05-04 NOTE — Progress Notes (Signed)
[facility]                                                                              Patient Demographics  Kim [name], is a 81 y.o. female, DOB - 12-Apr-1918, ZOX:[id number]  Admit date - 05/01/2015   Admitting Physician Alberteen Sam, MD  Outpatient Primary MD for the patient is Dwana Melena, MD  LOS - 3   Chief Complaint  Patient presents with  . Weakness       Brief HPI   Per Dr. Sheryn Bison admit note, Kim [name] is a 81 y.o. female with a past medical history significant for Afib not on warfarin, hypothyroidism, and recent UTI and then HCAP discharged to ALF two days prior to admission presented with failure to thrive at home/ALF. The patient was admitted in mid October for UTI, discharged on levofloxacin back to her ALF. One week ago, she was readmitted with RLL opacity and parapneumonic effusion, was on vancomycin and ceftazidime, underwent thoracentesis of 1L fluid (appears exudative with negative culture at 5d), and was discharged back to ALF with amoxicillin-clavulanate for five more days. Per patient's daughter, Kim [name], by telephone: the patient returned to ALF on Wednesday, ate dinner in dining room, but had reduced appetite and very low energy. She is normally able to wheel herself around in her wheelchair, but was unable to do this Wednesday. On Thursday, Okey Regal talked with her by phone and said she was "not talkative" and perhaps confused. Patient's daughter found her profoundly weak and unable to maintain conversation on the day of admission and patient was sent back to the ER.  In the ED, she was tachycardic in Afib, had elevated BUN-creatinine ratio, stable WBC, and unchanged pleural effusion. She was able to answer questions, but very weak, sluggish and couldn't remember where she'd been for the last two days. She reported right sided chest discomfort but no dyspnea or fever or cough. There was minimal TNI elevation. She was  given 2L NS and TRH were asked to admit for failure to thrive at home in context of HCAP.  Assessment & Plan   Weakness/FTT -Likely related to recent infections in this elderly patient. - Patient was seen by physical therapy and noted significant knee flexion contractures which makes transfers or gait impossible, patient is a max/total assist to transfer to a chair, given the situation rehabilitation potential is poor, patient will need to transition to long-term care nursing home if ALF unable to manage her needs. -ALF is not able to meet her current needs, family requesting SNF. Discussed with social work. - Palliative care also consulted for goals of care  Recent HCAP -No need for IV abx. -Continue planned course of Augmentin, will finish today   Chronic Diastolic CHF - currently compensated, I's and O's euvolemic  Atrial Fibrillation -Rates in the low 100s, increase Coreg -Not a candidate for anticoagulation due to risk of falls, failure to thrive   Hypothyroidism -Continue synthroid.  Elevated Troponin -Flat, -Of uncertain value, but no further work up in this elderly patient (Dr Ardyth Harps discussed with family at bedside).  Code Status:DNR  Family Communication: Discussed  in detail with the patient, all imaging results, lab results explained to the patient. No family member at the bedside.   Disposition Plan: Possible DC to skilled nursing facility today, social work working on the facility  Time Spent in minutes 25  minutes  Procedures  None  Consults   None  DVT Prophylaxis Lovenox  Medications  Scheduled Meds: . acetaminophen  500 mg Oral Daily  . amoxicillin-clavulanate  1 tablet Oral 3 times per day  . carvedilol  12.5 mg Oral BID WC  . docusate sodium  200 mg Oral QHS  . enoxaparin (LOVENOX) injection  40 mg Subcutaneous Q24H  . feeding supplement (ENSURE ENLIVE)  237 mL Oral BID  . levothyroxine  50 mcg Oral QAC breakfast  . senna-docusate  1 tablet Oral  BID  . sodium chloride  3 mL Intravenous Q12H   Continuous Infusions:  PRN Meds:.traZODone   Antibiotics   Anti-infectives    Start     Dose/Rate Route Frequency Ordered Stop   05/02/15 1445  amoxicillin-clavulanate (AUGMENTIN) 500-125 MG per tablet 500 mg     1 tablet Oral 3 times per day 05/02/15 1436 05/05/15 1359   05/02/15 1000  cefTAZidime (FORTAZ) 1 g in dextrose 5 % 50 mL IVPB  Status:  Discontinued     1 g 100 mL/hr over 30 Minutes Intravenous Every 12 hours 05/01/15 2022 05/02/15 1436   05/01/15 2030  cefTAZidime (FORTAZ) 2 g in dextrose 5 % 50 mL IVPB     2 g 100 mL/hr over 30 Minutes Intravenous  Once 05/01/15 2022 05/02/15 0134        Subjective:   Kim [name] was seen and examined today.  Patient denies dizziness, chest pain, shortness of breath, abdominal pain, N/V/D/C, new weakness, numbess, tingling. No acute events overnight.    Objective:   Blood pressure 158/95, pulse 108, temperature 98 F (36.7 C), temperature source Oral, resp. rate 16, height 5' (1.524 m), weight 63.504 kg (140 lb), SpO2 91 %.  Wt Readings from Last 3 Encounters:  05/01/15 63.504 kg (140 lb)  04/29/15 66.7 kg (147 lb 0.8 oz)  04/13/15 64 kg (141 lb 1.5 oz)    No intake or output data in the 24 hours ending 05/04/15 1000  Exam  General: Alert and oriented x 2, NAD  HEENT:  PERRLA, EOMI, Anicteric Sclera, mucous membranes moist.   Neck: Supple, no JVD, no masses  CVS: S1 S2 auscultated, irregularly irregular   Respiratory: Clear to auscultation bilaterally, no wheezing, rales or rhonchi  Abdomen: Soft, nontender, nondistended, + bowel sounds  Ext: no cyanosis clubbing or edema  Neuro: no new deficits  Skin: No rashes  Psych: Normal affect and demeanor, alert and oriented x2   Data Review   Micro Results Recent Results (from the past 240 hour(s))  Urine culture     Status: None   Collection Time: 04/26/15 12:57 PM  Result Value Ref Range Status   Specimen  Description URINE, CATHETERIZED  Final   Special Requests NONE  Final   Culture   Final    NO GROWTH 1 DAY Performed at [facility]    Report Status 04/27/2015 FINAL  Final  MRSA PCR Screening     Status: None   Collection Time: 04/26/15  6:00 PM  Result Value Ref Range Status   MRSA by PCR NEGATIVE NEGATIVE Final    Comment:        The GeneXpert MRSA Assay (FDA approved for  NASAL specimens only), is one component of a comprehensive MRSA colonization surveillance program. It is not intended to diagnose MRSA infection nor to guide or monitor treatment for MRSA infections.   Culture, blood (routine x 2) Call MD if unable to obtain prior to antibiotics being given     Status: None   Collection Time: 04/26/15  9:10 PM  Result Value Ref Range Status   Specimen Description BLOOD LEFT HAND  Final   Special Requests BOTTLES DRAWN AEROBIC AND ANAEROBIC 7CC  Final   Culture NO GROWTH 5 DAYS  Final   Report Status 05/01/2015 FINAL  Final  Culture, blood (routine x 2) Call MD if unable to obtain prior to antibiotics being given     Status: None   Collection Time: 04/26/15  9:15 PM  Result Value Ref Range Status   Specimen Description BLOOD LEFT HAND  Final   Special Requests BOTTLES DRAWN AEROBIC AND ANAEROBIC 6CC  Final   Culture NO GROWTH 5 DAYS  Final   Report Status 05/01/2015 FINAL  Final  Culture, body fluid-bottle     Status: None   Collection Time: 04/27/15 12:55 PM  Result Value Ref Range Status   Specimen Description FLUID RIGHT PLEURAL  Final   Special Requests BOTTLES DRAWN AEROBIC AND ANAEROBIC 10CC EACH  Final   Culture NO GROWTH 5 DAYS  Final   Report Status 05/02/2015 FINAL  Final  Gram stain     Status: None   Collection Time: 04/27/15 12:55 PM  Result Value Ref Range Status   Specimen Description FLUID RIGHT PLEURAL  Final   Special Requests NONE  Final   Gram Stain   Final    FEW WBC PRESENT, PREDOMINANTLY MONONUCLEAR NO ORGANISMS SEEN Performed at  [facility]    Report Status 04/27/2015 FINAL  Final    Radiology Reports Dg Chest 1 View  04/26/2015  CLINICAL DATA:  SOB, pt had to be propped up to get semi erect film on stretcher, unable to cooperate or move EXAM: CHEST  1 VIEW COMPARISON:  04/12/2015 FINDINGS: Moderate right pleural effusion, increased from previous exam. New suprahilar airspace consolidation. Coarse interstitial opacities throughout both lungs as before. Heart size upper limits normal for technique. Atheromatous aorta. Pneumothorax. IMPRESSION: 1. Worsening right suprahilar airspace disease and progressive moderate right pleural effusion. Electronically Signed   By: Corlis Leak  Hassell M.D.   On: 04/26/2015 12:44   Dg Chest 2 View  05/01/2015  CLINICAL DATA:  Weakness, loss of appetite, recent dx of pneumonia. Pts daughter stated thoracentesis performed also while admitted. History of A-FIB, coronary artery disease EXAM: CHEST  2 VIEW COMPARISON:  05/01/2015 at 1647 hours FINDINGS: Opacity at the right lung base is similar to the earlier exam. This is consistent with combination of pleural fluid with either atelectasis or pneumonia or combination. There is central and lung base bronchial wall thickening that may reflect chronic bronchitic change. There is no convincing pulmonary edema. There is a small left pleural effusion. Cardiac silhouette is mildly enlarged. No mediastinal or hilar masses or convincing adenopathy. Bony thorax is diffusely demineralized. IMPRESSION: 1. Right pleural effusion associated with right lung base opacity which may reflect atelectasis or pneumonia. Atelectasis is favored. 2. Small left pleural effusion. 3. No convincing pulmonary edema.  Mild cardiomegaly. Electronically Signed   By: Amie Portlandavid  Ormond M.D.   On: 05/01/2015 20:43   Dg Chest 2 View  04/12/2015  CLINICAL DATA:  Shortness of breath, cough, fever EXAM: CHEST  2 VIEW COMPARISON:  10/22/2013 FINDINGS: Cardiopericardial enlargement with aortic  tortuosity. There are small bilateral pleural effusions, greater on the right. No definitive pulmonary edema. No pneumonia, collapse, or air leak. Chronic appearing T8 compression fracture with advanced height loss. Remote T12 compression fracture with vertebroplasty. IMPRESSION: Cardiomegaly and small pleural effusions. Electronically Signed   By: Marnee Spring M.D.   On: 04/12/2015 21:21   Dg Chest Port 1 View  05/01/2015  CLINICAL DATA:  81 year old who was discharged from the hospital 2 days ago with pneumonia, presenting back from the nursing home with generalized weakness and anorexia. EXAM: PORTABLE CHEST 1 VIEW COMPARISON:  04/27/2015 and earlier. FINDINGS: Cardiac silhouette moderately enlarged, unchanged. Elevation of the right hemidiaphragm with likely subpulmonic right pleural effusion, unchanged since the examination 5 days ago. Consolidation in the right lower lobe and right middle lobe, unchanged. No new pulmonary parenchymal abnormalities. IMPRESSION: 1. Likely subpulmonic right pleural effusion, unchanged since examination 5 days ago. 2. Associated dense atelectasis and/or pneumonia in the right lower lobe and right middle lobe, also unchanged. 3. No new abnormalities. Electronically Signed   By: Hulan Saas M.D.   On: 05/01/2015 17:05   Dg Chest Port 1 View  04/27/2015  CLINICAL DATA:  Post RIGHT thoracentesis EXAM: PORTABLE CHEST 1 VIEW COMPARISON:  Portable exam 1314 hours compared to earlier study of 0652 hours FINDINGS: Decrease in RIGHT pleural effusion and basilar atelectasis post thoracentesis. No pneumothorax. Heart remains enlarged with pulmonary vascular congestion. Atherosclerotic calcification aorta. Central peribronchial thickening. LEFT lung clear. Bones demineralized. IMPRESSION: Decrease in RIGHT pleural effusion and basilar atelectasis post thoracentesis without pneumothorax. Enlargement of cardiac silhouette with pulmonary vascular congestion. Electronically Signed    By: Ulyses Southward M.D.   On: 04/27/2015 13:34   Dg Chest Port 1 View  04/27/2015  CLINICAL DATA:  Shortness of breath, healthcare associated pneumonia, sepsis EXAM: PORTABLE CHEST 1 VIEW COMPARISON:  Portable chest x-ray of April 26, 2015 FINDINGS: The patient is rotated on this study. There is been interval deterioration in the appearance of the right hemithorax with diffusely increased interstitial markings. There is a right pleural effusion layering posteriorly. The left lung is clear. The heart is mildly enlarged where visualized but the pulmonary vascularity is not engorged. The observed bony thorax exhibits no acute abnormality. IMPRESSION: Slight interval deterioration in the appearance of the right hemithorax where there is interstitial infiltrate as well as a moderate-sized right pleural effusion. Electronically Signed   By: David  [place] M.D.   On: 04/27/2015 07:38   US Abdomen Limited Ruq  04/27/2015  CLINICAL DATA:  Elevated LFTs EXAM: US ABDOMEN LIMITED - RIGHT UPPER QUADRANT COMPARISON:  None. FINDINGS: Gallbladder: The gallbladder is diffusely edematous measuring 5.8 mm in thickness. Negative sonographic Murphy's side. Negative for gallstones. Common bile duct: Diameter: 3.6 mm Liver: No focal lesion identified. Within normal limits in parenchymal echogenicity. IMPRESSION: 1. Gallbladder wall edema measuring 5.8 mm in maximum thickness. Electronically Signed   By: Signa Kell M.D.   On: 04/27/2015 10:32   US Thoracentesis Asp Pleural Space W/img Guide  04/27/2015  CLINICAL DATA:  RIGHT pleural effusion EXAM: ULTRASOUND GUIDED DIAGNOSTIC AND THERAPEUTIC THORACENTESIS COMPARISON:  Chest radiograph 04/27/2015 PROCEDURE: Procedure, benefits, and risks of procedure were discussed with patient. Written informed consent for procedure was obtained. Time out protocol followed. Pleural effusion localized by ultrasound at the posterior RIGHT hemithorax. Skin prepped and draped in usual sterile  fashion. Skin and soft tissues anesthetized with 10 mL of  1% lidocaine. 8 French thoracentesis catheter placed into the RIGHT pleural space. 1000 mL of amber color fluid aspirated by syringe pump. Procedure tolerated well by patient without immediate complication. COMPLICATIONS: None FINDINGS: A total of approximately 1000 mL of RIGHT pleural fluid was removed. A fluid sample of 180 mL wassent for laboratory analysis. IMPRESSION: Successful ultrasound guided RIGHT thoracentesis yielding 1000 mL of pleural fluid. Electronically Signed   By: Ulyses Southward M.D.   On: 04/27/2015 14:21    CBC  Recent Labs Lab 04/28/15 0523 05/01/15 1605 05/02/15 0617  WBC 10.0 11.2* 10.6*  HGB 11.0* 12.4 13.3  HCT 36.8 39.7 41.6  PLT 201 154 146*  MCV 102.8* 105.0* 104.8*  MCH 30.7 32.8 33.5  MCHC 29.9* 31.2 32.0  RDW 22.1* 23.4* 23.5*  LYMPHSABS  --  1.6  --   MONOABS  --  1.3*  --   EOSABS  --  0.1  --   BASOSABS  --  0.0  --     Chemistries   Recent Labs Lab 04/28/15 0523 04/29/15 0458 05/01/15 1605 05/02/15 0617  NA 135 134* 138 140  K 4.2 3.9 4.1 3.5  CL 103 102 103 103  CO2 GLUCOSE 119* 141* 82 85  BUN 32* 34* 30* 27*  CREATININE 0.82 0.67 0.69 0.70  CALCIUM 8.3* 8.1* 8.4* 8.2*  AST 199* 101*  --  45*  ALT 190* 143*  --  66*  ALKPHOS 93 86  --  81  BILITOT 1.4* 1.1  --  2.1*   ------------------------------------------------------------------------------------------------------------------ estimated creatinine clearance is 35 mL/min (by C-G formula based on Cr of 0.7). ------------------------------------------------------------------------------------------------------------------ No results for input(s): HGBA1C in the last 72 hours. ------------------------------------------------------------------------------------------------------------------ No results for input(s): CHOL, HDL, LDLCALC, TRIG, CHOLHDL, LDLDIRECT in the last 72  hours. ------------------------------------------------------------------------------------------------------------------ No results for input(s): TSH, T4TOTAL, T3FREE, THYROIDAB in the last 72 hours.  Invalid input(s): FREET3 ------------------------------------------------------------------------------------------------------------------ No results for input(s): VITAMINB12, FOLATE, FERRITIN, TIBC, IRON, RETICCTPCT in the last 72 hours.  Coagulation profile No results for input(s): INR, PROTIME in the last 168 hours.  No results for input(s): DDIMER in the last 72 hours.  Cardiac Enzymes  Recent Labs Lab 05/01/15 1605 05/02/15 0617  TROPONINI 0.04* 0.03   ------------------------------------------------------------------------------------------------------------------ Invalid input(s): POCBNP  No results for input(s): GLUCAP in the last 72 hours.   RAI,RIPUDEEP M.D. [facility] 05/04/2015, 10:00 AM  Pager: [phone] Between 7am to 7pm - call Pager - [phone]  After 7pm go to [email or web address] - password [email or web address]  Call night coverage person covering after 7pm

## 2015-05-04 NOTE — [place] FL2 (Deleted)
[place] MEDICAID FL2 LEVEL OF CARE SCREENING TOOL     IDENTIFICATION  Patient Name: Kim [name] Birthdate: September 30, 1917 Sex: female Admission Date (Current Location): 05/01/2015  [facility] [place] Number:  [name] )   Facility and Address:  [facility],  [address] [place] [address]      Provider Number: [id number]  Attending Physician Name and Address:  Cathren Harsh, MD  Relative Name and Phone Number:       Current Level of Care: Hospital Recommended Level of Care: Assisted Living Facility [facility] with [facility]) Prior Approval Number:    Date Approved/Denied:   PASRR Number: [id number]  Discharge Plan:  [name] with [facility])    Current Diagnoses: Patient Active Problem List   Diagnosis Date Noted  . Failure to thrive in adult 05/01/2015  . Transaminitis 05/01/2015  . Hepatic congestion 05/01/2015  . HCAP (healthcare-associated pneumonia) 04/26/2015  . Pleural effusion on right 04/26/2015  . Hyponatremia 04/26/2015  . Sepsis (HCC) 04/13/2015  . Anemia of chronic disease 04/13/2015  . UTI (urinary tract infection) 04/12/2015  . URI, acute 04/12/2015  . Cellulitis of left lower extremity 04/18/2014  . Cellulitis and abscess of leg, except foot 04/18/2014  . Femur fracture (HCC) 02/04/2014  . Unspecified constipation 02/03/2014  . Unspecified hereditary and idiopathic peripheral neuropathy 02/03/2014  . S/p left hip fracture 11/12/2013  . Herniated disc 11/12/2013  . Pressure ulcer, heel(707.07) 10/30/2013  . Right leg weakness 10/30/2013  . Acute blood loss anemia 10/26/2013  . Thrombocytopenia, unspecified (HCC) 10/26/2013  . Right hip pain 10/26/2013  . Subtrochanteric fracture of left femur (HCC) 10/25/2013  . Normocytic anemia 10/23/2013  . Femur fracture, left (HCC) 10/22/2013  . Fracture of hip, left, closed (HCC) 10/22/2013  . Knee pain, right 02/01/2012  . Unsteady gait 10/14/2010  .  FIBRILLATION, ATRIAL 08/18/2006  . DETACHMENT, RETINAL NOS 05/22/2006  . OSTEOPOROSIS 05/22/2006  . Fracture, thoracic vertebra, compression (HCC) 05/22/2006    Orientation ACTIVITIES/SOCIAL BLADDER RESPIRATION       Family supportive Incontinent Normal  BEHAVIORAL SYMPTOMS/MOOD NEUROLOGICAL BOWEL NUTRITION STATUS      Incontinent  (Regular)  PHYSICIAN VISITS COMMUNICATION OF NEEDS Height & Weight Skin    Verbally 5' (152.4 cm) 140 lbs. Normal          AMBULATORY STATUS RESPIRATION    Assist extensive Normal      Personal Care Assistance Level of Assistance  Bathing, Dressing Bathing Assistance: Maximum assistance Feeding assistance: Maximum assistance Dressing Assistance: Maximum assistance      Functional Limitations Info  Sight Sight Info: Impaired           SPECIAL CARE FACTORS FREQUENCY                      Additional Factors Info  Code Status, Allergies Code Status Info:  (DNR) Allergies Info:  (Nitrofurantoin)           Current Medications (05/04/2015): Current Facility-Administered Medications  Medication Dose Route Frequency Provider Last Rate Last Dose  . acetaminophen (TYLENOL) tablet 500 mg  500 mg Oral Daily Alberteen Sam, MD   500 mg at 05/04/15 0932  . amoxicillin-clavulanate (AUGMENTIN) 500-125 MG per tablet 500 mg  1 tablet Oral 3 times per day Henderson Cloud, MD   500 mg at 05/04/15 0520  . carvedilol (COREG) tablet 12.5 mg  12.5 mg Oral BID WC Ripudeep Jenna Luo, MD   12.5  mg at 05/04/15 0931  . docusate sodium (COLACE) capsule 200 mg  200 mg Oral QHS Alberteen Sam, MD   200 mg at 05/03/15 2145  . enoxaparin (LOVENOX) injection 40 mg  40 mg Subcutaneous Q24H Alberteen Sam, MD   40 mg at 05/03/15 2146  . feeding supplement (ENSURE ENLIVE) (ENSURE ENLIVE) liquid 237 mL  237 mL Oral BID Alberteen Sam, MD   237 mL at 05/04/15 0934  . levothyroxine (SYNTHROID, LEVOTHROID) tablet 50 mcg  50 mcg Oral  QAC breakfast Alberteen Sam, MD   50 mcg at 05/04/15 0932  . senna-docusate (Senokot-S) tablet 1 tablet  1 tablet Oral BID Alberteen Sam, MD   1 tablet at 05/04/15 0932  . sodium chloride 0.9 % injection 3 mL  3 mL Intravenous Q12H Alberteen Sam, MD   3 mL at 05/04/15 0938  . traZODone (DESYREL) tablet 25 mg  25 mg Oral QHS PRN Alberteen Sam, MD       Do not use this list as official medication orders. Please verify with discharge summary.  Discharge Medications:   Medication List    STOP taking these medications        gabapentin 100 MG capsule  Commonly known as:  NEURONTIN      TAKE these medications        acetaminophen 500 MG tablet  Commonly known as:  TYLENOL  Take 500 mg by mouth daily.     amoxicillin-clavulanate 500-125 MG tablet  Commonly known as:  AUGMENTIN  Take 1 tablet (500 mg total) by mouth every 8 (eight) hours. X 2 more doses     ARTIFICIAL TEAR OP  Place 1 drop into both eyes 2 (two) times daily.     CALTRATE 600 PO  Take 1 capsule by mouth every morning.     carvedilol 12.5 MG tablet  Commonly known as:  COREG  Take 1 tablet (12.5 mg total) by mouth 2 (two) times daily with a meal.     cetirizine 10 MG tablet  Commonly known as:  ZYRTEC  Take 10 mg by mouth daily.     DSS 100 MG Caps  Take 100 mg by mouth 2 (two) times daily.     ENSURE  Take 237 mLs by mouth 2 (two) times daily. Strawberry flavored     fluticasone 50 MCG/ACT nasal spray  Commonly known as:  FLONASE  Place 1 spray into both nostrils 2 (two) times daily.     HYDROcodone-acetaminophen 5-325 MG tablet  Commonly known as:  NORCO/VICODIN  Take 1 tablet by mouth every 12 (twelve) hours as needed for severe pain.     levothyroxine 50 MCG tablet  Commonly known as:  SYNTHROID, LEVOTHROID  Take 50 mcg by mouth daily before breakfast.     multivitamin with minerals tablet  Take 1 tablet by mouth every morning.     ondansetron 4 MG tablet   Commonly known as:  ZOFRAN  Take 1 tablet (4 mg total) by mouth every 6 (six) hours as needed for nausea.     PATADAY 0.2 % Soln  Generic drug:  Olopatadine HCl  Apply 1 drop to eye daily.     PROAIR HFA 108 (90 BASE) MCG/ACT inhaler  Generic drug:  albuterol  Inhale 2 puffs into the lungs 3 (three) times daily.     SENEXON-S 8.6-50 MG tablet  Generic drug:  senna-docusate  Take 1 tablet by mouth 2 (two) times daily.  traZODone 50 MG tablet  Commonly known as:  DESYREL  Take 25 mg by mouth at bedtime as needed for sleep.     vitamin C 500 MG tablet  Commonly known as:  ASCORBIC ACID  Take 500 mg by mouth daily.        Relevant Imaging Results:  Relevant Lab Results:  Recent Labs    Additional Information    Annice NeedySettle, Consuelo Suthers D, LCSW [phone]

## 2015-05-04 NOTE — Progress Notes (Signed)
OT Cancellation Note  Patient Details Name: Kim EldersMary M [name] MRN: [id number] DOB: 1917/08/11   Cancelled Treatment:     Reason evaluation not completed: Chart reviewed, PT consulted. Pt is from ALF and requires max to total care for ADL needs and max/total assist for functional transfers. Rehab potential is poor for this pt. If ALF is unable to meet her needs, recommend transferring to long term care facility.  Per MD note palliative care consultation is being requested.  Will discharge OT orders.   Ezra SitesLeslie Yamili Lichtenwalner, OTR/L  [phone]  05/04/2015, 8:02 AM

## 2015-05-04 NOTE — Clinical Social Work Note (Signed)
Clinical Social Work Assessment  Patient Details  Name: Kim [name] MRN: [id number] Date of Birth: 03/22/1918  Date of referral:  05/04/15               Reason for consult:  Facility Placement                Permission sought to share information with:    Permission granted to share information::     Name::        Agency::     Relationship::     Contact Information:     Housing/Transportation Living arrangements for the past 2 months:  Assisted Living Facility Source of Information:  Adult Children Patient Interpreter Needed:  None Criminal Activity/Legal Involvement Pertinent to Current Situation/Hospitalization:  No - Comment as needed Significant Relationships:  Adult Children Lives with:  Facility Resident Do you feel safe going back to the place where you live?  Yes Need for family participation in patient care:  No (Coment)  Care giving concerns:  None identified.    Social Worker assessment / plan:  CSW spoke with patient's son, Kim [name].  Mr. Kim [name] advised that [facility] has [facility] this past August.  He stated that patient has used a wheelchair since she fell and broke her leg in April.  Mr. Kim [name] advised that patient's ADL[facility]. CSW discussed the PT recommendation of a long term care facility. He indicated that he had spoke with patient's doctor, who had discussed Hospice with him.  He stated that if [facility] could not [facility], he would [facility], [facility] or [facility].  CSW discussed that based on the fact that patient does not have a skillable need, patient's placement would be private pay as her previous placement had been.  CSW spoke with patient's daughter, Kim [name], advised that the family was interested in Hospice but would consider the facilities mentioned by her brother.  Mrs. Kim [name] was agreeable for her and her brother to meet    Employment status:  Retired Health and safety inspectornsurance information:  Medicare PT Recommendations:   (Long Term Care Facility) Information / Referral to community resources:     Patient/Family's Response to care:  Family is agreeable to long term care facility or hospice.  Patient/Family's Understanding of and Emotional Response to Diagnosis, Current Treatment, and Prognosis: Family verbalizes understanding of patient's diagnosis, treatment and prognosis and as a result wants to consider Hospice Home or Hospice in a long term care facility.   Emotional Assessment Appearance:    Attitude/Demeanor/Rapport:   (Patient was asleep and would not wake up to speak with CSW.) Affect (typically observed):  Unable to Assess Orientation:    Alcohol / Substance use:  Not Applicable Psych involvement (Current and /or in the community):  No (Comment)  Discharge Needs  Concerns to be addressed:  Discharge Planning Concerns Readmission within the last 30 days:  No Current discharge risk:  None Barriers to Discharge:  No Barriers Identified   Kim [name], Kim Valeriano D, LCSW 05/04/2015, 12:00 PM [phone]

## 2015-05-04 NOTE — Progress Notes (Signed)
Discharged [facility] [place] [facility], [facility] via Doctor, general practicestretcher.

## 2015-05-04 NOTE — Care Management Note (Signed)
Case Management Note  Patient Details  Name: Kim [name] MRN: [id number] Date of Birth: Nov 20, 1917  Subjective/Objective:                  [facility] [place]. Pt was receiving PT prior to admission. Pt admitted with FTT in the setting of HCAP. Pt had recently been DC's from hospital. Palliative care has been consulted.   Action/Plan: Pt [facility]. Family has deferred choosing hospice agency to [facility]. [facility] rep has asked that they make the hospice referral. CSW has arranged for return to facility. No CM needs.   Expected Discharge Date:    05/04/2015              Expected Discharge Plan:  Assisted Living / Rest Home  In-House Referral:  Clinical Social Work, Hospice / Palliative Care  Discharge planning Services  CM Consult  Post Acute Care Choice:    Choice offered to:     DME Arranged:    DME Agency:     HH Arranged:    HH Agency:     Status of Service:     Medicare Important Message Given:  Yes-second notification given Date Medicare IM Given:    Medicare IM give by:    Date Additional Medicare IM Given:    Additional Medicare Important Message give by:     If discussed at Long Length of Stay Meetings, dates discussed:    Additional Comments:  Malcolm MetroChildress, Rafi Kenneth Demske, RN 05/04/2015, 1:58 PM

## 2015-05-04 NOTE — Discharge Summary (Signed)
Physician Discharge Summary   Patient ID: Kim [name] MRN: [id number] DOB/AGE: Aug 01, 1917 81 y.o.  Admit date: 05/01/2015 Discharge date: 05/04/2015  Primary Care Physician:  Dwana Melena, MD  Discharge Diagnoses:   . Failure to thrive in adult . FIBRILLATION, ATRIAL . HCAP (healthcare-associated pneumonia) . Pleural effusion on right . Anemia of chronic disease . Hepatic congestion . elevated troponin  Consults: None   Recommendations for Outpatient Follow-up:  Please continue Augmentin 500 mg every 8 hours for 2 more doses  Palliative medicine/hospice to follow at the facility  Aspiration precautions  Coreg increased to 12.5 mg twice a day  TESTS THAT NEED FOLLOW-UP None   DIET: Heart healthy diet    Allergies:   Allergies  Allergen Reactions  . Nitrofurantoin Rash     Discharge Medications:   Medication List    STOP taking these medications        gabapentin 100 MG capsule  Commonly known as:  NEURONTIN      TAKE these medications        acetaminophen 500 MG tablet  Commonly known as:  TYLENOL  Take 500 mg by mouth daily.     amoxicillin-clavulanate 500-125 MG tablet  Commonly known as:  AUGMENTIN  Take 1 tablet (500 mg total) by mouth every 8 (eight) hours. X 2 more doses     ARTIFICIAL TEAR OP  Place 1 drop into both eyes 2 (two) times daily.     CALTRATE 600 PO  Take 1 capsule by mouth every morning.     carvedilol 12.5 MG tablet  Commonly known as:  COREG  Take 1 tablet (12.5 mg total) by mouth 2 (two) times daily with a meal.     cetirizine 10 MG tablet  Commonly known as:  ZYRTEC  Take 10 mg by mouth daily.     DSS 100 MG Caps  Take 100 mg by mouth 2 (two) times daily.     ENSURE  Take 237 mLs by mouth 2 (two) times daily. Strawberry flavored     fluticasone 50 MCG/ACT nasal spray  Commonly known as:  FLONASE  Place 1 spray into both nostrils 2 (two) times daily.     HYDROcodone-acetaminophen 5-325 MG tablet  Commonly  known as:  NORCO/VICODIN  Take 1 tablet by mouth every 12 (twelve) hours as needed for severe pain.     levothyroxine 50 MCG tablet  Commonly known as:  SYNTHROID, LEVOTHROID  Take 50 mcg by mouth daily before breakfast.     multivitamin with minerals tablet  Take 1 tablet by mouth every morning.     ondansetron 4 MG tablet  Commonly known as:  ZOFRAN  Take 1 tablet (4 mg total) by mouth every 6 (six) hours as needed for nausea.     PATADAY 0.2 % Soln  Generic drug:  Olopatadine HCl  Apply 1 drop to eye daily.     PROAIR HFA 108 (90 BASE) MCG/ACT inhaler  Generic drug:  albuterol  Inhale 2 puffs into the lungs 3 (three) times daily.     SENEXON-S 8.6-50 MG tablet  Generic drug:  senna-docusate  Take 1 tablet by mouth 2 (two) times daily.     traZODone 50 MG tablet  Commonly known as:  DESYREL  Take 25 mg by mouth at bedtime as needed for sleep.     vitamin C 500 MG tablet  Commonly known as:  ASCORBIC ACID  Take 500 mg by mouth daily.  Brief H and P: For complete details please refer to admission H and P, but in brief Per Dr. Sheryn Bison admit note, Kim [name] is a 81 y.o. female with a past medical history significant for Afib not on warfarin, hypothyroidism, and recent UTI and then HCAP discharged to ALF two days prior to admission presented with failure to thrive at home/ALF. The patient was admitted in mid October for UTI, discharged on levofloxacin back to her ALF. One week ago, she was readmitted with RLL opacity and parapneumonic effusion, was on vancomycin and ceftazidime, underwent thoracentesis of 1L fluid (appears exudative with negative culture at 5d), and was discharged back to ALF with amoxicillin-clavulanate for five more days. Per patient's daughter, Kim [name], by telephone: the patient returned to ALF on Wednesday, ate dinner in dining room, but had reduced appetite and very low energy. She is normally able to wheel herself around in her  wheelchair, but was unable to do this Wednesday. On Thursday, Kim [name] talked with her by phone and said she was "not talkative" and perhaps confused. Patient's daughter found her profoundly weak and unable to maintain conversation on the day of admission and patient was sent back to the ER.  In the ED, she was tachycardic in Afib, had elevated BUN-creatinine ratio, stable WBC, and unchanged pleural effusion. She was able to answer questions, but very weak, sluggish and couldn't remember where she'd been for the last two days. She reported right sided chest discomfort but no dyspnea or fever or cough. There was minimal TNI elevation. She was given 2L NS and TRH were asked to admit for failure to thrive at home in context of HCAP.  Hospital Course:   Weakness/FTT -Likely related to recent infections in this elderly patient.  - Patient was seen by physical therapy and noted significant knee flexion contractures which makes transfers or gait impossible, patient is a max/total assist to transfer to a chair, given the situation rehabilitation potential is poor, patient will need to transition to long-term care nursing home if ALF unable to manage her needs.  -Palliative care was also consulted for goals of care, hospice should follow at the facility per the social work.   Recent HCAP  -No fevers, leukocytosis, no need of IV antibiotics at this time -Continue planned course of Augmentin, will after 2 more doses  Chronic Diastolic CHF  - currently compensated, I's and O's euvolemic   Atrial Fibrillation  -Rates in the low 100s, increased Coreg to 12.5 mg twice a day -Not a candidate for anticoagulation due to risk of falls, failure to thrive   Hypothyroidism  -Continue synthroid.   Elevated Troponin  -Flat, of uncertain value, but no further work up in this elderly patient (Dr Ardyth Harps discussed with family at bedside).      Day of Discharge BP 158/95 mmHg  Pulse 108  Temp(Src) 98 F  (36.7 C) (Oral)  Resp 16  Ht 5' (1.524 m)  Wt 63.504 kg (140 lb)  BMI 27.34 kg/m2  SpO2 91%  Physical Exam: General: Alert and awake oriented x2 not in any acute distress. HEENT: anicteric sclera, pupils reactive to light and accommodation CVS: S1-S2 clear no murmur rubs or gallops Chest: clear to auscultation bilaterally, no wheezing rales or rhonchi Abdomen: soft nontender, nondistended, normal bowel sounds Extremities: no cyanosis, clubbing or edema noted bilaterally Neuro: Cranial nerves II-XII intact, no focal neurological deficits   The results of significant diagnostics from this hospitalization (including imaging, microbiology, ancillary and laboratory) are  listed below for reference.    LAB RESULTS: Basic Metabolic Panel:  Recent Labs Lab 05/01/15 1605 05/02/15 0617  NA 138 140  K 4.1 3.5  CL 103 103  CO2 28 27  GLUCOSE 82 85  BUN 30* 27*  CREATININE 0.69 0.70  CALCIUM 8.4* 8.2*   Liver Function Tests:  Recent Labs Lab 04/29/15 0458 05/02/15 0617  AST 101* 45*  ALT 143* 66*  ALKPHOS 86 81  BILITOT 1.1 2.1*  PROT 5.6* 5.8*  ALBUMIN 2.4* 2.6*   No results for input(s): LIPASE, AMYLASE in the last 168 hours. No results for input(s): AMMONIA in the last 168 hours. CBC:  Recent Labs Lab 05/01/15 1605 05/02/15 0617  WBC 11.2* 10.6*  NEUTROABS 8.3*  --   HGB 12.4 13.3  HCT 39.7 41.6  MCV 105.0* 104.8*  PLT 154 146*   Cardiac Enzymes:  Recent Labs Lab 05/01/15 1605 05/02/15 0617  TROPONINI 0.04* 0.03   BNP: Invalid input(s): POCBNP CBG: No results for input(s): GLUCAP in the last 168 hours.  Significant Diagnostic Studies:  Dg Chest 2 View  05/01/2015  CLINICAL DATA:  Weakness, loss of appetite, recent dx of pneumonia. Pts daughter stated thoracentesis performed also while admitted. History of A-FIB, coronary artery disease EXAM: CHEST  2 VIEW COMPARISON:  05/01/2015 at 1647 hours FINDINGS: Opacity at the right lung base is similar to  the earlier exam. This is consistent with combination of pleural fluid with either atelectasis or pneumonia or combination. There is central and lung base bronchial wall thickening that may reflect chronic bronchitic change. There is no convincing pulmonary edema. There is a small left pleural effusion. Cardiac silhouette is mildly enlarged. No mediastinal or hilar masses or convincing adenopathy. Bony thorax is diffusely demineralized. IMPRESSION: 1. Right pleural effusion associated with right lung base opacity which may reflect atelectasis or pneumonia. Atelectasis is favored. 2. Small left pleural effusion. 3. No convincing pulmonary edema.  Mild cardiomegaly. Electronically Signed   By: Amie Portlandavid  Ormond M.D.   On: 05/01/2015 20:43   Dg Chest Port 1 View  05/01/2015  CLINICAL DATA:  81 year old who was discharged from the hospital 2 days ago with pneumonia, presenting back from the nursing home with generalized weakness and anorexia. EXAM: PORTABLE CHEST 1 VIEW COMPARISON:  04/27/2015 and earlier. FINDINGS: Cardiac silhouette moderately enlarged, unchanged. Elevation of the right hemidiaphragm with likely subpulmonic right pleural effusion, unchanged since the examination 5 days ago. Consolidation in the right lower lobe and right middle lobe, unchanged. No new pulmonary parenchymal abnormalities. IMPRESSION: 1. Likely subpulmonic right pleural effusion, unchanged since examination 5 days ago. 2. Associated dense atelectasis and/or pneumonia in the right lower lobe and right middle lobe, also unchanged. 3. No new abnormalities. Electronically Signed   By: Hulan Saashomas  Lawrence M.D.   On: 05/01/2015 17:05    2D ECHO:   Disposition and Follow-up:     Discharge Instructions    Diet - low sodium heart healthy    Complete by:  As directed      Increase activity slowly    Complete by:  As directed             DISPOSITION: [place] facility with hospice   DISCHARGE FOLLOW-UP Follow-up Information     Follow up with Dwana MelenaZack Hall, MD On 05/14/2015.   Specialty:  Internal Medicine   Why:  at 4pm for hospital follow-up   Contact information:   [address] [place] [address] [phone]  Time spent on Discharge: 35 minutes  Signed:   Enriqueta Augusta M.D. [name] 05/04/2015, 1:13 PM Pager: [phone]

## 2015-05-06 LAB — LACTATE DEHYDROGENASE, PLEURAL OR PERITONEAL FLUID

## 2015-05-28 DEATH — deceased

## 2015-11-30 IMAGING — CR DG PORTABLE PELVIS
1 series · 1 of 1 positions shown · non-contrast
Comparison: CT, 09/29/2012.

CLINICAL DATA: Right hip pain.

EXAM:
PORTABLE PELVIS 1-2 VIEWS

[ap]
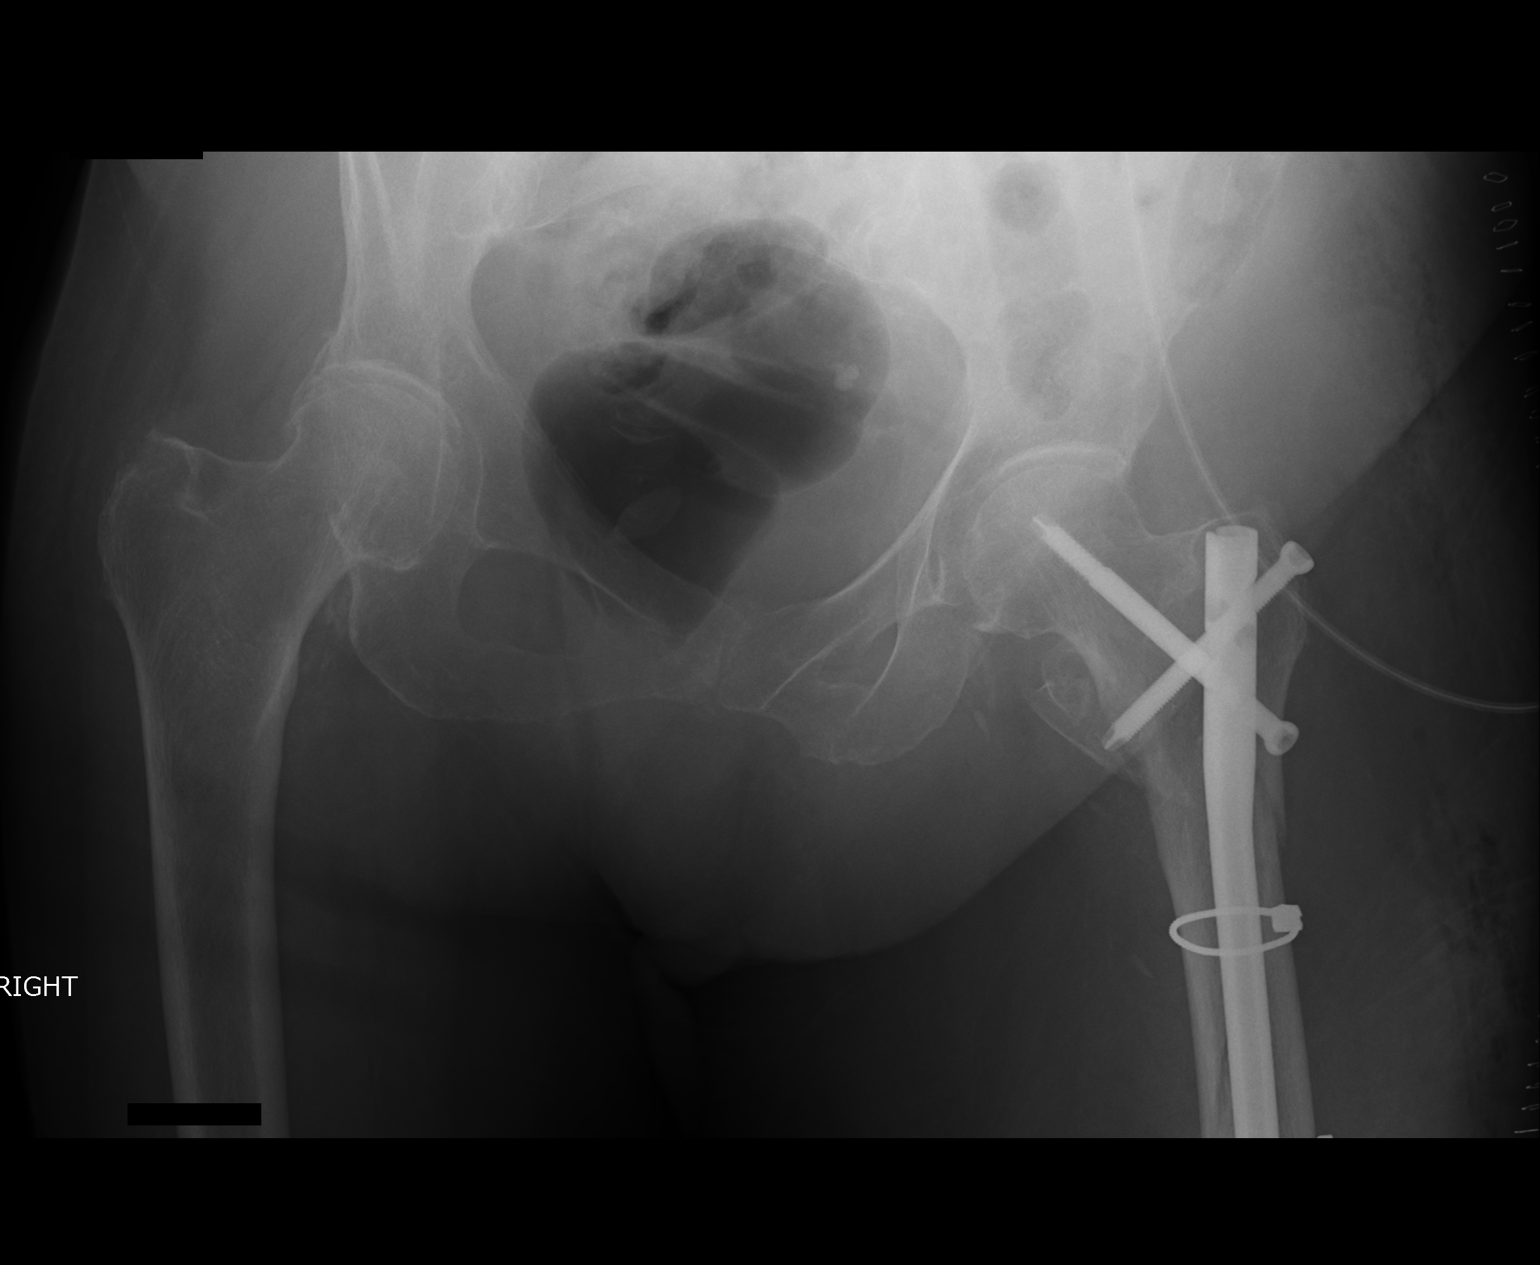

[1 of 1 positions shown; findings below may reference images not displayed]

FINDINGS: No acute fracture or dislocation.

Recent left intertrochanteric hip fracture has been reduced with
screws and an intra medullary rod. Orthopedic hardware is
well-seated. Major fracture fragments are well aligned. This is new
from the prior CT.

Hip joints are normally aligned. There are small osteophytes from
the base of the right femoral head. No other arthropathic change.

Bones are diffusely demineralized. The soft tissues show femoral
vascular calcifications. There is air in the soft tissues underneath
surgical staples from the recent left hip surgery.
IMPRESSION: 1. No new fracture or dislocation.
2. ORIF of a left intertrochanteric hip fracture, performed
recently.

## 2017-05-16 IMAGING — DX DG CHEST 2V
2 series · 2 of 2 positions shown · non-contrast
Comparison: 10/22/2013

CLINICAL DATA: Shortness of breath, cough, fever

EXAM:
CHEST  2 VIEW

[chest lat]
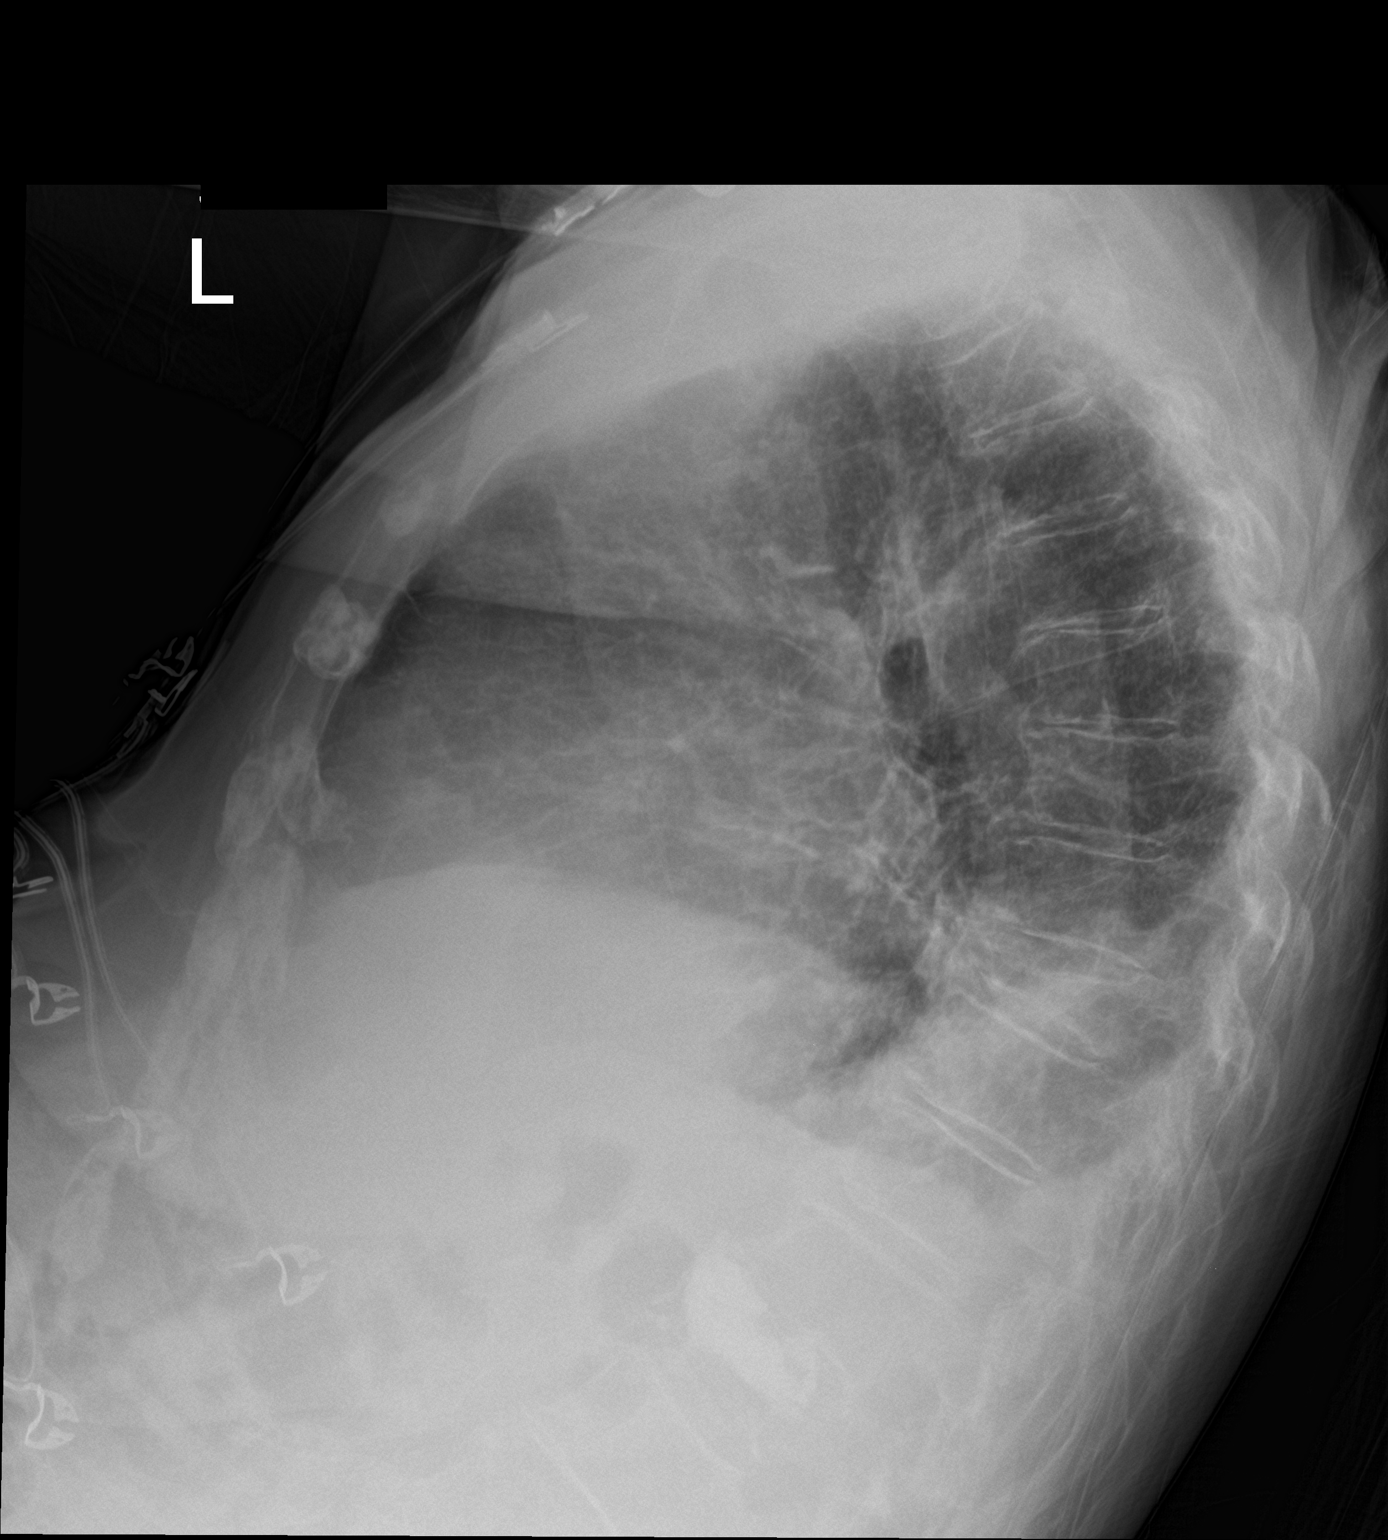

[chest ap]
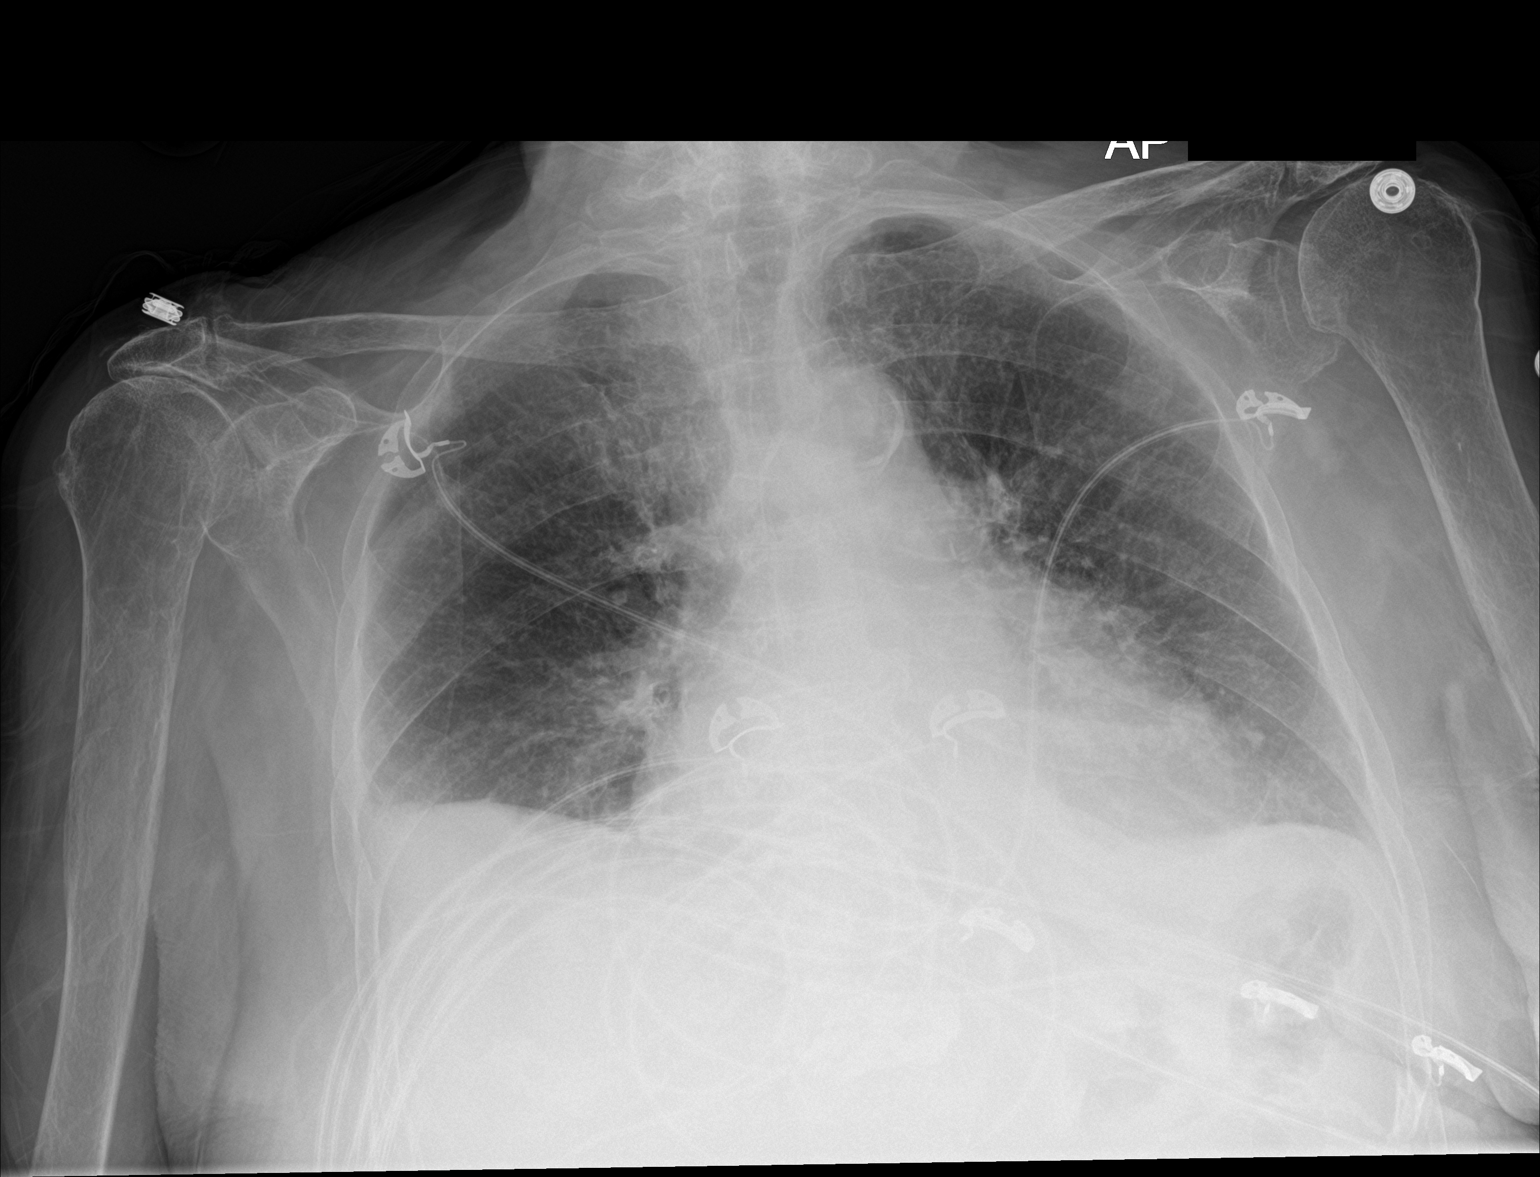

[2 of 2 positions shown; findings below may reference images not displayed]

FINDINGS: Cardiopericardial enlargement with aortic tortuosity. There are
small bilateral pleural effusions, greater on the right. No
definitive pulmonary edema. No pneumonia, collapse, or air leak.
Chronic appearing T8 compression fracture with advanced height loss.
Remote T12 compression fracture with vertebroplasty.
IMPRESSION: Cardiomegaly and small pleural effusions.
